# Patient Record
Sex: Male | Born: 1960 | Race: White | Hispanic: No | Marital: Married | Smoking: Never smoker
Health system: Southern US, Community
[De-identification: ages and names within clinical notes are randomized; demographics above are authoritative.]

## PROBLEM LIST (undated history)

## (undated) DIAGNOSIS — J45909 Unspecified asthma, uncomplicated: Secondary | ICD-10-CM

## (undated) DIAGNOSIS — M7918 Myalgia, other site: Secondary | ICD-10-CM

## (undated) DIAGNOSIS — T7840XA Allergy, unspecified, initial encounter: Secondary | ICD-10-CM

## (undated) DIAGNOSIS — L4050 Arthropathic psoriasis, unspecified: Secondary | ICD-10-CM

## (undated) DIAGNOSIS — G4700 Insomnia, unspecified: Secondary | ICD-10-CM

## (undated) DIAGNOSIS — E785 Hyperlipidemia, unspecified: Secondary | ICD-10-CM

## (undated) DIAGNOSIS — M1990 Unspecified osteoarthritis, unspecified site: Secondary | ICD-10-CM

## (undated) DIAGNOSIS — N419 Inflammatory disease of prostate, unspecified: Secondary | ICD-10-CM

## (undated) DIAGNOSIS — G43909 Migraine, unspecified, not intractable, without status migrainosus: Secondary | ICD-10-CM

## (undated) DIAGNOSIS — H269 Unspecified cataract: Secondary | ICD-10-CM

## (undated) DIAGNOSIS — K219 Gastro-esophageal reflux disease without esophagitis: Secondary | ICD-10-CM

## (undated) DIAGNOSIS — Z860100 Personal history of colon polyps, unspecified: Secondary | ICD-10-CM

## (undated) DIAGNOSIS — F419 Anxiety disorder, unspecified: Secondary | ICD-10-CM

## (undated) DIAGNOSIS — M545 Low back pain, unspecified: Secondary | ICD-10-CM

## (undated) DIAGNOSIS — G2581 Restless legs syndrome: Secondary | ICD-10-CM

## (undated) DIAGNOSIS — F411 Generalized anxiety disorder: Secondary | ICD-10-CM

## (undated) HISTORY — DX: Unspecified osteoarthritis, unspecified site: M19.90

## (undated) HISTORY — DX: Arthropathic psoriasis, unspecified: L40.50

## (undated) HISTORY — DX: Personal history of colon polyps, unspecified: Z86.0100

## (undated) HISTORY — DX: Insomnia, unspecified: G47.00

## (undated) HISTORY — PX: UPPER GASTROINTESTINAL ENDOSCOPY: SHX188

## (undated) HISTORY — DX: Personal history of colonic polyps: Z86.010

## (undated) HISTORY — DX: Low back pain: M54.5

## (undated) HISTORY — DX: Allergy, unspecified, initial encounter: T78.40XA

## (undated) HISTORY — DX: Migraine, unspecified, not intractable, without status migrainosus: G43.909

## (undated) HISTORY — DX: Low back pain, unspecified: M54.50

## (undated) HISTORY — DX: Gastro-esophageal reflux disease without esophagitis: K21.9

## (undated) HISTORY — DX: Inflammatory disease of prostate, unspecified: N41.9

## (undated) HISTORY — DX: Unspecified cataract: H26.9

## (undated) HISTORY — DX: Hyperlipidemia, unspecified: E78.5

## (undated) HISTORY — PX: COLONOSCOPY: SHX174

## (undated) HISTORY — DX: Restless legs syndrome: G25.81

## (undated) HISTORY — DX: Anxiety disorder, unspecified: F41.9

## (undated) HISTORY — DX: Generalized anxiety disorder: F41.1

## (undated) HISTORY — PX: OTHER SURGICAL HISTORY: SHX169

## (undated) HISTORY — DX: Myalgia, other site: M79.18

---
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
## 2002-03-16 ENCOUNTER — Encounter: Payer: Self-pay | Admitting: Urology

## 2002-03-16 ENCOUNTER — Encounter: Admission: RE | Admit: 2002-03-16 | Discharge: 2002-03-16 | Payer: Self-pay | Admitting: Urology

## 2002-10-26 ENCOUNTER — Encounter: Payer: Self-pay | Admitting: Gastroenterology

## 2002-10-26 ENCOUNTER — Ambulatory Visit (HOSPITAL_COMMUNITY): Admission: RE | Admit: 2002-10-26 | Discharge: 2002-10-26 | Payer: Self-pay | Admitting: Gastroenterology

## 2004-07-06 ENCOUNTER — Ambulatory Visit: Payer: Self-pay | Admitting: Pulmonary Disease

## 2004-07-29 ENCOUNTER — Ambulatory Visit: Payer: Self-pay | Admitting: Pulmonary Disease

## 2004-11-17 ENCOUNTER — Ambulatory Visit: Payer: Self-pay | Admitting: Pulmonary Disease

## 2004-12-04 ENCOUNTER — Ambulatory Visit: Payer: Self-pay | Admitting: Internal Medicine

## 2005-07-29 ENCOUNTER — Ambulatory Visit: Payer: Self-pay | Admitting: Pulmonary Disease

## 2005-10-01 ENCOUNTER — Ambulatory Visit: Payer: Self-pay | Admitting: Pulmonary Disease

## 2006-09-12 ENCOUNTER — Ambulatory Visit: Payer: Self-pay | Admitting: Pulmonary Disease

## 2006-09-23 ENCOUNTER — Ambulatory Visit: Payer: Self-pay | Admitting: Cardiovascular Disease

## 2006-12-08 ENCOUNTER — Ambulatory Visit: Payer: Self-pay | Admitting: Gastroenterology

## 2006-12-14 ENCOUNTER — Ambulatory Visit: Payer: Self-pay | Admitting: Gastroenterology

## 2006-12-27 ENCOUNTER — Ambulatory Visit: Payer: Self-pay | Admitting: Gastroenterology

## 2006-12-29 ENCOUNTER — Ambulatory Visit: Payer: Self-pay | Admitting: Gastroenterology

## 2007-01-26 ENCOUNTER — Ambulatory Visit: Payer: Self-pay | Admitting: Gastroenterology

## 2007-02-08 ENCOUNTER — Ambulatory Visit: Payer: Self-pay | Admitting: Internal Medicine

## 2007-02-08 LAB — CONVERTED CEMR LAB
ALT: 30 units/L (ref 0–53)
AST: 27 units/L (ref 0–37)
Albumin: 4.5 g/dL (ref 3.5–5.2)
Alkaline Phosphatase: 59 units/L (ref 39–117)
BUN: 13 mg/dL (ref 6–23)
Basophils Absolute: 0 10*3/uL (ref 0.0–0.1)
Basophils Relative: 0.5 % (ref 0.0–1.0)
Bilirubin Urine: NEGATIVE
Bilirubin, Direct: 0.1 mg/dL (ref 0.0–0.3)
CO2: 31 meq/L (ref 19–32)
Calcium: 9.6 mg/dL (ref 8.4–10.5)
Chloride: 104 meq/L (ref 96–112)
Cholesterol: 230 mg/dL (ref 0–200)
Creatinine, Ser: 0.9 mg/dL (ref 0.4–1.5)
Direct LDL: 176.4 mg/dL
Eosinophils Absolute: 0.4 10*3/uL (ref 0.0–0.6)
Eosinophils Relative: 7.3 % — ABNORMAL HIGH (ref 0.0–5.0)
GFR calc Af Amer: 117 mL/min
GFR calc non Af Amer: 97 mL/min
Glucose, Bld: 107 mg/dL — ABNORMAL HIGH (ref 70–99)
HCT: 46 % (ref 39.0–52.0)
HDL: 52.2 mg/dL (ref >39.0)
Hemoglobin, Urine: NEGATIVE
Hemoglobin: 16.1 g/dL (ref 13.0–17.0)
Ketones, ur: NEGATIVE mg/dL
Leukocytes, UA: NEGATIVE
Lymphocytes Relative: 37.7 % (ref 12.0–46.0)
MCHC: 34.9 g/dL (ref 30.0–36.0)
MCV: 91.6 fL (ref 78.0–100.0)
Monocytes Absolute: 0.3 10*3/uL (ref 0.2–0.7)
Monocytes Relative: 6.3 % (ref 3.0–11.0)
Neutro Abs: 2.7 10*3/uL (ref 1.4–7.7)
Neutrophils Relative %: 48.2 % (ref 43.0–77.0)
Nitrite: NEGATIVE
PSA: 1.31 ng/mL (ref 0.10–4.00)
Platelets: 224 10*3/uL (ref 150–400)
Potassium: 4 meq/L (ref 3.5–5.1)
RBC: 5.02 M/uL (ref 4.22–5.81)
RDW: 11.9 % (ref 11.5–14.6)
Sodium: 141 meq/L (ref 135–145)
Specific Gravity, Urine: 1.01 (ref 1.000–1.03)
TSH: 1.57 microintl units/mL (ref 0.35–5.50)
Total Bilirubin: 0.9 mg/dL (ref 0.3–1.2)
Total CHOL/HDL Ratio: 4.4
Total Protein, Urine: NEGATIVE mg/dL
Total Protein: 7.3 g/dL (ref 6.0–8.3)
Triglycerides: 65 mg/dL (ref 0–149)
Urine Glucose: NEGATIVE mg/dL
Urobilinogen, UA: 0.2 (ref 0.0–1.0)
VLDL: 13 mg/dL (ref 0–40)
WBC: 5.5 10*3/uL (ref 4.5–10.5)
pH: 6.5 (ref 5.0–8.0)

## 2007-02-23 ENCOUNTER — Ambulatory Visit: Payer: Self-pay | Admitting: Gastroenterology

## 2007-03-07 ENCOUNTER — Encounter: Payer: Self-pay | Admitting: Gastroenterology

## 2007-03-09 ENCOUNTER — Ambulatory Visit: Payer: Self-pay | Admitting: Gastroenterology

## 2007-03-22 ENCOUNTER — Encounter: Payer: Self-pay | Admitting: Internal Medicine

## 2007-03-22 ENCOUNTER — Ambulatory Visit: Payer: Self-pay | Admitting: Internal Medicine

## 2007-03-22 DIAGNOSIS — E785 Hyperlipidemia, unspecified: Secondary | ICD-10-CM | POA: Insufficient documentation

## 2007-03-22 DIAGNOSIS — M545 Low back pain, unspecified: Secondary | ICD-10-CM | POA: Insufficient documentation

## 2007-03-22 DIAGNOSIS — E7800 Pure hypercholesterolemia, unspecified: Secondary | ICD-10-CM | POA: Insufficient documentation

## 2007-03-22 DIAGNOSIS — F411 Generalized anxiety disorder: Secondary | ICD-10-CM

## 2007-03-22 HISTORY — DX: Generalized anxiety disorder: F41.1

## 2007-05-02 ENCOUNTER — Ambulatory Visit: Payer: Self-pay | Admitting: Internal Medicine

## 2007-05-02 LAB — CONVERTED CEMR LAB
ALT: 34 units/L (ref 0–53)
AST: 28 units/L (ref 0–37)
Albumin: 4.2 g/dL (ref 3.5–5.2)
Alkaline Phosphatase: 49 units/L (ref 39–117)
Bilirubin, Direct: 0.1 mg/dL (ref 0.0–0.3)
Cholesterol: 173 mg/dL (ref 0–200)
HDL: 54.5 mg/dL (ref >39.0)
LDL Cholesterol: 106 mg/dL — ABNORMAL HIGH (ref 0–99)
Total Bilirubin: 1.1 mg/dL (ref 0.3–1.2)
Total CHOL/HDL Ratio: 3.2
Total Protein: 6.8 g/dL (ref 6.0–8.3)
Triglycerides: 61 mg/dL (ref 0–149)
VLDL: 12 mg/dL (ref 0–40)

## 2007-05-09 ENCOUNTER — Telehealth (INDEPENDENT_AMBULATORY_CARE_PROVIDER_SITE_OTHER): Payer: Self-pay | Admitting: *Deleted

## 2007-05-10 ENCOUNTER — Ambulatory Visit: Payer: Self-pay | Admitting: Internal Medicine

## 2007-05-23 ENCOUNTER — Ambulatory Visit: Payer: Self-pay | Admitting: Gastroenterology

## 2007-06-15 HISTORY — PX: OTHER SURGICAL HISTORY: SHX169

## 2007-06-26 ENCOUNTER — Encounter: Payer: Self-pay | Admitting: Internal Medicine

## 2007-06-30 ENCOUNTER — Encounter (INDEPENDENT_AMBULATORY_CARE_PROVIDER_SITE_OTHER): Payer: Self-pay | Admitting: *Deleted

## 2007-06-30 ENCOUNTER — Telehealth (INDEPENDENT_AMBULATORY_CARE_PROVIDER_SITE_OTHER): Payer: Self-pay | Admitting: *Deleted

## 2007-07-20 ENCOUNTER — Ambulatory Visit: Payer: Self-pay | Admitting: Internal Medicine

## 2007-07-21 ENCOUNTER — Telehealth (INDEPENDENT_AMBULATORY_CARE_PROVIDER_SITE_OTHER): Payer: Self-pay | Admitting: *Deleted

## 2007-07-28 ENCOUNTER — Encounter: Payer: Self-pay | Admitting: Internal Medicine

## 2007-08-03 ENCOUNTER — Ambulatory Visit: Payer: Self-pay | Admitting: Hematology & Oncology

## 2007-08-14 ENCOUNTER — Encounter: Payer: Self-pay | Admitting: Internal Medicine

## 2007-08-14 LAB — CBC WITH DIFFERENTIAL/PLATELET
BASO%: 0.6 % (ref 0.0–2.0)
Basophils Absolute: 0 10*3/uL (ref 0.0–0.1)
EOS%: 0.6 % (ref 0.0–7.0)
Eosinophils Absolute: 0 10*3/uL (ref 0.0–0.5)
HCT: 46.6 % (ref 38.7–49.9)
HGB: 16.5 g/dL (ref 13.0–17.1)
LYMPH%: 45.6 % (ref 14.0–48.0)
MCH: 32.2 pg (ref 28.0–33.4)
MCHC: 35.3 g/dL (ref 32.0–35.9)
MCV: 91 fL (ref 81.6–98.0)
MONO#: 0.3 10*3/uL (ref 0.1–0.9)
MONO%: 6.1 % (ref 0.0–13.0)
NEUT#: 2.2 10*3/uL (ref 1.5–6.5)
NEUT%: 47.1 % (ref 40.0–75.0)
Platelets: 207 10*3/uL (ref 145–400)
RBC: 5.12 10*6/uL (ref 4.20–5.71)
RDW: 12.9 % (ref 11.2–14.6)
WBC: 4.7 10*3/uL (ref 4.0–10.0)
lymph#: 2.2 10*3/uL (ref 0.9–3.3)

## 2007-08-14 LAB — MORPHOLOGY: PLT EST: ADEQUATE

## 2007-08-14 LAB — CHCC SMEAR

## 2007-08-14 LAB — ERYTHROCYTE SEDIMENTATION RATE: Sed Rate: 2 mm/hr (ref 0–20)

## 2007-08-23 ENCOUNTER — Telehealth (INDEPENDENT_AMBULATORY_CARE_PROVIDER_SITE_OTHER): Payer: Self-pay | Admitting: *Deleted

## 2007-09-04 ENCOUNTER — Encounter: Payer: Self-pay | Admitting: Internal Medicine

## 2007-09-08 ENCOUNTER — Ambulatory Visit: Payer: Self-pay | Admitting: Gastroenterology

## 2007-09-29 ENCOUNTER — Encounter: Payer: Self-pay | Admitting: Internal Medicine

## 2007-09-29 ENCOUNTER — Ambulatory Visit: Payer: Self-pay | Admitting: Gastroenterology

## 2007-09-29 ENCOUNTER — Encounter: Payer: Self-pay | Admitting: Gastroenterology

## 2007-10-11 ENCOUNTER — Ambulatory Visit: Payer: Self-pay | Admitting: Internal Medicine

## 2007-10-30 ENCOUNTER — Telehealth (INDEPENDENT_AMBULATORY_CARE_PROVIDER_SITE_OTHER): Payer: Self-pay | Admitting: *Deleted

## 2007-11-03 ENCOUNTER — Ambulatory Visit: Payer: Self-pay | Admitting: Pulmonary Disease

## 2007-11-03 ENCOUNTER — Ambulatory Visit: Payer: Self-pay | Admitting: Internal Medicine

## 2007-11-09 ENCOUNTER — Encounter: Payer: Self-pay | Admitting: Pulmonary Disease

## 2007-11-17 ENCOUNTER — Ambulatory Visit: Payer: Self-pay | Admitting: Internal Medicine

## 2007-11-20 ENCOUNTER — Ambulatory Visit: Payer: Self-pay | Admitting: Cardiology

## 2008-04-05 ENCOUNTER — Telehealth (INDEPENDENT_AMBULATORY_CARE_PROVIDER_SITE_OTHER): Payer: Self-pay | Admitting: *Deleted

## 2008-04-05 ENCOUNTER — Ambulatory Visit: Payer: Self-pay | Admitting: Pulmonary Disease

## 2008-04-07 DIAGNOSIS — M1990 Unspecified osteoarthritis, unspecified site: Secondary | ICD-10-CM | POA: Insufficient documentation

## 2008-06-17 ENCOUNTER — Telehealth (INDEPENDENT_AMBULATORY_CARE_PROVIDER_SITE_OTHER): Payer: Self-pay | Admitting: *Deleted

## 2008-06-26 ENCOUNTER — Telehealth (INDEPENDENT_AMBULATORY_CARE_PROVIDER_SITE_OTHER): Payer: Self-pay | Admitting: *Deleted

## 2008-06-27 ENCOUNTER — Telehealth (INDEPENDENT_AMBULATORY_CARE_PROVIDER_SITE_OTHER): Payer: Self-pay | Admitting: *Deleted

## 2008-07-17 ENCOUNTER — Encounter: Payer: Self-pay | Admitting: Pulmonary Disease

## 2008-11-12 ENCOUNTER — Ambulatory Visit: Payer: Self-pay | Admitting: Pulmonary Disease

## 2008-11-12 ENCOUNTER — Telehealth (INDEPENDENT_AMBULATORY_CARE_PROVIDER_SITE_OTHER): Payer: Self-pay | Admitting: *Deleted

## 2008-11-29 ENCOUNTER — Telehealth (INDEPENDENT_AMBULATORY_CARE_PROVIDER_SITE_OTHER): Payer: Self-pay | Admitting: *Deleted

## 2008-12-10 ENCOUNTER — Ambulatory Visit: Payer: Self-pay | Admitting: Internal Medicine

## 2009-03-23 ENCOUNTER — Observation Stay (HOSPITAL_COMMUNITY): Admission: EM | Admit: 2009-03-23 | Discharge: 2009-03-25 | Payer: Self-pay | Admitting: Emergency Medicine

## 2009-03-24 ENCOUNTER — Ambulatory Visit: Payer: Self-pay | Admitting: Vascular Surgery

## 2009-03-24 ENCOUNTER — Encounter (INDEPENDENT_AMBULATORY_CARE_PROVIDER_SITE_OTHER): Payer: Self-pay | Admitting: Internal Medicine

## 2009-03-28 ENCOUNTER — Telehealth (INDEPENDENT_AMBULATORY_CARE_PROVIDER_SITE_OTHER): Payer: Self-pay | Admitting: *Deleted

## 2009-04-18 ENCOUNTER — Ambulatory Visit: Payer: Self-pay | Admitting: Pulmonary Disease

## 2009-04-19 LAB — CONVERTED CEMR LAB
ALT: 17 units/L (ref 0–53)
AST: 20 units/L (ref 0–37)
Albumin: 4.4 g/dL (ref 3.5–5.2)
Alkaline Phosphatase: 55 units/L (ref 39–117)
BUN: 13 mg/dL (ref 6–23)
Basophils Absolute: 0 10*3/uL (ref 0.0–0.1)
Basophils Relative: 0.5 % (ref 0.0–3.0)
Bilirubin Urine: NEGATIVE
Bilirubin, Direct: 0.1 mg/dL (ref 0.0–0.3)
CO2: 28 meq/L (ref 19–32)
Calcium: 9.4 mg/dL (ref 8.4–10.5)
Chloride: 107 meq/L (ref 96–112)
Cholesterol: 238 mg/dL — ABNORMAL HIGH (ref 0–200)
Creatinine, Ser: 0.9 mg/dL (ref 0.4–1.5)
Direct LDL: 170.1 mg/dL
Eosinophils Absolute: 0.2 10*3/uL (ref 0.0–0.7)
Eosinophils Relative: 3.5 % (ref 0.0–5.0)
GFR calc non Af Amer: 95.71 mL/min (ref >60)
Glucose, Bld: 97 mg/dL (ref 70–99)
HCT: 48.1 % (ref 39.0–52.0)
HDL: 61 mg/dL (ref >39.00)
Hemoglobin, Urine: NEGATIVE
Hemoglobin: 15.9 g/dL (ref 13.0–17.0)
Ketones, ur: NEGATIVE mg/dL
Leukocytes, UA: NEGATIVE
Lymphocytes Relative: 33.5 % (ref 12.0–46.0)
Lymphs Abs: 1.5 10*3/uL (ref 0.7–4.0)
MCHC: 33 g/dL (ref 30.0–36.0)
MCV: 98 fL (ref 78.0–100.0)
Monocytes Absolute: 0.3 10*3/uL (ref 0.1–1.0)
Monocytes Relative: 7 % (ref 3.0–12.0)
Neutro Abs: 2.6 10*3/uL (ref 1.4–7.7)
Neutrophils Relative %: 55.5 % (ref 43.0–77.0)
Nitrite: NEGATIVE
Platelets: 203 10*3/uL (ref 150.0–400.0)
Potassium: 4.4 meq/L (ref 3.5–5.1)
RBC: 4.91 M/uL (ref 4.22–5.81)
RDW: 12.1 % (ref 11.5–14.6)
Sodium: 143 meq/L (ref 135–145)
Specific Gravity, Urine: 1.01 (ref 1.000–1.030)
TSH: 1.35 microintl units/mL (ref 0.35–5.50)
Total Bilirubin: 1.1 mg/dL (ref 0.3–1.2)
Total CHOL/HDL Ratio: 4
Total Protein, Urine: NEGATIVE mg/dL
Total Protein: 6.5 g/dL (ref 6.0–8.3)
Triglycerides: 52 mg/dL (ref 0.0–149.0)
Urine Glucose: NEGATIVE mg/dL
Urobilinogen, UA: 0.2 (ref 0.0–1.0)
VLDL: 10.4 mg/dL (ref 0.0–40.0)
WBC: 4.6 10*3/uL (ref 4.5–10.5)
pH: 5.5 (ref 5.0–8.0)

## 2009-04-22 ENCOUNTER — Telehealth (INDEPENDENT_AMBULATORY_CARE_PROVIDER_SITE_OTHER): Payer: Self-pay | Admitting: *Deleted

## 2009-04-24 ENCOUNTER — Telehealth (INDEPENDENT_AMBULATORY_CARE_PROVIDER_SITE_OTHER): Payer: Self-pay | Admitting: *Deleted

## 2009-09-18 ENCOUNTER — Ambulatory Visit: Payer: Self-pay | Admitting: Pulmonary Disease

## 2009-10-02 ENCOUNTER — Encounter: Payer: Self-pay | Admitting: Adult Health

## 2009-10-02 ENCOUNTER — Ambulatory Visit: Payer: Self-pay | Admitting: Pulmonary Disease

## 2010-01-12 ENCOUNTER — Encounter: Admission: RE | Admit: 2010-01-12 | Discharge: 2010-01-12 | Payer: Self-pay | Admitting: Orthopedic Surgery

## 2010-02-23 ENCOUNTER — Ambulatory Visit: Payer: Self-pay | Admitting: Pulmonary Disease

## 2010-02-23 ENCOUNTER — Telehealth: Payer: Self-pay | Admitting: Gastroenterology

## 2010-02-23 LAB — CONVERTED CEMR LAB
Cholesterol, target level: 200 mg/dL
HDL goal, serum: 40 mg/dL
LDL Goal: 160 mg/dL

## 2010-02-24 ENCOUNTER — Encounter: Payer: Self-pay | Admitting: Adult Health

## 2010-02-24 LAB — CONVERTED CEMR LAB
ALT: 24 units/L (ref 0–53)
AST: 21 units/L (ref 0–37)
Albumin: 4 g/dL (ref 3.5–5.2)
Alkaline Phosphatase: 45 units/L (ref 39–117)
Amylase: 77 units/L (ref 27–131)
BUN: 18 mg/dL (ref 6–23)
Basophils Absolute: 0 10*3/uL (ref 0.0–0.1)
Basophils Relative: 0.2 % (ref 0.0–3.0)
Bilirubin Urine: NEGATIVE
Bilirubin, Direct: 0.1 mg/dL (ref 0.0–0.3)
CO2: 29 meq/L (ref 19–32)
Calcium: 9 mg/dL (ref 8.4–10.5)
Chloride: 104 meq/L (ref 96–112)
Creatinine, Ser: 1 mg/dL (ref 0.4–1.5)
Eosinophils Absolute: 0.2 10*3/uL (ref 0.0–0.7)
Eosinophils Relative: 4.6 % (ref 0.0–5.0)
GFR calc non Af Amer: 83.48 mL/min (ref >60)
Glucose, Bld: 92 mg/dL (ref 70–99)
H Pylori IgG: NEGATIVE
HCT: 41.4 % (ref 39.0–52.0)
Hemoglobin, Urine: NEGATIVE
Hemoglobin: 14.4 g/dL (ref 13.0–17.0)
Ketones, ur: NEGATIVE mg/dL
Leukocytes, UA: NEGATIVE
Lipase: 29 units/L (ref 11.0–59.0)
Lymphocytes Relative: 47.1 % — ABNORMAL HIGH (ref 12.0–46.0)
Lymphs Abs: 2 10*3/uL (ref 0.7–4.0)
MCHC: 34.8 g/dL (ref 30.0–36.0)
MCV: 94.2 fL (ref 78.0–100.0)
Monocytes Absolute: 0.3 10*3/uL (ref 0.1–1.0)
Monocytes Relative: 7.6 % (ref 3.0–12.0)
Neutro Abs: 1.7 10*3/uL (ref 1.4–7.7)
Neutrophils Relative %: 40.5 % — ABNORMAL LOW (ref 43.0–77.0)
Nitrite: NEGATIVE
Platelets: 201 10*3/uL (ref 150.0–400.0)
Potassium: 3.9 meq/L (ref 3.5–5.1)
RBC: 4.4 M/uL (ref 4.22–5.81)
RDW: 12.9 % (ref 11.5–14.6)
Sed Rate: 8 mm/hr (ref 0–22)
Sodium: 141 meq/L (ref 135–145)
Specific Gravity, Urine: 1.015 (ref 1.000–1.030)
TSH: 1.98 microintl units/mL (ref 0.35–5.50)
Total Bilirubin: 0.5 mg/dL (ref 0.3–1.2)
Total Protein, Urine: NEGATIVE mg/dL
Total Protein: 6.1 g/dL (ref 6.0–8.3)
Urine Glucose: NEGATIVE mg/dL
Urobilinogen, UA: 0.2 (ref 0.0–1.0)
WBC: 4.2 10*3/uL — ABNORMAL LOW (ref 4.5–10.5)
pH: 5.5 (ref 5.0–8.0)

## 2010-02-25 ENCOUNTER — Telehealth: Payer: Self-pay | Admitting: Gastroenterology

## 2010-02-26 ENCOUNTER — Encounter: Payer: Self-pay | Admitting: Adult Health

## 2010-04-28 ENCOUNTER — Telehealth: Payer: Self-pay | Admitting: Pulmonary Disease

## 2010-06-26 ENCOUNTER — Ambulatory Visit
Admission: RE | Admit: 2010-06-26 | Discharge: 2010-06-26 | Payer: Self-pay | Source: Home / Self Care | Attending: Pulmonary Disease | Admitting: Pulmonary Disease

## 2010-07-05 ENCOUNTER — Encounter: Payer: Self-pay | Admitting: Neurology

## 2010-07-12 LAB — CONVERTED CEMR LAB
ALT: 32 units/L (ref 0–53)
AST: 27 units/L (ref 0–37)
Albumin: 4.3 g/dL (ref 3.5–5.2)
Alkaline Phosphatase: 51 units/L (ref 39–117)
BUN: 13 mg/dL (ref 6–23)
Bacteria, UA: NEGATIVE
Basophils Absolute: 0 10*3/uL (ref 0.0–0.1)
Basophils Relative: 0.9 % (ref 0.0–3.0)
Bilirubin Urine: NEGATIVE
Bilirubin, Direct: 0.2 mg/dL (ref 0.0–0.3)
CO2: 29 meq/L (ref 19–32)
Calcium: 9.1 mg/dL (ref 8.4–10.5)
Chloride: 107 meq/L (ref 96–112)
Cholesterol: 176 mg/dL (ref 0–200)
Creatinine, Ser: 1 mg/dL (ref 0.4–1.5)
Crystals: NEGATIVE
Eosinophils Absolute: 0.1 10*3/uL (ref 0.0–0.7)
Eosinophils Relative: 2.7 % (ref 0.0–5.0)
GFR calc Af Amer: 103 mL/min
GFR calc non Af Amer: 85 mL/min
Glucose, Bld: 99 mg/dL (ref 70–99)
HCT: 48 % (ref 39.0–52.0)
HDL: 60.3 mg/dL (ref >39.0)
Hemoglobin, Urine: NEGATIVE
Hemoglobin: 16.4 g/dL (ref 13.0–17.0)
Ketones, ur: NEGATIVE mg/dL
LDL Cholesterol: 106 mg/dL — ABNORMAL HIGH (ref 0–99)
Leukocytes, UA: NEGATIVE
Lymphocytes Relative: 40 % (ref 12.0–46.0)
MCHC: 34.2 g/dL (ref 30.0–36.0)
MCV: 94.2 fL (ref 78.0–100.0)
Monocytes Absolute: 0.3 10*3/uL (ref 0.1–1.0)
Monocytes Relative: 7.3 % (ref 3.0–12.0)
Mucus, UA: NEGATIVE
Neutro Abs: 1.9 10*3/uL (ref 1.4–7.7)
Neutrophils Relative %: 49.1 % (ref 43.0–77.0)
Nitrite: NEGATIVE
PSA: 1.05 ng/mL (ref 0.10–4.00)
Platelets: 182 10*3/uL (ref 150–400)
Potassium: 3.9 meq/L (ref 3.5–5.1)
RBC / HPF: NONE SEEN
RBC: 5.09 M/uL (ref 4.22–5.81)
RDW: 12.2 % (ref 11.5–14.6)
Sodium: 142 meq/L (ref 135–145)
Specific Gravity, Urine: <=1.005 (ref 1.000–1.03)
TSH: 0.96 microintl units/mL (ref 0.35–5.50)
Total Bilirubin: 1 mg/dL (ref 0.3–1.2)
Total CHOL/HDL Ratio: 2.9
Total Protein, Urine: NEGATIVE mg/dL
Total Protein: 6.8 g/dL (ref 6.0–8.3)
Triglycerides: 50 mg/dL (ref 0–149)
Urine Glucose: NEGATIVE mg/dL
Urobilinogen, UA: 0.2 (ref 0.0–1.0)
VLDL: 10 mg/dL (ref 0–40)
WBC, UA: NONE SEEN cells/hpf
WBC: 3.9 10*3/uL — ABNORMAL LOW (ref 4.5–10.5)
pH: 6 (ref 5.0–8.0)

## 2010-07-14 NOTE — Progress Notes (Signed)
Summary: copy of labs and test  Phone Note Call from Patient Call back at Home Phone [phone]   Caller: Patient Call For: nadel Reason for Call: Talk to Nurse Summary of Call: email or print out of all his test for a fitness incentive program he has at work. Initial call taken by: Eugene Gavia,  April 24, 2009 1:21 PM  Follow-up for Phone Call        Spoke with pt.  He just wanted labs printed to pick up and take to his work.  Labs up front for him to pick up. Follow-up by: Vernie Murders,  April 24, 2009 2:39 PM

## 2010-07-14 NOTE — Assessment & Plan Note (Signed)
Summary: sinus infection/chest congestion/ewj   CC:  nasal congestion x 3 wks--draining down into chest--coughed up brown sputum this am--body aches.  History of Present Illness: 50  year old male with known history of bronchitis, never smoker,  04/05/08--follow up visit and CPX... he had an episode of asthmatic bronchitis earlier this year and required Pred + antibiotics to finally clear it- treated by TParrett,NP & DrJohn...  November 12, 2008-Complains of nasal congestion x 3 wks--draining down into chest--coughed up brown sputum this am--body aches, fever 100.0. Symptoms have been waxing and waning, worse last week. now running low grade fevers. Using cold meds w/ no help. Denies chest pain, dyspnea, orthopnea, hemoptysis,  n/v/d, edema, headache.        Medications Prior to Update: 1)  Aspirin Ec 81 Mg Tbec (Aspirin) .... Take 1 Tab By Mouth Once Daily.Marland KitchenMarland Kitchen 2)  Protonix 40 Mg  Tbec (Pantoprazole Sodium) .Marland Kitchen.. 1 By Mouth Once Daily As Needed 3)  Topamax 25 Mg Tabs (Topiramate) .... Take 3 Tabs At Bedtime 4)  Augmentin 875-125 Mg Tabs (Amoxicillin-Pot Clavulanate) .Marland Kitchen.. 1 By Mouth Two Times A Day 5)  Mucinex Dm Maximum Strength 60-1200 Mg Xr12h-Tab (Dextromethorphan-Guaifenesin) .Marland Kitchen.. 1 By Mouth Two Times A Day 6)  Zolpidem Tartrate 10 Mg Tabs (Zolpidem Tartrate) .Marland Kitchen.. 1 By Mouth At Bedtime As Needed  Current Medications (verified): 1)  Aspirin Ec 81 Mg Tbec (Aspirin) .... Take 1 Tab By Mouth Once Daily.Marland KitchenMarland Kitchen 2)  Protonix 40 Mg  Tbec (Pantoprazole Sodium) .Marland Kitchen.. 1 By Mouth Once Daily As Needed 3)  Topamax 25 Mg Tabs (Topiramate) .... Take 3 Tabs At Bedtime 4)  Augmentin 875-125 Mg Tabs (Amoxicillin-Pot Clavulanate) .Marland Kitchen.. 1 By Mouth Two Times A Day 5)  Mucinex Dm Maximum Strength 60-1200 Mg Xr12h-Tab (Dextromethorphan-Guaifenesin) .Marland Kitchen.. 1 By Mouth Two Times A Day  Allergies: 1)  ! Bactrim Ds (Sulfamethoxazole-Trimethoprim) 2)  ! Levaquin  Comments:  Nurse/Medical Assistant: The patient's  medications and allergies were reviewed with the patient and were updated in the Medication and Allergy Lists.  Past History:  Past Surgical History: Last updated: 04/05/2008 S/P bilat Inguinal herniorrhaphy in the 1960's S/P Appendectomy in 1967 S/P knee surgery x2 in 2004 & 2008 S/P bilat shoulder surgery in 2006 & 2007 S/P ganglion cyst surgery in 2009  Family History: Last updated: 03/22/2007 Family History of Colon CA 1st degree relative <60 Family History of CAD Male 1st degree relative <50 Family History High cholesterol Family History Hypertension  Social History: Last updated: 03/22/2007 Married Never Smoked Alcohol use-no  Risk Factors: Smoking Status: never (04/05/2008)  Past Medical History: Hx of ASTHMATIC BRONCHITIS, ACUTE (ICD-466.0) - AB exas earlier this year w/ Biaxin, Prednisone, Tussionex... finally resolved and doesn't require regular inhalers so far... he also had a sinusitis & dizzy eval by [facility] w/ CT Sinuses showing clear x sm amt of fluid in sphenoid sinus- he states all symptoms resolved w/ the Pred Rx...  HYPERLIPIDEMIA (ICD-272.4) - prev on Simvastatin 20mg /d, but pt stopped this on his own because it made him "feel bad"...  ~  FLP 4/07 showed TChol 238, TG 64, HDL 56, LDL 171... rec- try Simva20... pt dc'd on his own.  ~  FLP 8/08 showed TChol 230, TG 65, HDL 52, LDL 176... rec- vigorous diet efforts, refuses med.  ~  FLP 11/08 showed TChol 173, TG 61, HDL 55, LDL 106... fantastic job w/ diet, add exercise.  ~  FLP 10/09 =   GERD (ICD-530.81) - on PROTONIX 40mg /d  Prn... followed by Dorris Singh for GI... he was in a GI drug study of reflux- study has ended... last EGD was 9/08 showing esophagitis...  COLONIC POLYPS (ICD-211.3) - colonoscopy 4/09 by Dorris Singh showed divertics & 3mm polyp= hyperplastic... f/u planned ?10 yrs (there is a + FamHx of colon cancer in his mother).     Review of Systems      See HPI  Vital Signs:  Patient profile:    50 year old male Height:      70 inches Weight:      149.13 pounds BMI:     21.48 O2 Sat:      99 % on Room air Temp:     98.5 degrees F oral Pulse rate:   97 / minute BP sitting:   122 / 84  (left arm) Cuff size:   regular  Vitals Entered By: Boone Master CNA (November 12, 2008 4:32 PM)  O2 Sat at Rest %:  99 O2 Flow:  Room air CC: nasal congestion x 3 wks--draining down into chest--coughed up brown sputum this am--body aches Comments Medications reviewed with patient  Boone Master CNA  November 12, 2008 4:33 PM    Physical Exam  Additional Exam:  WD, WN, 50 y/o WM in NAD... GENERAL:  Alert & oriented; pleasant & cooperative... HEENT:  [place]/AT, EACs-clear, TMs-wnl, NOSE-mild redness, nontender sinus,  THROAT-clear & wnl. NECK:  Supple w/ fairROM; no JVD; normal carotid impulses w/o bruits; no thyromegaly or nodules palpated; no lymphadenopathy. CHEST:  CTA w/ no wheezing HEART:  Regular Rhythm; without murmurs/ rubs/ or gallops. ABDOMEN:  Soft & nontender; normal bowel sounds; no organomegaly or masses detected.  EXT: without deformities, mild arthritic changes; no varicose veins/ venous insuffic/ or edema.     Impression & Recommendations:  Problem # 1:  SINUSITIS, ACUTE (ICD-461.9)  Augmentin 875mg  two times a day for 10 days Mucinex DM two times a day as needed cough congestion  Increase fluids.  Saline nasal rinses.  Please contact office for sooner follow up if symptoms do not improve or worsen  His updated medication list for this problem includes:    Augmentin 875-125 Mg Tabs (Amoxicillin-pot clavulanate) .Marland Kitchen... 1 by mouth two times a day    Mucinex Dm Maximum Strength 60-1200 Mg Xr12h-tab (Dextromethorphan-guaifenesin) .Marland Kitchen... 1 by mouth two times a day  Orders: Est. Patient Level IV ([address])  Medications Added to Medication List This Visit: 1)  Augmentin 875-125 Mg Tabs (Amoxicillin-pot clavulanate) .Marland Kitchen.. 1 by mouth two times a day  Patient Instructions: 1)  Augmentin  875mg  two times a day for 10 days 2)  Mucinex DM two times a day as needed cough congestion  3)  Increase fluids.  4)  Saline nasal rinses.  5)  Please contact office for sooner follow up if symptoms do not improve or worsen  Prescriptions: AUGMENTIN 875-125 MG TABS (AMOXICILLIN-POT CLAVULANATE) 1 by mouth two times a day  #20 x 0   Entered and Authorized by:   Rubye Oaks NP   Signed by:   Rubye Oaks NP on 11/12/2008   Method used:   Electronically to        [facility]* (retail)       [address]       [place], [place]  [id number]       Ph: [phone]       Fax: [phone]   RxID:   [id number]

## 2010-07-14 NOTE — Assessment & Plan Note (Signed)
Summary: 2-3 WEEKS/APC   Chief Complaint:  2 week follow up, cough is better, having drainage down back of throat - yellow nasal drainage, headache, and body aches x6 days - finished pred taper.  History of Present Illness: follow up   50 year old male with known history of bronchitis, never smoker,  with 3  months of persistent cough. Seen by Dr. Jonny Ruiz given 2 antibiotics, zpack and biaxin. Seen 2 weeks ago for persistent cough and throat clearing, sinus drainage. Treated with prednsione taper, rhinitis/cough/reflux prevention.   Returns today with some improvement in cough, over last week increased sinus pressure, congestion-yellow mucus, headache, low grade fever. Denies chest pain, dyspnea, orthopnea, hemoptysis, fever, n/v/d, edema. CXR last ov was unremarkable.      Prior Medication List:  PROTONIX 40 MG  TBEC (PANTOPRAZOLE SODIUM) 1 by mouth once daily as needed TOPAMAX 50 MG  TABS (TOPIRAMATE) 1 and 1/2 by mouth At bedtime TESSALON 200 MG  CAPS (BENZONATATE) 1 by mouth three times a day as needed cough PREDNISONE 10 MG  TABS (PREDNISONE) 4 tabs for 2 days, then 3 tabs for 2 days, 2 tabs for 2 days, then 1 tab for 2 days, then stop TUSSIONEX PENNKINETIC ER 8-10 MG/5ML  LQCR (CHLORPHENIRAMINE-HYDROCODONE) 1 tsp every 12 hrs as needed cough   Current Allergies (reviewed today): ! BACTRIM DS (SULFAMETHOXAZOLE-TRIMETHOPRIM) ! 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)       DEGENERATION, CERVICAL DISC (ICD-722.4)       LOW BACK PAIN (ICD-724.2)       SYMPTOM, INSOMNIA NOS (ICD-780.52)       PROSTATITIS NOS (ICD-601.9)  HYPERLIPIDEMIA (ICD-272.4)       GERD (ICD-530.81)       COLONIC POLYPS, HX OF (ICD-V12.72)       ANXIETY (ICD-300.00)           Family History:    Reviewed history from 03/22/2007 and no changes required:       Family History of Colon CA 1st degree relative <60       Family History of CAD Male 1st degree relative <50       Family History High cholesterol       Family History Hypertension  Social History:    Reviewed history from 03/22/2007 and no changes required:       Married       Never Smoked       Alcohol use-no   Risk Factors: Tobacco use:  never Alcohol use:  no   Review of Systems      See HPI   Vital Signs:  Patient Profile:   50 Years Old Male Height:     70 inches Weight:      152 pounds O2 Sat:      98 % O2 treatment:    Room Air Temp:     99.2 degrees F oral Pulse rate:   101 / minute BP sitting:   122 / 72  (left arm) Cuff size:  regular  Vitals Entered By: Boone Master CNA (November 17, 2007 9:58 AM)             Is Patient Diabetic? No Comments Medications reviewed with patient Boone Master CNA  November 17, 2007 9:58 AM      Physical Exam  GENERAL:  A/Ox3; pleasant & cooperative.NAD HEENT:  [place]/AT, EOM-wnl, PERRLA, EACs-clear Nasal mucosa red/swollen, max tenderness NECK:  Supple w/ fair ROM; no JVD; normal carotid impulses w/o bruits; no thyromegaly or nodules palpated; no lymphadenopathy. CHEST:  Clear to P & A; w/o, wheezes/ rales/ or rhonchi. HEART:  RRR, no m/r/g  heard ABDOMEN:  Soft & nt; nml bowel sounds; no organomegaly or masses detected. EXT: Warm bilat,  no calf pain, edema, clubbing, pulses intact Skin: no rash/lesion      Impression & Recommendations:  Problem # 1:  SINUSITIS, ACUTE (ICD-461.9) CT sinus pending. REC:  Augmentin 875mg  two times a day for 10 days with food Delsym 2 tsp every 12 hr as needed cough Tessalon Perles three times a day as needed cough  Tramadol 50mg  every 4 hr as needed.   Increase fludis  Protonix 40mg  once daily for  Use sugarless candy to avoid throat clearing/cough.  Please contact office for sooner follow up if symptoms do not improve or worsen  follow up 2-3 weeks Dr. Kriste Basque.  call with results of CT sinus  His updated medication list for this problem includes:    Tessalon 200 Mg Caps (Benzonatate) .Marland Kitchen... 1 by mouth three times a day as needed cough    Augmentin 875-125 Mg Tabs (Amoxicillin-pot clavulanate) .Marland Kitchen... 1 by mouth two times a day with food  Orders: Radiology Referral (Radiology) Est. Patient Level IV (04540)   Medications Added to Medication List This Visit: 1)  Ultram 50 Mg Tabs (Tramadol hcl) .Marland Kitchen.. 1 by mouth every 4 hr as needed 2)  Augmentin 875-125 Mg Tabs (Amoxicillin-pot clavulanate) .Marland Kitchen.. 1 by mouth two times a day with food   Patient Instructions: 1)  Augmentin 875mg  two times a day for 10 days with food 2)  Delsym 2 tsp every 12 hr as needed cough 3)  Tessalon Perles three times a day as needed cough  4)  Tramadol 50mg  every 4 hr as needed.   5)  Increase fludis 6)  Protonix 40mg  once daily for  7)  Use sugarless candy to avoid throat clearing/cough.  8)  Please contact office for sooner follow up if symptoms do not improve or worsen  9)  follow up 2-3 weeks Dr. Kriste Basque.  10)  call with results of CT sinus    Prescriptions: ULTRAM 50 MG  TABS (TRAMADOL HCL) 1 by mouth every 4 hr as needed  #45 x 0   Entered and Authorized by:   Rubye Oaks NP   Signed by:   Tykira Wachs NP on 11/17/2007   Method used:   Electronically sent to ...       [facility]*       [address]       [place], [place]  [address]       Ph: [phone]       Fax: [phone]   RxID:   [id number] AUGMENTIN 875-125 MG  TABS (AMOXICILLIN-POT CLAVULANATE) 1 by mouth two times a day with food  #20 x 0   Entered and Authorized by:   Rubye Oaks NP   Signed by:   Rosalva Neary NP on 11/17/2007   Method  used:   Electronically sent to ...        [facility]*       [address]       [place], [place]  [id number]       Ph: [phone]       Fax: [phone]   RxID:   [id number]  ]

## 2010-07-14 NOTE — Assessment & Plan Note (Signed)
Summary: cpx/fasting/apc   CC:  Yearly ROV & CPX....  History of Present Illness: 50 y/o WM here for a follow up visit and CPX...  he has multiple medical problems as noted below...     ~  April 18, 2009:  he was hosp 10/10 for 2d for "syncope vs seizure" w/ neg eval & episode believed to be due to meds for migraine headaches (DrHagen at the [facility]) + Vicodin her received from DrNorris after right rotator cuff surg 8/10... no other issues this past yr... he's already had his 2010 flu shot & we will give him a Tdap booster today.    Current Problem List:  PHYSICAL EXAMINATION (ICD-V70.0) - he takes ASA 81mg /d...  ~  Colon: mother had colon cancer- colonoscopy 4/09 by Dorris Singh w/ 3mm hyperpl polyp...  ~  Immunizations:  had 2010 Flu vaccine already... given Tdap today- 11/10...  Hx of ASTHMATIC BRONCHITIS, ACUTE (ICD-466.0) - AB exas in 2009 w/ Biaxin, Prednisone, Tussionex... finally resolved and doesn't require regular inhalers so far... he also had a sinusitis & dizzy eval by [facility] w/ CT Sinuses showing clear x sm amt of fluid in sphenoid sinus- he states all symptoms resolved w/ the Pred Rx...  HYPERLIPIDEMIA (ICD-272.4) - prev on Simvastatin 20mg /d, but pt stopped this on his own because it made him "feel bad" & now on diet alone...  ~  FLP 4/07 showed TChol 238, TG 64, HDL 56, LDL 171... rec- try Simva20... pt dc'd on his own.  ~  FLP 8/08 showed TChol 230, TG 65, HDL 52, LDL 176... rec- vigorous diet efforts, refuses med.  ~  FLP 11/08 showed TChol 173, TG 61, HDL 55, LDL 106... fantastic job w/ diet, add exercise.  ~  FLP 10/09 showed TChol 176, TG 50, HDL 60, LDL 106  ~  FLP 11/10 showed TChol 238, TG 52, HDL 61, LDL 170... what happened? he still prefers diet Rx.  GERD (ICD-530.81) - on PROTONIX 40mg /d Prn... followed by Dorris Singh for GI... he was in a GI drug study of reflux- study has ended... last EGD was 9/08 showing esophagitis...  COLONIC POLYPS (ICD-211.3) -  colonoscopy 4/09 by Dorris Singh showed divertics & 3mm polyp= hyperplastic... f/u planned ?10 yrs (there is a + FamHx of colon cancer in his mother).  ? of PROSTATITIS NOS (ICD-601.9) - treated by [facility] in 1988 for prostatitis... pt recently had f/u w/ Urology and everything was fine...   DEGENERATIVE JOINT DISEASE (ICD-715.90) - prev evals by Drs Bary Leriche and [place] w/ surgery to both shoulders, knee x2, and wrist... he has also seen a chiropractor- DrWillen, w/ CSpine problems...  LOW BACK PAIN (ICD-724.2) MYOFASCIAL PAIN SYNDROME (ICD-729.1) - he has seen DrZeminski in the past for ? fibromyalgia and lumbar DDD  HEADACHE (ICD-784.0) - [facility]... felt to be migraine headaches and treated w/ TOPAMAX, BACLOFEN, RELPAX, Tylenol & Ibuprofen for Prn use... Note: also saw DrStiefel in 1983 for severe incapacitating headaches...  ~  Hosp 10/10 w/ altered mental status prob related to meds + Vicodin given for shoulder surg... resolved off meds & he is due for f/u w/ DrHagen soon to re-assess what's needed for his migraines.  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  ~  EEG 10/10 was normal/ neg...  ~  MRI/ MRA Brain 10/10 showed NAD, scattered subcortical T2  hyperintensities are sl greater in number than expected for age, MRA= normal.   ~  CT CSpine 10/10 shows DDD & spondylosis lower CSpine...  ~  CDopplers 10/10 were WNL...  ~  2DEcho 10/10 was normal w/ EF= 60-65% & no wall motion abn...  ANXIETY (ICD-300.00)  SYMPTOM, INSOMNIA NOS (ICD-780.52)    Allergies: 1)  ! Bactrim Ds (Sulfamethoxazole-Trimethoprim) 2)  ! Levaquin  Comments:  Nurse/Medical Assistant: The patient's medications and allergies were reviewed with the patient and were updated in the Medication and Allergy Lists.  Past History:  Past Medical History: ALLERGY  (ICD-995.3) PHYSICAL EXAMINATION (ICD-V70.0) Hx of ASTHMATIC BRONCHITIS, ACUTE (ICD-466.0) HYPERLIPIDEMIA (ICD-272.4) GERD (ICD-530.81) COLONIC POLYPS (ICD-211.3) ? of PROSTATITIS NOS (ICD-601.9) DEGENERATIVE JOINT DISEASE (ICD-715.90) LOW BACK PAIN (ICD-724.2) MYOFASCIAL PAIN SYNDROME (ICD-729.1) HEADACHE (ICD-784.0) Hx of ALTERED MENTAL STATUS (ICD-780.97) ANXIETY (ICD-300.00) SYMPTOM, INSOMNIA NOS (ICD-780.52)  Past Surgical History: S/P bilat Inguinal herniorrhaphy in the 1960's S/P Appendectomy in 1967 S/P knee surgery x2 in 2004 & 2008 S/P bilat shoulder surgery in 2006 & 2007 & 2010 S/P ganglion cyst surgery in 2009  Family History: Reviewed history from 03/22/2007 and no changes required. Family History of Colon CA 1st degree relative <60 Family History of CAD Male 1st degree relative <50 Family History High cholesterol Family History Hypertension  Social History: Reviewed history from 12/10/2008 and no changes required. 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  The patient denies fever, chills, sweats, anorexia, weakness, malaise, weight loss, sleep disorder, blurring, diplopia, eye irritation, eye discharge, vision loss, eye pain, photophobia, earache, ear discharge, tinnitus, decreased hearing, nasal congestion, nosebleeds, sore throat, hoarseness, chest pain, palpitations, syncope, dyspnea on exertion, orthopnea, PND, peripheral edema, cough, dyspnea at rest, excessive sputum, hemoptysis, wheezing, pleurisy, nausea, vomiting, diarrhea, constipation, change in bowel habits, abdominal pain, melena, hematochezia, jaundice, gas/bloating, indigestion/heartburn, dysphagia, odynophagia, dysuria, hematuria, urinary frequency, urinary hesitancy, nocturia, incontinence, back pain, joint pain, joint swelling, muscle cramps, muscle weakness, stiffness, arthritis, sciatica, restless  legs, leg pain at night, leg pain with exertion, rash, itching, dryness, suspicious lesions, paralysis, paresthesias, seizures, tremors, vertigo, transient blindness, frequent falls, frequent headaches, difficulty walking, depression, anxiety, memory loss, confusion, cold intolerance, heat intolerance, polydipsia, polyphagia, polyuria, unusual weight change, abnormal bruising, bleeding, enlarged lymph nodes, urticaria, allergic rash, hay fever, and recurrent infections.    Vital Signs:  Patient profile:   50 year old male Height:      69 inches Weight:      152.13 pounds BMI:     22.55 O2 Sat:      98 % on Room air Temp:     98.0 degrees F oral Pulse rate:   80 / minute BP sitting:   110 / 88  (left arm) Cuff size:   regular  Vitals Entered By: Marijo File CMA (April 18, 2009 8:57 AM)  O2 Sat at Rest %:  98 O2 Flow:  Room air CC: Yearly ROV & CPX... Is Patient Diabetic? No Pain Assessment Patient in pain? no      Comments no changes in meds   Physical Exam  Additional Exam:  WD, WN, 50 y/o WM in NAD... GENERAL:  Alert & oriented; pleasant & cooperative... HEENT:  [place]/AT, EOM-wnl, PERRLA, [place]-benign, EACs-clear, TMs-wnl, NOSE-clear, THROAT-clear & wnl. NECK:  Supple w/ fairROM; no JVD; normal carotid impulses w/o bruits; no thyromegaly  or nodules palpated; no lymphadenopathy. CHEST:  Clear to P & A; without wheezes/ rales/ or rhonchi. HEART:  Regular Rhythm; without murmurs/ rubs/ or gallops. ABDOMEN:  Soft & nontender; normal bowel sounds; no organomegaly or masses detected. RECTAL:  Neg - prostate 2+ & nontender w/o nodules; stool hematest neg. EXT: without deformities, mild arthritic changes; no varicose veins/ venous insuffic/ or edema. scars of shoulder surgeries... NEURO:  CN's intact; motor testing normal; sensory testing normal; gait normal & balance OK. DERM:  No lesions noted; no rash etc...     CXR  Procedure date:  04/18/2009  Findings:      CHEST -  2 VIEW   Comparison: 11/03/2007   Findings: Heart and mediastinal contours are within normal limits. The lung fields are clear with no evidence for focal infiltrate or congestive failure.  Mild bilateral apical pleural thickening is seen.  No pleural fluid is noted.  Bony structures are intact.   IMPRESSION: Stable cardiopulmonary appearance with no acute abnormality noted.   Read By:  Bertha Stakes,  M.D.      Comments:      I have [personally reviewed this CXR on [employer]... SN   MISC. Report  Procedure date:  04/18/2009  Findings:      Tests: (1) Lipid Panel (LIPID)   Cholesterol          [H]  238 mg/dL                   8-119   Triglycerides             52.0 mg/dL                  1.4-782.9   HDL                       56.21 mg/dL                 >30.86   VLDL Cholesterol          10.4 mg/dL                  5.7-84.6 Cholesterol LDL - Direct                             170.1 mg/dL  Tests: (2) BMP (METABOL)   Sodium                    143 mEq/L                   135-145   Potassium                 4.4 mEq/L                   3.5-5.1   Chloride                  107 mEq/L                   96-112   Carbon Dioxide            28 mEq/L                    19-32   Glucose                   97 mg/dL  70-99   BUN                       13 mg/dL                    3-08   Creatinine                0.9 mg/dL                   6.5-7.8   Calcium                   9.4 mg/dL                   4.6-96.2   GFR                       95.71 mL/min                >60  Tests: (3) CBC Platelet w/Diff (CBCD)   White Cell Count          4.6 K/uL                    4.5-10.5   Red Cell Count            4.91 Mil/uL                 4.22-5.81   Hemoglobin                15.9 g/dL                   95.2-84.1   Hematocrit                48.1 %                      39.0-52.0   MCV                       98.0 fl                     78.0-100.0  Platelet Count            203.0 K/uL                   150.0-400.0   Neutrophil %              55.5 %                      43.0-77.0   Lymphocyte %              33.5 %                      12.0-46.0   Monocyte %                7.0 %                       3.0-12.0   Eosinophils%              3.5 %                       0.0-5.0   Basophils %               0.5 %  Comments:      Tests: (4) Hepatic/Liver Function Panel (HEPATIC)   Total Bilirubin           1.1 mg/dL                   1.6-1.0   Direct Bilirubin          0.1 mg/dL                   9.6-0.4   Alkaline Phosphatase      55 U/L                      39-117   AST                       20 U/L                      0-37   ALT                       17 U/L                      0-53   Total Protein             6.5 g/dL                    5.4-0.9   Albumin                   4.4 g/dL                    8.1-1.9  Tests: (5) TSH (TSH)   FastTSH                   1.35 uIU/mL                 0.35-5.50  Tests: (6) UDip w/Micro (URINE)   Color                     LT. YELLOW   Clarity                   CLEAR                       Clear   Specific Gravity          1.010                       1.000 - 1.030   Urine Ph                  5.5                         5.0-8.0   Protein                   NEGATIVE                    Negative   Urine Glucose             NEGATIVE                    Negative   Ketones  NEGATIVE                    Negative   Urine Bilirubin           NEGATIVE                    Negative   Blood                     NEGATIVE                    Negative   Urobilinogen              0.2                         0.0 - 1.0   Leukocyte Esterace        NEGATIVE                    Negative   Nitrite                   NEGATIVE                    Negative    Impression & Recommendations:  Problem # 1:  PHYSICAL EXAMINATION (ICD-V70.0)  Orders: T-2 View CXR (71020TC) Venipuncture (98119) TLB-Lipid Panel (80061-LIPID) TLB-BMP (Basic Metabolic  Panel-BMET) (80048-METABOL) TLB-CBC Platelet - w/Differential (85025-CBCD) TLB-Hepatic/Liver Function Pnl (80076-HEPATIC) TLB-TSH (Thyroid Stimulating Hormone) (84443-TSH) TLB-Udip w/ Micro (81001-URINE)  Problem # 2:  Hx of ASTHMATIC BRONCHITIS, ACUTE (ICD-466.0) No problem, w/o exac...  Problem # 3:  HYPERLIPIDEMIA (ICD-272.4) FLP not as good this yr... I rec restarting statin medication Simva20 or Prav40, but he wants diet alone & doesn't want to restart meds... discussed w/ pt & he will let us know if he changes his mind...  Problem # 4:  GERD (ICD-530.81) GI stable-  same Rx. His updated medication list for this problem includes:    Protonix 40 Mg Tbec (Pantoprazole sodium) .Marland Kitchen... 1 by mouth once daily as needed  Problem # 5:  DEGENERATIVE JOINT DISEASE (ICD-715.90) Followed by drNorris w/ shoulder surg... His updated medication list for this problem includes:    Aspirin Ec 81 Mg Tbec (Aspirin) .Marland Kitchen... Take 1 tab by mouth once daily...  Problem # 6:  HEADACHE (ICD-784.0) He has a follow up w/ [facility]Hagen at the [facility] soon... currently he is off all meds since his Oct10 hosp for altered mental status which appeared to be from mult meds as noted... His updated medication list for this problem includes:    Aspirin Ec 81 Mg Tbec (Aspirin) .Marland Kitchen... Take 1 tab by mouth once daily...  Complete Medication List: 1)  Aspirin Ec 81 Mg Tbec (Aspirin) .... Take 1 tab by mouth once daily.Marland KitchenMarland Kitchen 2)  Protonix 40 Mg Tbec (Pantoprazole sodium) .Marland Kitchen.. 1 by mouth once daily as needed  Other Orders: Prescription Created Electronically [phone]) Tdap => 31yrs IM (95621) Admin 1st Vaccine (30865)  Patient Instructions: 1)  Today we updated your med list- see below.... 2)  We refilled your Protonix medication.Marland KitchenMarland Kitchen 3)  [facility] to see what adjustments they can make to your headache regimen... 4)  Today we did your follow up CXR & Fasting blood work... please call the "[facility]" in a few days for your lab results.Marland KitchenMarland Kitchen 5)  We also gave your the  Tdap booster vaccination today... 6)  You have already received the 2010 flu vaccine... 7)  Call for any problems... remember to ask for DrNadel's nurse, Leigh...  8)  Please schedule a follow-up appointment in 1 year. Prescriptions: PROTONIX 40 MG  TBEC (PANTOPRAZOLE SODIUM) 1 by mouth once daily as needed Brand medically necessary #30 x prn   Entered and Authorized by:   Michele Mcalpine MD   Signed by:   Michele Mcalpine MD on 04/18/2009   Method used:   Print then Give to Patient   RxID:   [id number]    Immunizations Administered:  Tetanus Vaccine:    Vaccine Type: Tdap    Site: left deltoid    Mfr: boostrix    Dose: 0.5 ml    Route: IM    Given by: Marijo File CMA    Exp. Date: 04/17/2011    Lot #: [id number]    VIS given: 05/02/07 version given April 18, 2009.

## 2010-07-14 NOTE — Progress Notes (Signed)
Summary: TALK TO NURSE  Phone Note Call from Patient   Caller: Patient Call For: NADEL Summary of Call: HAVE QUESTION ABOUT UP COMING CPX Initial call taken by: Rickard Patience,  March 28, 2009 3:41 PM  Follow-up for Phone Call        Spoke with pt.  He wanted SN to be made aware that he was seen in the ER at [facility] last wkend and had EKG, brain MRI, and "lots of blood work".  Pt has cpx in November and does'nt want SN to order anything that he has already had done.  Just an FYI. Follow-up by: Vernie Murders,  March 28, 2009 3:53 PM

## 2010-07-14 NOTE — Assessment & Plan Note (Signed)
Summary: Acute NP office visit - diarrhea   CC:  nausea, diarrhea, body aches , and weak for 3 days. Marland Kitchen  History of Present Illness: 50  year old male with known history of bronchitis, never smoker,  04/05/08--follow up visit and CPX... he had an episode of asthmatic bronchitis earlier this year and required Pred + antibiotics to finally clear it- treated by TParrett,NP & DrJohn...  November 12, 2008-Complains of nasal congestion x 3 wks--draining down into chest--coughed up brown sputum this am--body aches, fever 100.0. Symptoms have been waxing and waning, worse last week. now running low grade fevers. Using cold meds w/ no help.   September 18, 2009 --Presents for an acute office visit. Complains of -? fever more so at night, ?sinus infection/pressure, headache over eyes,"dizzy" feelings; tired and fatigued. Slight ear gurggle.Drainage and cough started at night x 2-3 weeks off and on. Then 1 week ago, sinuses got worse w/ PND, cough. Now feels chilled, low grade fever, frontal headache, sinus pressure. Took headache meds w/ no help. Has felt lightheaded, worse w/ turning head.  Using netti pot.   10/02/09-Presents for an acute office visit. He complains of nausea, diarrhea, body aches , weak for 3 days. Seen 2 weeks ago for Sinusitis, tx w/ Augemntin x 10d which he finished on 4/17. Diarrhea started on 4/19 w/ loose stools, decreased appetite and nausea. Feels achy, no known fever. Denies chest pain, dyspnea, orthopnea, hemoptysis, fever,  edema, headache,, bloody stools.   Medications Prior to Update: 1)  Aspirin Ec 81 Mg Tbec (Aspirin) .... Take 1 Tab By Mouth Once Daily.Marland KitchenMarland Kitchen 2)  Protonix 40 Mg  Tbec (Pantoprazole Sodium) .Marland Kitchen.. 1 By Mouth Once Daily As Needed 3)  Augmentin 875-125 Mg Tabs (Amoxicillin-Pot Clavulanate) .Marland Kitchen.. 1 By Mouth Two Times A Day  Current Medications (verified): 1)  Aspirin Ec 81 Mg Tbec (Aspirin) .... Take 1 Tab By Mouth Once Daily.Marland KitchenMarland Kitchen 2)  Protonix 40 Mg  Tbec (Pantoprazole  Sodium) .Marland Kitchen.. 1 By Mouth Once Daily As Needed  Allergies (verified): 1)  ! Bactrim Ds (Sulfamethoxazole-Trimethoprim) 2)  ! Levaquin  Past History:  Past Medical History: Last updated: 04/18/2009 ALLERGY (ICD-995.3) PHYSICAL EXAMINATION (ICD-V70.0) Hx of ASTHMATIC BRONCHITIS, ACUTE (ICD-466.0) HYPERLIPIDEMIA (ICD-272.4) GERD (ICD-530.81) COLONIC POLYPS (ICD-211.3) ? of PROSTATITIS NOS (ICD-601.9) DEGENERATIVE JOINT DISEASE (ICD-715.90) LOW BACK PAIN (ICD-724.2) MYOFASCIAL PAIN SYNDROME (ICD-729.1) HEADACHE (ICD-784.0) Hx of ALTERED MENTAL STATUS (ICD-780.97) ANXIETY (ICD-300.00) SYMPTOM, INSOMNIA NOS (ICD-780.52)  Past Surgical History: Last updated: 04/18/2009 S/P bilat Inguinal herniorrhaphy in the 1960's S/P Appendectomy in 1967 S/P knee surgery x2 in 2004 & 2008 S/P bilat shoulder surgery in 2006 & 2007 & 2010 S/P ganglion cyst surgery in 2009  Family History: Last updated: 03/22/2007 Family History of Colon CA 1st degree relative <60 Family History of CAD Male 1st degree relative <50 Family History High cholesterol Family History Hypertension  Social History: Last updated: 12/10/2008 Married Never Smoked 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  Is Patient Diabetic? No Comments Medications reviewed with patient Daytime contact number verified with patient. Boone Master CNA  October 02, 2009 4:05 PM    Physical Exam  Additional Exam:  WD, WN, 50 y/o WM in  NAD... GENERAL:  Alert & oriented; pleasant & cooperative... HEENT:  [place]/AT, EACs-clear, TMs-wnl, NOSE-mild redness, tender max sinus,  THROAT-clear & wnl, nonicteric.  NECK:  Supple w/ fairROM; no JVD; normal carotid impulses w/o bruits; no thyromegaly or nodules palpated; no lymphadenopathy. CHEST:  CTA w/ no wheezing HEART:  Regular Rhythm; without murmurs/ rubs/ or gallops. ABDOMEN:  Soft & nontender; normal bowel sounds; no organomegaly or masses detected, no guarding/rebound.   EXT: without deformities, mild arthritic changes; no varicose veins/ venous insuffic/ or edema.     Impression & Recommendations:  Problem # 1:  DIARRHEA (ICD-787.91)  Acute gastritis w/ Diarrhea. He has been on Augmentin , will tx as acute diarrhea  if continues will need to check for cdiff and tx w/ flagyl for now recommend the following.  REC:  Advance bland diet as tolerated.  Push fluids.  Avoid lactose for 5 days Restart Protonix 40mg  once daily  Cipro 500mg  two times a day for 3 days.  Immodium AD as needed diarrhea  Zofran as needed nausea.  Please contact office for sooner follow up if symptoms do not improve or worsen   Orders: Est. Patient Level IV (16109)  Medications Added to Medication List This Visit: 1)  Cipro 500 Mg Tabs (Ciprofloxacin hcl) .Marland Kitchen.. 1 by mouth two times a day for 3 days for diarrhea 2)  Zofran 4 Mg Tabs (Ondansetron hcl) .Marland Kitchen.. 1 by mouth every 4-6 hr as needed nausea  Complete Medication List: 1)  Aspirin Ec 81 Mg Tbec (Aspirin) .... Take 1 tab by mouth once daily.Marland KitchenMarland Kitchen 2)  Protonix 40 Mg Tbec (Pantoprazole sodium) .Marland Kitchen.. 1 by mouth once daily as needed 3)  Cipro 500 Mg Tabs (Ciprofloxacin hcl) .Marland Kitchen.. 1 by mouth two times a day for 3 days for diarrhea 4)  Zofran 4 Mg Tabs (Ondansetron hcl) .Marland Kitchen.. 1 by mouth every 4-6 hr as needed nausea  Patient Instructions: 1)  Advance bland diet as tolerated.  2)  Push fluids.  3)  Avoid lactose for 5 days 4)  Restart Protonix 40mg  once  daily  5)  Cipro 500mg  two times a day for 3 days.  6)  Immodium AD as needed diarrhea  7)  Zofran as needed nausea.  8)  Please contact office for sooner follow up if symptoms do not improve or worsen  Prescriptions: ZOFRAN 4 MG TABS (ONDANSETRON HCL) 1 by mouth every 4-6 hr as needed nausea  #20 x 0   Entered and Authorized by:   Rubye Oaks NP   Signed by:   Rubye Oaks NP on 10/02/2009   Method used:   Electronically to        [facility]* (retail)       [address]       [place], [place]  [id number]       Ph: [phone]       Fax: [phone]   RxID:   [id number] CIPRO 500 MG TABS (CIPROFLOXACIN HCL) 1 by mouth two times a day for 3 days for diarrhea  #6 x 0  Entered and Authorized by:   Rubye Oaks NP   Signed by:   Rubye Oaks NP on 10/02/2009   Method used:   Electronically to        [facility]* (retail)       [address]       [place], [place]  [id number]       Ph: [phone]       Fax: [phone]   RxID:   [id number]

## 2010-07-14 NOTE — Assessment & Plan Note (Signed)
Summary: cpx/ mbw   Chief Complaint:  19 month ROV & CPX....  History of Present Illness: 50 y/o WM here for a follow up visit and CPX... he had an episode of asthmatic bronchitis earlier this year and required Pred + antibiotics to finally clear it- treated by TParrett,NP & DrJohn...    Current Problem List:  PHYSICAL EXAMINATION (ICD-V70.0) - he takes ASA 81mg /d...  Hx of ASTHMATIC BRONCHITIS, ACUTE (ICD-466.0) - AB exas earlier this year w/ Biaxin, Prednisone, Tussionex... finally resolved and doesn't require regular inhalers so far... he also had a sinusitis & dizzy eval by [facility] w/ CT Sinuses showing clear x sm amt of fluid in sphenoid sinus- he states all symptoms resolved w/ the Pred Rx...  HYPERLIPIDEMIA (ICD-272.4) - prev on Simvastatin 20mg /d, but pt stopped this on his own because it made him "feel bad"...  ~  FLP 4/07 showed TChol 238, TG 64, HDL 56, LDL 171... rec- try Simva20... pt dc'd on his own.  ~  FLP 8/08 showed TChol 230, TG 65, HDL 52, LDL 176... rec- vigorous diet efforts, refuses med.  ~  FLP 11/08 showed TChol 173, TG 61, HDL 55, LDL 106... fantastic job w/ diet, add exercise.  ~  FLP 10/09 =   GERD (ICD-530.81) - on PROTONIX 40mg /d Prn... followed by Dorris Singh for GI... he was in a GI drug study of reflux- study has ended... last EGD was 9/08 showing esophagitis...  COLONIC POLYPS (ICD-211.3) - colonoscopy 4/09 by Dorris Singh showed divertics & 3mm polyp= hyperplastic... f/u planned ?10 yrs (there is a + FamHx of colon cancer in his mother).  ? of PROSTATITIS NOS (ICD-601.9) - [facility] by [facility] for prostatitis... pt recently had f/u w/ Urology and everything was fine...   DEGENERATIVE JOINT DISEASE (ICD-715.90) - prev evals by Drs Bary Leriche and [place] w/ surgery to both shoulders, knee x2, and wrist... he has also seen a chiropractor- DrWillen, w/ CSpine problems...  LOW BACK PAIN (ICD-724.2) MYOFASCIAL PAIN SYNDROME (ICD-729.1)  - he has seen DrZeminski in the past for ? fibromyalgia and lumbar DDD  HEADACHE (ICD-784.0) - [facility]... felt to be migraine headaches and treated w/ TOPAMAX 25mg Tabs- 3Qhs which helps prevention + BACLOFEN, RELPAX, Tylenol & Ibuprofen for Prn use... Note: also saw DrStiefel in 1983 for severe incapacitating headaches...  DIZZINESS (ICD-780.4) - resolved w/ the AB therapy and Pred Rx...  ANXIETY (ICD-300.00)  SYMPTOM, INSOMNIA NOS (ICD-780.52)      Current Allergies (reviewed today): ! BACTRIM DS (SULFAMETHOXAZOLE-TRIMETHOPRIM) ! LEVAQUIN  Past Medical History:        Hx of ASTHMATIC BRONCHITIS, ACUTE (ICD-466.0)    HYPERLIPIDEMIA (ICD-272.4)    GERD (ICD-530.81)    COLONIC POLYPS (ICD-211.3)    ? of PROSTATITIS NOS (ICD-601.9)    DEGENERATIVE JOINT DISEASE (ICD-715.90)    LOW BACK PAIN (ICD-724.2)    MYOFASCIAL PAIN SYNDROME (ICD-729.1)    HEADACHE (ICD-784.0)    DIZZINESS (ICD-780.4)    ANXIETY (ICD-300.00)    SYMPTOM, INSOMNIA NOS (ICD-780.52)      Past Surgical History:    S/P bilat Inguinal herniorrhaphy in the 1960's    S/P Appendectomy in 1967    S/P knee surgery x2 in 2004 & 2008    S/P bilat shoulder surgery in 2006 & 2007    S/P ganglion cyst surgery in 2009   Family History:    Reviewed history from 03/22/2007 and no changes required:  Family History of Colon CA 1st degree relative <60       Family History of CAD Male 1st degree relative <50       Family History High cholesterol       Family History Hypertension  Social History:    Reviewed history from 03/22/2007 and no changes required:       Married       Never Smoked       Alcohol use-no   Risk Factors:  Tobacco use:  never   Review of Systems       The patient complains of fatigue, malaise, back pain, joint pain, and stiffness.  The patient denies fever, chills, sweats, anorexia, weakness, weight loss, sleep disorder, blurring,  diplopia, eye irritation, eye discharge, vision loss, eye pain, photophobia, earache, ear discharge, tinnitus, decreased hearing, nasal congestion, nosebleeds, sore throat, hoarseness, chest pain, palpitations, syncope, dyspnea on exertion, orthopnea, PND, peripheral edema, cough, dyspnea at rest, excessive sputum, hemoptysis, wheezing, pleurisy, nausea, vomiting, diarrhea, constipation, change in bowel habits, abdominal pain, melena, hematochezia, jaundice, gas/bloating, indigestion/heartburn, dysphagia, odynophagia, dysuria, hematuria, urinary frequency, urinary hesitancy, nocturia, incontinence, joint swelling, muscle cramps, muscle weakness, arthritis, sciatica, restless legs, leg pain at night, leg pain with exertion, rash, itching, dryness, suspicious lesions, paralysis, paresthesias, seizures, tremors, vertigo, transient blindness, frequent falls, frequent headaches, difficulty walking, depression, anxiety, memory loss, confusion, cold intolerance, heat intolerance, polydipsia, polyphagia, polyuria, unusual weight change, abnormal bruising, bleeding, enlarged lymph nodes, urticaria, allergic rash, hay fever, and recurrent infections.     Vital Signs:  Patient Profile:   50 Years Old Male Height:     70 inches Weight:      150.13 pounds O2 Sat:      98 % O2 treatment:    Room Air Temp:     99.8 degrees F oral Pulse rate:   76 / minute BP sitting:   108 / 60  (left arm) Cuff size:   regular  Vitals Entered By: Abigail Miyamoto RN (April 05, 2008 9:50 AM)             Is Patient Diabetic? No     Physical Exam  WD, WN, 50 y/o WM in NAD... GENERAL:  Alert & oriented; pleasant & cooperative... HEENT:  [place]/AT, EOM-wnl, PERRLA, Fundi-benign, EACs-clear, TMs-wnl, NOSE-clear, THROAT-clear & wnl. NECK:  Supple w/ fairROM; no JVD; normal carotid impulses w/o bruits; no thyromegaly or nodules palpated; no lymphadenopathy. CHEST:  Clear to P & A; without wheezes/ rales/ or rhonchi. HEART:   Regular Rhythm; without murmurs/ rubs/ or gallops. ABDOMEN:  Soft & nontender; normal bowel sounds; no organomegaly or masses detected. RECTAL:  Neg - prostate 2+ & nontender w/o nodules; stool hematest neg. EXT: without deformities, mild arthritic changes; no varicose veins/ venous insuffic/ or edema. NEURO:  CN's intact; motor testing normal; sensory testing normal; gait normal & balance OK. DERM:  No lesions noted; no rash etc...        Impression & Recommendations:  Problem # 1:  PHYSICAL EXAMINATION (ICD-V70.0) He has hx mult somatic complaints...  doing reasonably well on a simple med regimen...  Orders: Venipuncture (04540) TLB-Lipid Panel (80061-LIPID) TLB-BMP (Basic Metabolic Panel-BMET) (80048-METABOL) TLB-CBC Platelet - w/Differential (85025-CBCD) TLB-Hepatic/Liver Function Pnl (80076-HEPATIC) TLB-TSH (Thyroid Stimulating Hormone) (84443-TSH) TLB-PSA (Prostate Specific Antigen) (84153-PSA) TLB-Udip w/ Micro (81001-URINE)   Problem # 2:  Hx of ASTHMATIC BRONCHITIS, ACUTE (ICD-466.0) Symptoms all resolved w/ prev Rx... no need for anti-inflamm inhalers etc... The following medications were removed from the  medication list:    Tessalon 200 Mg Caps (Benzonatate) .Marland Kitchen... 1 by mouth three times a day as needed cough    Augmentin 875-125 Mg Tabs (Amoxicillin-pot clavulanate) .Marland Kitchen... 1 by mouth two times a day with food   Problem # 3:  HYPERLIPIDEMIA (ICD-272.4) He was much improved w/ diet alone at the time of his last FLP...  Problem # 4:  GERD (ICD-530.81) He is off the GI drug study and uses Protonix Prn... His updated medication list for this problem includes:    Protonix 40 Mg Tbec (Pantoprazole sodium) .Marland Kitchen... 1 by mouth once daily as needed   Problem # 5:  COLONIC POLYPS (ICD-211.3) S/P colonoscopy w/ hyperplastic polyp removed... f/u planned ?77yrs per DrKaplan.  Problem # 6:  ? of PROSTATITIS NOS (ICD-601.9) He had Urology eval 2/09 by Delorise Royals and all is  well...  Problem # 7:  DEGENERATIVE JOINT DISEASE (ICD-715.90) He has seen a number of physicians and had many surgeries...  His updated medication list for this problem includes:    Aspirin Ec 81 Mg Tbec (Aspirin) .Marland Kitchen... Take 1 tab by mouth once daily...   Problem # 8:  HEADACHE (ICD-784.0) [facility]... His updated medication list for this problem includes:    Aspirin Ec 81 Mg Tbec (Aspirin) .Marland Kitchen... Take 1 tab by mouth once daily...   Problem # 9:  ANXIETY (ICD-300.00) He has alot of anxieties but does not want perscription medication for this...  Complete Medication List: 1)  Aspirin Ec 81 Mg Tbec (Aspirin) .... Take 1 tab by mouth once daily.Marland KitchenMarland Kitchen 2)  Protonix 40 Mg Tbec (Pantoprazole sodium) .Marland Kitchen.. 1 by mouth once daily as needed 3)  Topamax 25 Mg Tabs (Topiramate) .... Take 3 tabs at bedtime   Patient Instructions: 1)  Today we updated your med list- see below.... 2)  Today we did your follow up blood work... please call the "phone tree" in a few days for your lab results.Marland KitchenMarland Kitchen 3)  Call for any problems.Marland KitchenMarland Kitchen 4)  Please schedule a follow-up appointment in 1 year, sooner as needed.   ]

## 2010-07-14 NOTE — Assessment & Plan Note (Signed)
Vital Signs:  Patient Profile:   50 Years Old Male Weight:      164 pounds Temp:     98.7 degrees F Pulse rate:   79 / minute BP sitting:   123 / 86  Vitals Entered By: Corwin Levins MD (March 22, 2007 2:42 PM)                 Acute Visit History:      The patient complains of headache.  Other comments include: 4 days ongoing severe, pounding, similar to migraine he gets approx 1 time per month, assoc with nausea and decreased appetite but no blurred vision or other neuro symptoms.        The patient has a prior history of GERD.  Pertinent previous surgeries include appendectomy.         Current Allergies (reviewed today): ! ADULT ASPIRIN LOW STRENGTH (ASPIRIN) ! BACTRIM DS (SULFAMETHOXAZOLE-TRIMETHOPRIM) Updated/Current Medications (including changes made in today's visit):  EQL OMEPRAZOLE 20 MG  TBEC (OMEPRAZOLE) 1 by mouth qd ZOCOR 40 MG  TABS (SIMVASTATIN) 1 by mouth qd PROTONIX 40 MG  PACK (PANTOPRAZOLE SODIUM) 1 by mouth qd FIORICET 50-325-40 MG  TABS (BUTALBITAL-APAP-CAFFEINE) 1 by mouth once daily prn   Past Medical History:    Reviewed history and no changes required:       Anxiety       Colonic polyps, hx of       GERD       Hyperlipidemia       h/o prostatitis       insomnia       Low back pain/lumbar disc dz       cervical spine disc dz       Asthma  Past Surgical History:    Reviewed history and no changes required:       Inguinal herniorrhaphy       Appendectomy       knee surg   Family History:    Reviewed history and no changes required:       Family History of Colon CA 1st degree relative <60       Family History of CAD Male 1st degree relative <50       Family History High cholesterol       Family History Hypertension  Social History:    Reviewed history and no changes required:       Married       Never Smoked       Alcohol use-no   Risk Factors:  Tobacco use:  never Alcohol use:  no    Physical Exam  General:  Well-developed,well-nourished,in no acute distress; alert,appropriate and cooperative throughout examination Head:     Normocephalic and atraumatic without obvious abnormalities. No apparent alopecia or balding. Eyes:     No corneal or conjunctival inflammation noted. EOMI. Perrla. Funduscopic exam benign, without hemorrhages, exudates or papilledema. Vision grossly normal. Ears:     External ear exam shows no significant lesions or deformities.  Otoscopic examination reveals clear canals, tympanic membranes are intact bilaterally without bulging, retraction, inflammation or discharge. Hearing is grossly normal bilaterally. Nose:     External nasal examination shows no deformity or inflammation. Nasal mucosa are pink and moist without lesions or exudates. Mouth:     Oral mucosa and oropharynx without lesions or exudates.  Teeth in good repair. Neck:     No deformities, masses, or tenderness noted. Lungs:     Normal respiratory  effort, chest expands symmetrically. Lungs are clear to auscultation, no crackles or wheezes. Heart:     Normal rate and regular rhythm. S1 and S2 normal without gallop, murmur, click, rub or other extra sounds. Extremities:     No clubbing, cyanosis, edema, or deformity noted with normal full range of motion of all joints.   Neurologic:     No cranial nerve deficits noted. Station and gait are normal. Plantar reflexes are down-going bilaterally. DTRs are symmetrical throughout. Sensory, motor and coordinative functions appear intact.    Impression & Recommendations:  Problem # 1:  COMMON MIGRAINE (ICD-346.10) Assessment: New  His updated medication list for this problem includes:    Fioricet 50-325-40 Mg Tabs (Butalbital-apap-caffeine) .Marland Kitchen... 1 by mouth once daily prn  med given today as above, #30 no refill, should last over 6 months  Problem # 2:  HYPERLIPIDEMIA (ICD-272.4) Assessment: New  His updated medication list for this problem includes:    Zocor  40 Mg Tabs (Simvastatin) .Marland Kitchen... 1 by mouth once daily  med started yest - ok to cont, check f/u labs 4 wks   Complete Medication List: 1)  Eql Omeprazole 20 Mg Tbec (Omeprazole) .Marland Kitchen.. 1 by mouth qd 2)  Zocor 40 Mg Tabs (Simvastatin) .Marland Kitchen.. 1 by mouth qd 3)  Protonix 40 Mg Pack (Pantoprazole sodium) .Marland Kitchen.. 1 by mouth qd 4)  Fioricet 50-325-40 Mg Tabs (Butalbital-apap-caffeine) .Marland Kitchen.. 1 by mouth once daily prn   Patient Instructions: 1)  Please schedule a follow-up appointment in 1 year.    ]

## 2010-07-14 NOTE — Progress Notes (Signed)
Summary: Cx appt today   Phone Note Call from Patient   Call For: DR Arlyce Dice Summary of Call: Patient calling to cancelled appt for today. Just had surgery and is unable to come until Oct. Appt rescheduled for 03-16-10 w/ Dr Russella Dar. Initial call taken by: Leanor Kail [facility],  February 25, 2010 8:50 AM

## 2010-07-14 NOTE — Progress Notes (Signed)
Summary: cxr results  Phone Note Call from Patient Call back at Home Phone [phone]   Caller: Patient Call For: nadel Summary of Call: requests results cxr.  Initial call taken by: Tivis Ringer,  April 22, 2009 11:18 AM  Follow-up for Phone Call        FYI-Pt did review "phone tree". Pt states he will try lifestyle changes and would like to have labs rechecked the first of the year per SN recs. Pt also wanted to make SN aware that he was recovering from a rotator cuff, and was unable to exercise. Zackery Barefoot CMA  April 22, 2009 11:32 AM   Additional Follow-up for Phone Call Additional follow up Details #1::        per SN---cxr was WNL---pt will need to call the week prior to getting his labs so they can be put in the computer. Marijo File CMA  April 22, 2009 2:54 PM     Additional Follow-up for Phone Call Additional follow up Details #2::    Pt aware cxr was normal and to call 1 week prior to needing labwork. Pt had verbal understanding. Follow-up by: Michel Bickers CMA,  April 22, 2009 2:58 PM

## 2010-07-14 NOTE — Progress Notes (Signed)
  Medications Added ACYCLOVIR 200 MG CAPS (ACYCLOVIR)  ZOLPIDEM TARTRATE 10 MG TABS (ZOLPIDEM TARTRATE)        Phone Note Call from Patient Call back at [facility] Phone [phone]   Caller: Patient/ Call For: dr Jonny Ruiz Summary of Call: per pt call on simvastatin dr Jonny Ruiz prescribe  and protonix which dr Arlyce Dice presribe .c/o diziness been on protonix for 2 mos now what to know which of the two meds are causing him to have the diziness Patient's chart has been requested.   Initial call taken by: Shelbie Proctor,  May 09, 2007 10:44 AM  Follow-up for Phone Call        neither one probably; consider OV Follow-up by: Corwin Levins MD,  May 09, 2007 12:43 PM  Additional Follow-up for Phone Call Additional follow up Details #1::        Phone Call Completed, Provider Notified.pt was informed that he needed to make ov . spoke with pt May 09, 2007 1:32 PM  Additional Follow-up by: Shelbie Proctor,  May 09, 2007 1:32 PM    New/Updated Medications: ACYCLOVIR 200 MG CAPS (ACYCLOVIR)  ZOLPIDEM TARTRATE 10 MG TABS (ZOLPIDEM TARTRATE)

## 2010-07-14 NOTE — Procedures (Signed)
Summary: colonoscopy   Colonoscopy  Procedure date:  09/29/2007  Findings:      Results: Diverticulosis.       Pathology:  Hyperplastic polyp.     Location:  [facility].    Comments:      Repeat colonoscopy in 5 years.  Patient Name: Randy Moran, Randy Moran MRN: [id number] Procedure Procedures: Colonoscopy CPT: 02725.    with Hot Biopsy(s)CPT: [id number].  Personnel: Endoscopist: [name], MD.  Exam Location: Outpatient  Patient Consent: Procedure, Alternatives, Risks and Benefits discussed, consent obtained, from patient.  Indications  Average Risk Screening Routine.  History  Current Medications: Patient is not currently taking Coumadin.  Pre-Exam Physical: Performed Sep 29, 2007. Entire physical exam was normal. Cardio- pulmonary exam, HEENT exam , Abdominal exam, Mental status exam WNL.  Comments: Patient history reviewed/updated, physical performed prior to initiation of sedation?yes Exam Exam: Extent of exam reached: Cecum, extent intended: Cecum.  The cecum was identified by appendiceal orifice and IC valve. Time to Cecum: 00:01: 41. Time for Withdrawl: 00:06:01. Colon retroflexion performed. ASA Classification: I. Tolerance: good.  Monitoring: Pulse and BP monitoring, Oximetry used. Supplemental O2 given. at 2 Liters.  Colon Prep Used Miralax for colon prep. Prep results: excellent.  Sedation Meds: Patient assessed and found to be appropriate for moderate (conscious) sedation. Sedation was managed by the Endoscopist. Fentanyl 75 mcg. given IV. Versed 8 mg. given IV.  Findings - NORMAL EXAM: Cecum to Descending Colon.  - DIVERTICULOSIS: Descending Colon to Sigmoid Colon. ICD9: Diverticulosis: 562.10. Comments: Scattered diverticula.  NORMAL EXAM: Cecum.  POLYP: Sigmoid Colon, Maximum size: 3 mm. sessile polyp. Distance from Anus 18 cm. Procedure:  hot biopsy, ICD9: Colon Polyps: 211.3.  - NORMAL EXAM: Sigmoid Colon to Rectum.    Assessment Abnormal examination, see findings above.  Diagnoses: 562.10: Diverticulosis.  211.3: Colon Polyps.   Events  Unplanned Interventions: No intervention was required.  Unplanned Events: There were no complications. Plans  Post Exam Instructions: Post sedation instructions given.  Patient Education: Patient given standard instructions for: Polyps. Diverticulosis.  Disposition: After procedure patient sent to recovery. After recovery patient sent home.  Scheduling/Referral: Colonoscopy, to [name], MD, if adenoma, around Sep 28, 2012.    This report was created from the original endoscopy report, which was reviewed and signed by the above listed endoscopist.

## 2010-07-14 NOTE — Assessment & Plan Note (Signed)
Summary: SHOULDER SURGERY CLEARANCE-$50--STC   Vital Signs:  Patient profile:   50 year old male Height:      69 inches Weight:      151 pounds O2 Sat:      96 % Temp:     97.1 degrees F oral Pulse rate:   80 / minute BP sitting:   114 / 76  (right arm) Cuff size:   regular  Vitals Entered By: Windell Norfolk (December 10, 2008 2:50 PM) CC: shoulder surgery clearance   CC:  shoulder surgery clearance.  History of Present Illness: needs med clearance for right shoulder roater cuff tear surgury for Jan 20, 2009 per dr Ranell Patrick; overall doing well, anxiety relatively stable, denies CP, sob, doe, wheezing, orthopnea, pnd or LE edema; good complaince with meds, wt stable; right shoulder hurt on the job   Problems Prior to Update: 1)  Preoperative Examination  (ICD-V72.84) 2)  Sinusitis, Acute  (ICD-461.9) 3)  Upper Respiratory Infection  (ICD-465.9) 4)  Allergy  (ICD-995.3) 5)  Physical Examination  (ICD-V70.0) 6)  Hx of Asthmatic Bronchitis, Acute  (ICD-466.0) 7)  Hyperlipidemia  (ICD-272.4) 8)  Gerd  (ICD-530.81) 9)  Colonic Polyps  (ICD-211.3) 10)  ? of Prostatitis Nos  (ICD-601.9) 11)  Degenerative Joint Disease  (ICD-715.90) 12)  Low Back Pain  (ICD-724.2) 13)  Myofascial Pain Syndrome  (ICD-729.1) 14)  Headache  (ICD-784.0) 15)  Dizziness  (ICD-780.4) 16)  Anxiety  (ICD-300.00) 17)  Symptom, Insomnia Nos  (ICD-780.52)  Medications Prior to Update: 1)  Aspirin Ec 81 Mg Tbec (Aspirin) .... Take 1 Tab By Mouth Once Daily.Marland KitchenMarland Kitchen 2)  Protonix 40 Mg  Tbec (Pantoprazole Sodium) .Marland Kitchen.. 1 By Mouth Once Daily As Needed 3)  Topamax 25 Mg Tabs (Topiramate) .... Take 3 Tabs At Bedtime 4)  Augmentin 875-125 Mg Tabs (Amoxicillin-Pot Clavulanate) .Marland Kitchen.. 1 By Mouth Two Times A Day 5)  Mucinex Dm Maximum Strength 60-1200 Mg Xr12h-Tab (Dextromethorphan-Guaifenesin) .Marland Kitchen.. 1 By Mouth Two Times A Day 6)  Augmentin 875-125 Mg Tabs (Amoxicillin-Pot Clavulanate) .Marland Kitchen.. 1 By Mouth Two Times A Day  Current  Medications (verified): 1)  Aspirin Ec 81 Mg Tbec (Aspirin) .... Take 1 Tab By Mouth Once Daily.Marland KitchenMarland Kitchen 2)  Protonix 40 Mg  Tbec (Pantoprazole Sodium) .Marland Kitchen.. 1 By Mouth Once Daily As Needed  Allergies (verified): 1)  ! Bactrim Ds (Sulfamethoxazole-Trimethoprim) 2)  ! 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 of PROSTATITIS NOS (ICD-601.9) DEGENERATIVE JOINT DISEASE (ICD-715.90) LOW BACK PAIN (ICD-724.2) MYOFASCIAL PAIN SYNDROME (ICD-729.1) HEADACHE (ICD-784.0) DIZZINESS (ICD-780.4) ANXIETY (ICD-300.00) SYMPTOM, INSOMNIA NOS (ICD-780.52)  Past Surgical History: Reviewed history from 04/05/2008 and no changes required. S/P bilat Inguinal herniorrhaphy in the 1960's S/P Appendectomy in 1967 S/P knee surgery x2 in 2004 & 2008 S/P bilat shoulder surgery in 2006 & 2007 S/P ganglion cyst surgery in 2009  Social History: Reviewed history from 03/22/2007 and no changes required. 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         all otherwise negative   Physical Exam  General:  alert and well-developed.   Head:  normocephalic and atraumatic.   Eyes:  vision grossly intact, pupils equal, and pupils round.   Ears:  R ear normal and L ear normal.   Nose:  no external deformity and no nasal discharge.   Mouth:  no gingival abnormalities and pharynx pink and moist.   Neck:  supple and no masses.   Lungs:  normal respiratory effort and normal breath sounds.   Heart:  normal rate and regular rhythm.   Abdomen:  soft, non-tender, and normal bowel sounds.   Msk:  no joint tenderness and no joint swelling.  ; right shoulder not evaluated today Extremities:  no edema, no ulcers  Neurologic:  cranial nerves II-XII intact and strength normal in all extremities.     Impression & Recommendations:  Problem # 1:  HYPERLIPIDEMIA (ICD-272.4)  Labs Reviewed: SGOT: 27 (04/05/2008)   SGPT: 32 (04/05/2008)   HDL:60.3 (04/05/2008), 54.5 (05/02/2007)  LDL:106 (04/05/2008), 106 (05/02/2007)  Chol:176 (04/05/2008), 173 (05/02/2007)  Trig:50 (04/05/2008), 61 (05/02/2007) d/w pt - to follow strict low chol diet, f/u next visit  Problem # 2:  PREOPERATIVE EXAMINATION (ICD-V72.84) exam o/w benign - ok for surgury, ecg reviewed Orders: EKG w/ Interpretation (93000)  Complete Medication List: 1)  Aspirin Ec 81 Mg Tbec (Aspirin) .... Take 1 tab by mouth once daily.Marland KitchenMarland Kitchen 2)  Protonix 40 Mg Tbec (Pantoprazole sodium) .Marland Kitchen.. 1 by mouth once daily as needed  Patient Instructions: 1)  Your EKG and exam were good today 2)  A note to Dr Ranell Patrick will be sent saying OK for surgury for august 3)  Please schedule a follow-up appointment in 5 months with CPX labs

## 2010-07-14 NOTE — Progress Notes (Signed)
Summary: [facility] PHYSICAL  Phone Note Call from Patient Call back at [facility] Phone [phone]   Caller: Patient Call For: NADEL Summary of Call: PT REQ TO SPEAK TO LEIGH RE: [facility] APPT (FROM BUMPED CPX APPT 11/11).  Initial call taken by: Tivis Ringer, CNA,  April 28, 2010 3:41 PM  Follow-up for Phone Call        called and spoke with pt and rescheduled his cpx appt from 11-11 to 07-03-2010 at 10:30.  pt is aware of appt date and time and will call with any problems Randell Loop CMA  April 28, 2010 4:01 PM

## 2010-07-14 NOTE — Letter (Signed)
Summary: Out of Work  [employer]  [address]   [place], [place] [address]   Phone: [phone]  Fax: [phone]    October 02, 2009   Employee:  Randy Moran    To Whom It May Concern:   For Medical reasons, please excuse the above named employee from work for the following dates:  Start:   09/29/09  End:   10/06/09  If you need additional information, please feel free to contact our office.         Sincerely,    Rubye Oaks, NP

## 2010-07-14 NOTE — Assessment & Plan Note (Signed)
Summary: Acute NP office visit   CC:   chest congestion, wheezing and dyspnea x2weeks, improving over the last week. , and Lipid Management.  History of Present Illness: 50  year old male with known history of bronchitis, never smoker,  04/05/08--follow up visit and CPX... he had an episode of asthmatic bronchitis earlier this year and required Pred + antibiotics to finally clear it- treated by TParrett,NP & DrJohn...  November 12, 2008-Complains of nasal congestion x 3 wks--draining down into chest--coughed up brown sputum this am--body aches, fever 100.0. Symptoms have been waxing and waning, worse last week. now running low grade fevers. Using cold meds w/ no help.   September 18, 2009 --Presents for an acute office visit. Complains of -? fever more so at night, ?sinus infection/pressure, headache over eyes,"dizzy" feelings; tired and fatigued. Slight ear gurggle.Drainage and cough started at night x 2-3 weeks off and on. Then 1 week ago, sinuses got worse w/ PND, cough. Now feels chilled, low grade fever, frontal headache, sinus pressure. Took headache meds w/ no help. Has felt lightheaded, worse w/ turning head.  Using netti pot.   10/02/09-Presents for an acute office visit. He complains of nausea, diarrhea, body aches , weak for 3 days. Seen 2 weeks ago for Sinusitis, tx w/ Augemntin x 10d which he finished on 4/17. Diarrhea started on 4/19 w/ loose stools, decreased appetite and nausea. Feels achy, no known fever.   PAGE 2 >>>>>>>>>>>>>>>>>>>>>>.    February 23, 2010--Presents for an acute office visit.  Complains of chest congestion, wheezing and dyspnea x2weeks, improving over the last week. Recent rotator cuff surgery 3 weeks ago- Dr. Ranell Patrick. Started after surgery, seems to be improving. Wants to be checked for H. Pylori has been having reflux  and intermittent loose stools. Takes immodium 1-2 week. for last 6 months . Stopped protonix for a while but diarrhea did not stop.  Occasional blood in  stool.   Has had increased reflux, some diarrhea no better since last ov in April; has increased protonix to bid and immodium twice a week. Last colonoscopy 2009 w/ neg path on polyp. Denies chest pain, dyspnea, orthopnea, hemoptysis, fever, urinary symptoms. Worse after eating, so has cut down on eating to avoid symptoms   Lipid Management History:      Positive NCEP/ATP III risk factors include male age 37 years old or older.  Negative NCEP/ATP III risk factors include HDL cholesterol greater than 60 and non-tobacco-user status.     Medications Prior to Update: 1)  Aspirin Ec 81 Mg Tbec (Aspirin) .... Take 1 Tab By Mouth Once Daily.Marland KitchenMarland Kitchen 2)  Protonix 40 Mg  Tbec (Pantoprazole Sodium) .Marland Kitchen.. 1 By Mouth Once Daily As Needed 3)  Cipro 500 Mg Tabs (Ciprofloxacin Hcl) .Marland Kitchen.. 1 By Mouth Two Times A Day For 3 Days For Diarrhea 4)  Zofran 4 Mg Tabs (Ondansetron Hcl) .Marland Kitchen.. 1 By Mouth Every 4-6 Hr As Needed Nausea  Current Medications (verified): 1)  Aspirin Ec 81 Mg Tbec (Aspirin) .... Take 1 Tab By Mouth Once Daily.Marland KitchenMarland Kitchen 2)  Protonix 40 Mg  Tbec (Pantoprazole Sodium) .Marland Kitchen.. 1 By Mouth Once Daily As Needed 3)  Zofran 4 Mg Tabs (Ondansetron Hcl) .Marland Kitchen.. 1 By Mouth Every 4-6 Hr As Needed Nausea 4)  Relpax 40 Mg Tabs (Eletriptan Hydrobromide) .... As Needed Migraines 5)  Tramadol Hcl 50 Mg Tabs (Tramadol Hcl) .... As Needed Pain  Allergies (verified): 1)  ! Bactrim Ds (Sulfamethoxazole-Trimethoprim) 2)  ! Levaquin  Past  History:  Past Medical History: Last updated: 04/18/2009 ALLERGY (ICD-995.3) PHYSICAL EXAMINATION (ICD-V70.0) Hx of ASTHMATIC BRONCHITIS, ACUTE (ICD-466.0) HYPERLIPIDEMIA (ICD-272.4) GERD (ICD-530.81) COLONIC POLYPS (ICD-211.3) ? of PROSTATITIS NOS (ICD-601.9) DEGENERATIVE JOINT DISEASE (ICD-715.90) LOW BACK PAIN (ICD-724.2) MYOFASCIAL PAIN SYNDROME (ICD-729.1) HEADACHE (ICD-784.0) Hx of ALTERED MENTAL STATUS (ICD-780.97) ANXIETY (ICD-300.00) SYMPTOM, INSOMNIA NOS (ICD-780.52)  Past  Surgical History: Last updated: 04/18/2009 S/P bilat Inguinal herniorrhaphy in the 1960's S/P Appendectomy in 1967 S/P knee surgery x2 in 2004 & 2008 S/P bilat shoulder surgery in 2006 & 2007 & 2010 S/P ganglion cyst surgery in 2009  Family History: Last updated: 03/22/2007 Family History of Colon CA 1st degree relative <60 Family History of CAD Male 1st degree relative <50 Family History High cholesterol Family History Hypertension  Social History: Last updated: 12/10/2008 Married Never Smoked Alcohol use-no work - [employer]  Risk Factors: Smoking Status: never (04/05/2008)  Review of Systems      See HPI  Vital Signs:  Patient profile:   50 year old male Height:      69 inches Weight:      148.25 pounds BMI:     21.97 O2 Sat:      99 % on Room air Temp:     99.0 degrees F oral Pulse rate:   88 / minute BP sitting:   102 / 74  (left arm) Cuff size:   regular  Vitals Entered By: Boone Master CNA/MA (February 23, 2010 11:25 AM)  O2 Flow:  Room air CC:  chest congestion, wheezing and dyspnea x2weeks, improving over the last week. , Lipid Management Is Patient Diabetic? No Comments Medications reviewed with patient Daytime contact number verified with patient. Boone Master CNA/MA  February 23, 2010 11:25 AM    Physical Exam  Additional Exam:  WD, WN, 50 y/o WM in NAD... GENERAL:  Alert & oriented; pleasant & cooperative... HEENT:  [place]/AT, EACs-clear, TMs-wnl, Nose nml ,  THROAT-clear & wnl, nonicteric.  NECK:  Supple w/ fairROM; no JVD; normal carotid impulses w/o bruits; no thyromegaly or nodules palpated; no lymphadenopathy. CHEST:  CTA w/ no wheezing HEART:  Regular Rhythm; without murmurs/ rubs/ or gallops. ABDOMEN:  Soft & nontender; normal bowel sounds; no organomegaly or masses detected, no guarding/rebound. neg CVA tenderness,  rectal exam empty vault, nml sphincter tone, positive hemocult stool.   EXT: without deformities, mild arthritic  changes; no varicose veins/ venous insuffic/ or edema.     Impression & Recommendations:  Problem # 1:  DIARRHEA (ICD-787.91) w/ positive occult stool. and symptoms > 6 months ? etiology pt has hx of polyps, last colon 2009 w/ neg path,  and family hx of colon cancer will refer to GI to eval heme Positive stool.  Labs pending w/ stool cx and c. diff.  Plan:  Push fluids.  Gas X w/ all meals.  Continue on Protonix 40mg  once daily  Immodium AD as needed diarrhea  We are referring you to Dr. Arlyce Dice to evaluate your diarrhea.  Please contact office for sooner follow up if symptoms do not improve or worsen  follow up Dr. Kriste Basque as 2 months as scheduled.  I will call with labs.  Orders: TLB-BMP (Basic Metabolic Panel-BMET) (80048-METABOL) TLB-CBC Platelet - w/Differential (85025-CBCD) TLB-Hepatic/Liver Function Pnl (80076-HEPATIC) TLB-TSH (Thyroid Stimulating Hormone) (84443-TSH) TLB-Amylase (82150-AMYL) TLB-Udip w/ Micro (81001-URINE) T-Urine Culture (Spectrum Order) ([id number]) TLB-Sedimentation Rate (ESR) (85652-ESR) TLB-Lipase (83690-LIPASE) TLB-H. Pylori Abs(Helicobacter Pylori) (86677-HELICO) T-Stool for O&P ([id number]) T-Culture, Stool ([id number]) T-Culture, C-Diff Toxin A/B ([id number])  Gastroenterology Referral (GI) Est. Patient Level IV (47829)  Medications Added to Medication List This Visit: 1)  Relpax 40 Mg Tabs (Eletriptan hydrobromide) .... As needed migraines 2)  Tramadol Hcl 50 Mg Tabs (Tramadol hcl) .... As needed pain  Complete Medication List: 1)  Aspirin Ec 81 Mg Tbec (Aspirin) .... Take 1 tab by mouth once daily.Marland KitchenMarland Kitchen 2)  Protonix 40 Mg Tbec (Pantoprazole sodium) .Marland Kitchen.. 1 by mouth once daily as needed 3)  Zofran 4 Mg Tabs (Ondansetron hcl) .Marland Kitchen.. 1 by mouth every 4-6 hr as needed nausea 4)  Relpax 40 Mg Tabs (Eletriptan hydrobromide) .... As needed migraines 5)  Tramadol Hcl 50 Mg Tabs (Tramadol hcl) .... As needed pain  Lipid Assessment/Plan:       Based on NCEP/ATP III, the patient's risk factor category is "0-1 risk factors".  The patient's lipid goals are as follows: Total cholesterol goal is 200; LDL cholesterol goal is 160; HDL cholesterol goal is 40; Triglyceride goal is 150.    Patient Instructions: 1)  Push fluids.  2)  Gas X w/ all meals.  3)  Continue on Protonix 40mg  once daily  4)  Immodium AD as needed diarrhea  5)  We are referring you to Dr. Arlyce Dice to evaluate your diarrhea.  6)  Please contact office for sooner follow up if symptoms do not improve or worsen  7)  follow up Dr. Kriste Basque as 2 months as scheduled.  8)  I will call with labs.

## 2010-07-14 NOTE — Progress Notes (Signed)
Summary: Diarrhea -Appt sooner   Phone Note From Other Clinic   Caller: [facility] @ Dr Kriste Basque [phone] Call For: Dr Arlyce Dice Reason for Call: Schedule Patient Appt Summary of Call: Diarrhea- Bjorn Loser did not want to schedule next available appt on 10-18-11wants c-b from triage for a sooner appt Initial call taken by: Leanor Kail [facility],  February 23, 2010 12:25 PM  Follow-up for Phone Call        Pt. will see Willette Cluster NP on 02-25-10 at 9am. Bjorn Loser will advise pt. of appt./med.list/co-pay/cx.policy.  Follow-up by: Laureen Ochs LPN,  February 23, 2010 4:13 PM

## 2010-07-14 NOTE — Progress Notes (Signed)
  Phone Note Refill Request Message from:  Fax from Pharmacy on June 27, 2008 4:37 PM  Refills Requested: Medication #1:  zolpidem tartrate 10mg   Method Requested: Electronic Initial call taken by: Payton Spark CMA,  June 27, 2008 4:37 PM  Follow-up for Phone Call        done hardcopy to LIM side B - debra  Follow-up by: Corwin Levins MD,  June 27, 2008 5:18 PM  Additional Follow-up for Phone Call Additional follow up Details #1::        Rx faxed to [facility]* (retail) [address] [place], [place]  [id number] Ph: [phone] Fax: [phone] Additional Follow-up by: Shelbie Proctor,  June 28, 2008 8:10 AM    New/Updated Medications: ZOLPIDEM TARTRATE 10 MG TABS (ZOLPIDEM TARTRATE) 1 by mouth at bedtime as needed   Prescriptions: ZOLPIDEM TARTRATE 10 MG TABS (ZOLPIDEM TARTRATE) 1 by mouth at bedtime as needed  #30 x 5   Entered and Authorized by:   Corwin Levins MD   Signed by:   Corwin Levins MD on 06/27/2008   Method used:   Print then Give to Patient   RxID:   [id number]

## 2010-07-14 NOTE — Progress Notes (Signed)
  Phone Note Call from Patient Message from:  Patient  Caller: Patient/[phone] Call For: dr Jonny Ruiz Summary of Call: per pt call requesting an rx for [facility] [phone] Initial call taken by: Shelbie Proctor,  August 23, 2007 8:35 AM

## 2010-07-14 NOTE — Assessment & Plan Note (Signed)
Summary: chills, achy, fever/jd   CC:  Accute visit-? fever more so at night, ?sinus infection/pressure, headache over eyes, and "dizzy" feelings; tired and fatigued. Slight ear gurggle.Drainage and cough at night x 2-3 weeks off and on..  History of Present Illness: 50  year old male with known history of bronchitis, never smoker,  04/05/08--follow up visit and CPX... he had an episode of asthmatic bronchitis earlier this year and required Pred + antibiotics to finally clear it- treated by TParrett,NP & DrJohn...  November 12, 2008-Complains of nasal congestion x 3 wks--draining down into chest--coughed up brown sputum this am--body aches, fever 100.0. Symptoms have been waxing and waning, worse last week. now running low grade fevers. Using cold meds w/ no help.   September 18, 2009 --Presents for an acute office visit. Complains of -? fever more so at night, ?sinus infection/pressure, headache over eyes,"dizzy" feelings; tired and fatigued. Slight ear gurggle.Drainage and cough started at night x 2-3 weeks off and on. Then 1 week ago, sinuses got worse w/ PND, cough. Now feels chilled, low grade fever, frontal headache, sinus pressure. Took headache meds w/ no help. Has felt lightheaded, worse w/ turning head. Denies chest pain, dyspnea, orthopnea, hemoptysis, fever, n/v/d, edema, headache,recent travel or antibitoics. Using netti pot.   Current Medications (verified): 1)  Aspirin Ec 81 Mg Tbec (Aspirin) .... Take 1 Tab By Mouth Once Daily.Marland KitchenMarland Kitchen 2)  Protonix 40 Mg  Tbec (Pantoprazole Sodium) .Marland Kitchen.. 1 By Mouth Once Daily As Needed  Allergies (verified): 1)  ! Bactrim Ds (Sulfamethoxazole-Trimethoprim) 2)  ! Levaquin  Past History:  Past Medical History: Last updated: 04/18/2009 ALLERGY (ICD-995.3) PHYSICAL EXAMINATION (ICD-V70.0) Hx of ASTHMATIC BRONCHITIS, ACUTE (ICD-466.0) HYPERLIPIDEMIA (ICD-272.4) GERD (ICD-530.81) COLONIC POLYPS (ICD-211.3) ? of PROSTATITIS NOS (ICD-601.9) DEGENERATIVE  JOINT DISEASE (ICD-715.90) LOW BACK PAIN (ICD-724.2) MYOFASCIAL PAIN SYNDROME (ICD-729.1) HEADACHE (ICD-784.0) Hx of ALTERED MENTAL STATUS (ICD-780.97) ANXIETY (ICD-300.00) SYMPTOM, INSOMNIA NOS (ICD-780.52)  Past Surgical History: Last updated: 04/18/2009 S/P bilat Inguinal herniorrhaphy in the 1960's S/P Appendectomy in 1967 S/P knee surgery x2 in 2004 & 2008 S/P bilat shoulder surgery in 2006 & 2007 & 2010 S/P ganglion cyst surgery in 2009  Family History: Last updated: 03/22/2007 Family History of Colon CA 1st degree relative <60 Family History of CAD Male 1st degree relative <50 Family History High cholesterol Family History Hypertension  Social History: Last updated: 12/10/2008 Married Never Smoked 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 GENERAL:  Alert & oriented; pleasant & cooperative... HEENT:  [place]/AT, EACs-clear, TMs-wnl, NOSE-mild redness, tender max sinus,  THROAT-clear & wnl. NECK:  Supple w/ fairROM; no JVD; normal carotid impulses w/o bruits; no thyromegaly or nodules palpated; no lymphadenopathy. CHEST:  CTA w/ no wheezing HEART:  Regular Rhythm; without  murmurs/ rubs/ or gallops. ABDOMEN:  Soft & nontender; normal bowel sounds; no organomegaly or masses detected.  EXT: without deformities, mild arthritic changes; no varicose veins/ venous insuffic/ or edema.     Impression & Recommendations:  Problem # 1:  SINUSITIS, ACUTE (ICD-461.9)  Augmentin 875mg  two  times a day for 10 days w/ food.  Mucinex two times a day as needed congestion Nasal rinses as needed  Increase fluids.  Afrin 2 puffs two times a day for 5 days.  Please contact office for sooner follow up if symptoms do not improve or worsen  His updated medication list for this problem includes:    Augmentin 875-125 Mg Tabs (Amoxicillin-pot clavulanate) .Marland Kitchen... 1 by mouth two times a day  Orders: Est. Patient Level IV ([address])  Medications Added to Medication List This Visit: 1)  Augmentin 875-125 Mg Tabs (Amoxicillin-pot clavulanate) .Marland Kitchen.. 1 by mouth two times a day  Complete Medication List: 1)  Aspirin Ec 81 Mg Tbec (Aspirin) .... Take 1 tab by mouth once daily.Marland KitchenMarland Kitchen 2)  Protonix 40 Mg Tbec (Pantoprazole sodium) .Marland Kitchen.. 1 by mouth once daily as needed 3)  Augmentin 875-125 Mg Tabs (Amoxicillin-pot clavulanate) .Marland Kitchen.. 1 by mouth two times a day  Patient Instructions: 1)  Augmentin 875mg  two times a day for 10 days w/ food.  2)  Mucinex two times a day as needed congestion 3)  Nasal rinses as needed  4)  Increase fluids.  5)  Afrin 2 puffs two times a day for 5 days.  6)  Please contact office for sooner follow up if symptoms do not improve or worsen  Prescriptions: AUGMENTIN 875-125 MG TABS (AMOXICILLIN-POT CLAVULANATE) 1 by mouth two times a day  #20 x 0   Entered and Authorized by:   Rubye Oaks NP   Signed by:   Rubye Oaks NP on 09/18/2009   Method used:   Electronically to        [facility]* (retail)       [address]       [place], [place]  [address]       Ph: [phone]       Fax: [phone]   RxID:   [id number]    Immunization History:  Influenza Immunization History:    Influenza:  historical (03/06/2009)

## 2010-07-14 NOTE — Progress Notes (Signed)
Summary: cough/ rx req  Phone Note Call from Patient Call back at Home Phone [phone]   Caller: Patient Call For: nadel Summary of Call: pt c/o congestion w/ cough- yellow/brown phlegm x 2 wks. low-grade fever 99.5 for a few days as well.  requests rx called [place] at [facility].  Initial call taken by: Tivis Ringer,  November 12, 2008 9:44 AM  Follow-up for Phone Call        Pt last seen 10/09. Called spoke with pt.  Pt states he has a sinus infection and thinks it is moving to chest.  Made OV with TP for today at 4:15p. Follow-up by: Cloyde Reams RN,  November 12, 2008 11:52 AM

## 2010-07-14 NOTE — Procedures (Signed)
Summary: EGD   EGD  Procedure date:  03/07/2007  Findings:      Findings: Esophagitis  [facility]   Patient Name: Randy Moran, Randy Moran MRN: [id number] Procedure Procedures: Panendoscopy (EGD) CPT: 43235.  Personnel: Endoscopist: [name], MD.  Indications Symptoms: Reflux symptoms  Comments: Per Protocol History  Current Medications: Patient is not currently taking Coumadin.  Pre-Exam Physical: Performed Feb 23, 2007  Entire physical exam was normal. Cardio- pulmonary exam, Abdominal exam WNL.  Exam Exam Info: Maximum depth of insertion Duodenum, intended Duodenum. Patient position: on left side. ASA Classification: I. Tolerance: excellent.  Sedation Meds: Patient assessed and found to be appropriate for moderate (conscious) sedation. Fentanyl 50 mcg. given IV. Versed 6 mg. given IV. Robinul 0.2 given IV. Cetacaine Spray 2 sprays given aerosolized.  Monitoring: BP and pulse monitoring done. Oximetry used. Supplemental O2 given at 2 Liters.  Findings - Normal: Proximal Esophagus to Mid Esophagus.  - ESOPHAGEAL INFLAMMATION: Proximal margin 39 cm from mouth,  distal margin 40 cm. Length of inflammation: 1 cm. Edema present. [place] Classification: Grade A. ICD9: Esophagitis: 530.10.  - Normal: Cardia to Duodenal 2nd Portion.   Assessment Abnormal examination, see findings above.  Diagnoses: 530.10: Esophagitis.   Events  Unplanned Intervention: No unplanned interventions were required.  Unplanned Events: There were no complications. Plans Medication(s): PPI: Pantoprazole/Protonix 40 mg QD, starting Feb 23, 2007   Scheduling: Office Visit, to [employer]. Arlyce Dice, MD, around Apr 06, 2007.    This report was created from the original endoscopy report, which was reviewed and signed by the above listed endoscopist.

## 2010-07-14 NOTE — Progress Notes (Signed)
Summary: antibotic  Phone Note Call from Patient Call back at Home Phone [phone]   Caller: Patient/[phone] wife  Call For: dr Jonny Ruiz Summary of Call: pt requesting an antiobitic stated dr Jonny Ruiz told  pt to call the office if he did not feel well saw him on yesterday for an headache . requesting  antibotic med to be sent [facility] [phone] Initial call taken by: Shelbie Proctor,  July 21, 2007 4:54 PM  Follow-up for Phone Call        ok - done escript Follow-up by: Corwin Levins MD,  July 21, 2007 4:56 PM  Additional Follow-up for Phone Call Additional follow up Details #1::        called pt to inform  spoke with pt wife about dr Jonny Ruiz escript rx  Additional Follow-up by: Shelbie Proctor,  July 21, 2007 5:01 PM    New/Updated Medications: AZITHROMYCIN 250 MG  TABS (AZITHROMYCIN) 2po qd for 1 day, then 1po qd for 4days, then stop   Prescriptions: AZITHROMYCIN 250 MG  TABS (AZITHROMYCIN) 2po qd for 1 day, then 1po qd for 4days, then stop  #6 x 1   Entered and Authorized by:   Corwin Levins MD   Signed by:   Corwin Levins MD on 07/21/2007   Method used:   Electronically sent to ...       [facility]*       [address]       [place], [place]  [address]       Ph: [phone] or [phone]       Fax: [phone]   RxID:   [id number]

## 2010-07-14 NOTE — Procedures (Signed)
Summary: colonoscopy   Colonoscopy  Procedure date:  10/26/2002  Findings:      Results: Normal. Location:  [facility].    Comments:      Repeat colonoscopy in 10 years.    Colonoscopy  Procedure date:  10/26/2002  Findings:      Results: Normal. Location:  [facility].    Comments:      Repeat colonoscopy in 10 years.   Patient Name: Randy Moran, Randy Moran MRN: [id number] Procedure Procedures: Colorectal cancer screening, average risk CPT: G0121.  Personnel: Endoscopist: [name], MD.  Referred By: Lonzo Cloud. Kriste Basque, MD.  Indications Symptoms: Hematochezia.  History  Pre-Exam Physical: Performed Oct 26, 2002. Entire physical exam was normal.  Exam Exam: Extent of exam reached: Cecum, extent intended: Cecum.  The cecum was identified by IC valve. Colon retroflexion performed. ASA Classification: I. Tolerance: excellent.  Monitoring: Pulse and BP monitoring, Oximetry used. Supplemental O2 given. at 2 Liters.  Colon Prep Used Golytely for colon prep. Prep results: good.  Sedation Meds: Fentanyl 75 mcg. given IV. Versed 7 mg. given IV.  Findings - NORMAL EXAM: Cecum to Rectum.   Assessment Normal examination.  Events  Unplanned Interventions: No intervention was required.  Unplanned Events: There were no complications. Plans Patient Education: Patient given standard instructions for: a normal exam.  Scheduling/Referral: Colonoscopy, to Molly Maduro D. Arlyce Dice, MD, around Oct 25, 2012.    This report was created from the original endoscopy report, which was reviewed and signed by the above listed endoscopist.

## 2010-07-14 NOTE — Procedures (Signed)
Summary: EGD   EGD  Procedure date:  01/26/2007  Findings:      Findings: Esophagitis  Location: [facility]    EGD  Procedure date:  01/26/2007  Findings:      Findings: Esophagitis  Location: [facility]   Patient Name: Randy Moran, Randy Moran MRN: [id number] Procedure Procedures: Panendoscopy (EGD) CPT: 43235.  Personnel: Endoscopist: [name], MD.  Indications Symptoms: Reflux symptoms  Comments: Per [facility] Study Protocol History  Current Medications: Patient is not currently taking Coumadin.  Pre-Exam Physical: Performed Jan 26, 2007  Entire physical exam was normal. Cardio- pulmonary exam, Abdominal exam WNL.  Exam Exam Info: Maximum depth of insertion Duodenum, intended Duodenum. Patient position: on left side. ASA Classification: I. Tolerance: excellent.  Sedation Meds: Patient assessed and found to be appropriate for moderate (conscious) sedation. Fentanyl 50 mcg. given IV. Versed 4 mg. given IV. Robinul 0.2 given IV. Cetacaine Spray 2 sprays given aerosolized.  Monitoring: BP and pulse monitoring done. Oximetry used. Supplemental O2 given at 2 Liters.  Findings - Normal: Proximal Esophagus to Mid Esophagus.  - ESOPHAGEAL INFLAMMATION: Proximal margin 39 cm from mouth,  distal margin 40 cm. Length of inflammation: 1 cm. Edema present. [place] Classification: Grade A. ICD9: Esophagitis: 530.10.  - Normal: Fundus to Duodenal 2nd Portion.   Assessment Abnormal examination, see findings above.  Diagnoses: 530.10: Esophagitis.   Events  Unplanned Intervention: No unplanned interventions were required.  Unplanned Events: There were no complications. Plans Comments: Per protocol Comments: f/u per protocol This report was created from the original endoscopy report, which was reviewed and signed by the above listed endoscopist.

## 2010-07-14 NOTE — Letter (Signed)
Summary: [facility] Consult Scheduled Letter  [facility]  [address] [place], [place] [address]   Phone: [phone]  Fax: [phone]    06/30/2007 MRN: [id number]    Dear Mr. Escajeda,      We have scheduled an appointment for you.  At the recommendation of Dr.James Jonny Ruiz, we have scheduled you a consult with Dr.Chris Ezzard Standing on 07/18/07 at 1:00pm.  Their phone number is [phone].  If this appointment day and time is not convenient for you, please feel free to call the office of the doctor you are being referred to at the number listed above and reschedule the appointment.   [facility] [address] [place] [address]   Thank you,  Patient Care Coordinator  [facility]

## 2010-07-14 NOTE — Miscellaneous (Signed)
  Clinical Lists Changes  Medications: Rx of FIORICET 50-325-40 MG  TABS (BUTALBITAL-APAP-CAFFEINE) 1 by mouth once daily prn;  #30 x 2;  Signed;  Entered by: Maris Berger;  Authorized by: Corwin Levins MD;  Method used: Printed then faxed to [facility]*, [address], [place], [place], [place]  [address], Ph: [phone] or [phone], Fax: [phone]    Prescriptions: FIORICET 50-325-40 MG  TABS [facility]) 1 by mouth once daily prn  #30 x 2   Entered by:   Maris Berger   Authorized by:   Corwin Levins MD   Signed by:   Maris Berger on 06/26/2007   Method used:   Printed then faxed to ...       [facility]*       [address]       [place], [place]  [address]       Ph: [phone] or [phone]       Fax: [phone]   RxID:   [id number]

## 2010-07-14 NOTE — Progress Notes (Signed)
  Phone Note Call from Patient Call back at Home Phone [phone]   Caller: Patient/[phone] Call For: dr Jonny Ruiz Summary of Call:   Patient/[phone] Call For: dr Jonny Ruiz Summary of Call: per pt call requesting an rx for ambein [facility] [phone] pt stated dr Jonny Ruiz asked if he needed the medication . the last time he was seen by him Patient's chart has been requested.       Initial call taken by: Shelbie Proctor,  August 23, 2007 8:36 AM  Follow-up for Phone Call        done hardcopy for pt pickup b/c this drug cannot be sent on computer to the pharmacy Follow-up by: Corwin Levins MD,  August 23, 2007 8:40 AM  Additional Follow-up for Phone Call Additional follow up Details #1::        Phone Call Completed, Rx faxed  In,to [facility] pt made aware spoke with pt August 23, 2007 9:46 AM   Provider Notified Additional Follow-up by: Shelbie Proctor,  August 23, 2007 9:46 AM    New/Updated Medications: ZOLPIDEM TARTRATE 10 MG  TABS (ZOLPIDEM TARTRATE) 1 by mouth at bedtime prn   Prescriptions: ZOLPIDEM TARTRATE 10 MG  TABS (ZOLPIDEM TARTRATE) 1 by mouth at bedtime prn  #30 x 5   Entered and Authorized by:   Corwin Levins MD   Signed by:   Corwin Levins MD on 08/23/2007   Method used:   Print then Give to Patient   RxID:   [id number]

## 2010-07-16 NOTE — Assessment & Plan Note (Signed)
Summary: Acute NP office visit - sinus inf   CC:  sinus pressure, HA, fatigue, dizziness, swollen gland behind left ear, left ear discomfort, light yellow nasal drainage in the mornings, PND with prod cough and sore throat x83month, and has been using netti pot qhs.  History of Present Illness: 50  year old male with known history of bronchitis, never smoker,  04/05/08--follow up visit and CPX... he had an episode of asthmatic bronchitis earlier this year and required Pred + antibiotics to finally clear it- treated by TParrett,NP & DrJohn...  November 12, 2008-Complains of nasal congestion x 3 wks--draining down into chest--coughed up brown sputum this am--body aches, fever 100.0. Symptoms have been waxing and waning, worse last week. now running low grade fevers. Using cold meds w/ no help.   September 18, 2009 --Presents for an acute office visit. Complains of -? fever more so at night, ?sinus infection/pressure, headache over eyes,"dizzy" feelings; tired and fatigued. Slight ear gurggle.Drainage and cough started at night x 2-3 weeks off and on. Then 1 week ago, sinuses got worse w/ PND, cough. Now feels chilled, low grade fever, frontal headache, sinus pressure. Took headache meds w/ no help. Has felt lightheaded, worse w/ turning head.  Using netti pot.   10/02/09-Presents for an acute office visit. He complains of nausea, diarrhea, body aches , weak for 3 days. Seen 2 weeks ago for Sinusitis, tx w/ Augemntin x 10d which he finished on 4/17. Diarrhea started on 4/19 w/ loose stools, decreased appetite and nausea. Feels achy, no known fever.   PAGE 2 >>>>>>>>>>>>>>>>>>>>>>.    February 23, 2010--Presents for an acute office visit.  Complains of chest congestion, wheezing and dyspnea x2weeks, improving over the last week. Recent rotator cuff surgery 3 weeks ago- Dr. Ranell Patrick. Started after surgery, seems to be improving. Wants to be checked for H. Pylori has been having reflux  and intermittent loose  stools. Takes immodium 1-2 week. for last 6 months . Stopped protonix for a while but diarrhea did not stop.  Occasional blood in stool.   Has had increased reflux, some diarrhea no better since last ov in April; has increased protonix to bid and immodium twice a week. Last colonoscopy 2009 w/ neg path on polyp. Denies chest pain, dyspnea, orthopnea, hemoptysis, fever, urinary symptoms. Worse after eating, so has cut down on eating to avoid symptoms   06/26/10--Presents for an acute office visit. Complains of sinus pressure, HA, fatigue, dizziness, swollen gland behind left ear, left ear discomfort, light yellow nasal drainage in the mornings, PND with prod cough and sore throat x61month, has been using netti pot at bedtime. Symptoms have been on and off, worse for last 1 week. OTC not helping. Denies chest pain,  orthopnea, hemoptysis, fever, n/v/d, edema, headache.   Medications Prior to Update: 1)  Aspirin Ec 81 Mg Tbec (Aspirin) .... Take 1 Tab By Mouth Once Daily.Marland KitchenMarland Kitchen 2)  Protonix 40 Mg  Tbec (Pantoprazole Sodium) .Marland Kitchen.. 1 By Mouth Once Daily As Needed 3)  Zofran 4 Mg Tabs (Ondansetron Hcl) .Marland Kitchen.. 1 By Mouth Every 4-6 Hr As Needed Nausea 4)  Relpax 40 Mg Tabs (Eletriptan Hydrobromide) .... As Needed Migraines 5)  Tramadol Hcl 50 Mg Tabs (Tramadol Hcl) .... As Needed Pain  Current Medications (verified): 1)  Aspirin Ec 81 Mg Tbec (Aspirin) .... Take 1 Tab By Mouth Once Daily.Marland KitchenMarland Kitchen 2)  Protonix 40 Mg  Tbec (Pantoprazole Sodium) .Marland Kitchen.. 1 By Mouth Once Daily As Needed 3)  Relpax  40 Mg Tabs (Eletriptan Hydrobromide) .... As Needed Migraines  Allergies (verified): 1)  ! Bactrim Ds (Sulfamethoxazole-Trimethoprim) 2)  ! Levaquin  Past History:  Past Medical History: Last updated: 02/24/2010 ALLERGY (ICD-995.3) PHYSICAL EXAMINATION (ICD-V70.0) Hx of ASTHMATIC BRONCHITIS, ACUTE (ICD-466.0) HYPERLIPIDEMIA (ICD-272.4) GERD (ICD-530.81) COLONIC POLYPS (ICD-211.3) ? of PROSTATITIS NOS  (ICD-601.9) DEGENERATIVE JOINT DISEASE (ICD-715.90) LOW BACK PAIN (ICD-724.2) MYOFASCIAL PAIN SYNDROME (ICD-729.1) HEADACHE (ICD-784.0) Hx of ALTERED MENTAL STATUS (ICD-780.97) ANXIETY (ICD-300.00) SYMPTOM, INSOMNIA NOS (ICD-780.52) Hyperplastic polyps 2009  Past Surgical History: Last updated: 04/18/2009 S/P bilat Inguinal herniorrhaphy in the 1960's S/P Appendectomy in 1967 S/P knee surgery x2 in 2004 & 2008 S/P bilat shoulder surgery in 2006 & 2007 & 2010 S/P ganglion cyst surgery in 2009  Family History: Last updated: 03/22/2007 Family History of Colon CA 1st degree relative <60 Family History of CAD Male 1st degree relative <50 Family History High cholesterol Family History Hypertension  Social History: Last updated: 12/10/2008 Married Never Smoked Alcohol use-no work - [employer]  Risk Factors: Smoking Status: never (04/05/2008)  Review of Systems      See HPI  Vital Signs:  Patient profile:   50 year old male Height:      69 inches Weight:      151.13 pounds BMI:     22.40 O2 Sat:      97 % on Room air Temp:     100.5 degrees F oral Pulse rate:   103 / minute BP sitting:   100 / 68  (left arm) Cuff size:   regular  Vitals Entered By: Boone Master CNA/MA (June 26, 2010 11:29 AM)  O2 Flow:  Room air CC: sinus pressure, HA, fatigue, dizziness, swollen gland behind left ear, left ear discomfort, light yellow nasal drainage in the mornings, PND with prod cough and sore throat x39month, has been using netti pot qhs Is Patient Diabetic? No Comments Medications reviewed with patient Daytime contact number verified with patient. Boone Master CNA/MA  June 26, 2010 11:29 AM    Physical Exam  Additional Exam:  WD, WN, 50 y/o WM in NAD... GENERAL:  Alert & oriented; pleasant & cooperative... HEENT:  [place]/AT, EACs-clear, TMs-wnl, Nose clear drainage , max tenderness   THROAT-clear & wnl, nonicteric.  NECK:  Supple w/ fairROM; no JVD; normal  carotid impulses w/o bruits; no thyromegaly or nodules palpated; no lymphadenopathy. CHEST:  CTA w/ no wheezing HEART:  Regular Rhythm; without murmurs/ rubs/ or gallops. ABDOMEN:  Soft & nontender; normal bowel sounds; no organomegaly or masses detected, no guarding/rebound. neg CVA tenderness,   EXT: without deformities, mild arthritic changes; no varicose veins/ venous insuffic/ or edema.     Impression & Recommendations:  Problem # 1:  SINUSITIS, ACUTE (ICD-461.9)  Augmentin 875mg  two times a day for 10 days -take with food  Eat yogurt two times a day while on antibiotic  Mucinex  two times a day for cough/congestion  Saline nasal rinses as needed  Increase fluids and rest  Afrin 2 puffs two times a day  Sudafed as needed sinus pain and pressure  Please contact office for sooner follow up if symptoms do not improve or worsen  follow up Dr. Kriste Basque as planned and as needed  His updated medication list for this problem includes:    Augmentin 875-125 Mg Tabs (Amoxicillin-pot clavulanate) .Marland Kitchen... 1 by mouth two times a day  Orders: Est. Patient Level III ([address])  Medications Added to Medication List This Visit: 1)  Augmentin 875-125  Mg Tabs (Amoxicillin-pot clavulanate) .Marland Kitchen.. 1 by mouth two times a day  Patient Instructions: 1)  Augmentin 875mg  two times a day for 10 days -take with food  2)  Eat yogurt two times a day while on antibiotic  3)  Mucinex  two times a day for cough/congestion  4)  Saline nasal rinses as needed  5)  Increase fluids and rest  6)  Afrin 2 puffs two times a day  7)  Sudafed as needed sinus pain and pressure  8)  Please contact office for sooner follow up if symptoms do not improve or worsen  9)  follow up Dr. Kriste Basque as planned and as needed  Prescriptions: AUGMENTIN 875-125 MG TABS (AMOXICILLIN-POT CLAVULANATE) 1 by mouth two times a day  #20 x 0   Entered and Authorized by:   Rubye Oaks NP   Signed by:   Rubye Oaks NP on 06/26/2010   Method used:    Electronically to        [facility]* (retail)       [address]       [place], [place]  [id number]       Ph: [phone]       Fax: [phone]   RxID:   [id number]    Immunization History:  Influenza Immunization History:    Influenza:  historical (02/12/2010)

## 2010-07-17 ENCOUNTER — Other Ambulatory Visit: Payer: Self-pay | Admitting: Pulmonary Disease

## 2010-07-17 ENCOUNTER — Encounter: Payer: Self-pay | Admitting: Pulmonary Disease

## 2010-07-17 ENCOUNTER — Ambulatory Visit (INDEPENDENT_AMBULATORY_CARE_PROVIDER_SITE_OTHER): Payer: BC Managed Care – PPO | Admitting: Pulmonary Disease

## 2010-07-17 ENCOUNTER — Other Ambulatory Visit: Payer: BC Managed Care – PPO

## 2010-07-17 ENCOUNTER — Ambulatory Visit: Admit: 2010-07-17 | Payer: Self-pay | Admitting: Pulmonary Disease

## 2010-07-17 ENCOUNTER — Ambulatory Visit (INDEPENDENT_AMBULATORY_CARE_PROVIDER_SITE_OTHER)
Admission: RE | Admit: 2010-07-17 | Discharge: 2010-07-17 | Disposition: A | Discharge status: Home / Self Care | Payer: BC Managed Care – PPO | Source: Ambulatory Visit | Attending: Pulmonary Disease | Admitting: Pulmonary Disease

## 2010-07-17 DIAGNOSIS — Z0000 Encounter for general adult medical examination without abnormal findings: Secondary | ICD-10-CM

## 2010-07-17 DIAGNOSIS — J069 Acute upper respiratory infection, unspecified: Secondary | ICD-10-CM

## 2010-07-17 DIAGNOSIS — E785 Hyperlipidemia, unspecified: Secondary | ICD-10-CM

## 2010-07-17 LAB — CBC WITH DIFFERENTIAL/PLATELET
Basophils Absolute: 0 10*3/uL (ref 0.0–0.1)
Basophils Relative: 0.4 % (ref 0.0–3.0)
Eosinophils Absolute: 0.2 10*3/uL (ref 0.0–0.7)
Eosinophils Relative: 3.9 % (ref 0.0–5.0)
HCT: 44 % (ref 39.0–52.0)
Hemoglobin: 15.3 g/dL (ref 13.0–17.0)
Lymphocytes Relative: 38.7 % (ref 12.0–46.0)
Lymphs Abs: 1.7 10*3/uL (ref 0.7–4.0)
MCHC: 34.8 g/dL (ref 30.0–36.0)
MCV: 94.2 fl (ref 78.0–100.0)
Monocytes Absolute: 0.3 10*3/uL (ref 0.1–1.0)
Monocytes Relative: 7 % (ref 3.0–12.0)
Neutro Abs: 2.2 10*3/uL (ref 1.4–7.7)
Neutrophils Relative %: 50 % (ref 43.0–77.0)
Platelets: 196 10*3/uL (ref 150.0–400.0)
RBC: 4.67 Mil/uL (ref 4.22–5.81)
RDW: 13.1 % (ref 11.5–14.6)
WBC: 4.4 10*3/uL — ABNORMAL LOW (ref 4.5–10.5)

## 2010-07-17 LAB — BASIC METABOLIC PANEL
BUN: 14 mg/dL (ref 6–23)
CO2: 27 mEq/L (ref 19–32)
Calcium: 9 mg/dL (ref 8.4–10.5)
Chloride: 105 mEq/L (ref 96–112)
Creatinine, Ser: 0.9 mg/dL (ref 0.4–1.5)
GFR: 97.71 mL/min (ref >60.00)
Glucose, Bld: 91 mg/dL (ref 70–99)
Potassium: 4.2 mEq/L (ref 3.5–5.1)
Sodium: 138 mEq/L (ref 135–145)

## 2010-07-17 LAB — HEPATIC FUNCTION PANEL
ALT: 19 U/L (ref 0–53)
AST: 19 U/L (ref 0–37)
Albumin: 3.9 g/dL (ref 3.5–5.2)
Alkaline Phosphatase: 43 U/L (ref 39–117)
Bilirubin, Direct: 0.1 mg/dL (ref 0.0–0.3)
Total Bilirubin: 0.9 mg/dL (ref 0.3–1.2)
Total Protein: 6.2 g/dL (ref 6.0–8.3)

## 2010-07-17 LAB — PSA: PSA: 1.26 ng/mL (ref 0.10–4.00)

## 2010-07-17 LAB — LIPID PANEL
Cholesterol: 221 mg/dL — ABNORMAL HIGH (ref 0–200)
HDL: 63.8 mg/dL (ref >39.00)
Total CHOL/HDL Ratio: 3
Triglycerides: 49 mg/dL (ref 0.0–149.0)
VLDL: 9.8 mg/dL (ref 0.0–40.0)

## 2010-07-17 LAB — LDL CHOLESTEROL, DIRECT: Direct LDL: 141.1 mg/dL

## 2010-07-17 LAB — TSH: TSH: 0.96 u[IU]/mL (ref 0.35–5.50)

## 2010-07-22 NOTE — Assessment & Plan Note (Signed)
Summary: CPX/RSC FROM 11/25   Vital Signs:  Patient profile:   50 year old male Height:      69 inches Weight:      150.50 pounds BMI:     22.31 O2 Sat:      99 % on room air Temp:     99.0 degrees F oral Pulse rate:   82 / minute BP sitting:   100 / 60  (left arm) Cuff size:   regular  Vitals Entered By: Randell Loop CMA (July 17, 2010 11:37 AM)  O2 Sat at Rest %:  99 O2 Flow:  room air CC: 15 month ROV & CPX... Is Patient Diabetic? No Pain Assessment Patient in pain? no      Comments meds updated today with pt   CC:  15 month ROV & CPX....  History of Present Illness: 50 y/o WM here for a follow up visit and CPX...  he has multiple medical problems as noted below...     ~  April 18, 2009:  he was hosp 10/10 for "syncope vs seizure" w/ neg eval & episode believed to be due to meds for migraine headaches (DrHagen at the [facility]) + Vicodin he received from DrNorris after right rotator cuff surg 8/10... no other issues this past yr... he's already had his 2010 flu shot & we will give him a Tdap booster today.   ~  July 17, 2010:    Randy Moran had a recent sinus infection- treated by TP w/ Augmentin, Mucinex, NettiPot etc- but symptoms are recurrent & we discussed the need for anti-inflamm Rx (Depo, Pred, etc).    He underwent right shoulder surg x2 by DrNorris in 2010 & 2011 for rotator cuff problem & he still has some pain in this area.    He still sees [facility]agen [facility] but she's simplified his regimen- just using Relpax Prn...   Current Problem List:  PHYSICAL EXAMINATION (ICD-V70.0) - he takes ASA 81mg /d...  ~  Colon: mother had colon cancer- colonoscopy 4/09 by Dorris Singh w/ 3mm hyperpl polyp...  ~  Immunizations:  he gets the yearly Flu vaccines;  and had TDAP 11/10...  Hx of ASTHMATIC BRONCHITIS, ACUTE (ICD-466.0) - AB exac in 2009 w/ Biaxin, Prednisone, Tussionex... finally resolved and doesn't require regular inhalers so far... he also had a sinusitis  & dizzy eval by [facility] w/ CT Sinuses showing clear x sm amt of fluid in sphenoid sinus- he states all symptoms resolved w/ the Pred Rx...  HYPERLIPIDEMIA (ICD-272.4) - prev on Simvastatin 20mg /d, but pt stopped due to "feeling bad" & now on diet alone...  ~  FLP 4/07 showed TChol 238, TG 64, HDL 56, LDL 171... rec> try Simva20... pt dc'd on his own.  ~  FLP 8/08 showed TChol 230, TG 65, HDL 52, LDL 176... rec> vigorous diet efforts, refuses med.  ~  FLP 11/08 showed TChol 173, TG 61, HDL 55, LDL 106... fantastic job w/ diet, add exercise.  ~  FLP 10/09 showed TChol 176, TG 50, HDL 60, LDL 106  ~  FLP 11/10 showed TChol 238, TG 52, HDL 61, LDL 170... what happened? he still prefers diet Rx.  ~  FLP 2/12 showed TChol 221, TG 49, HDL 64, LDL 141... discussed diet + exercise, he refuses meds.  GERD (ICD-530.81) - on PROTONIX 40mg /d Prn... followed by Dorris Singh for GI... he was in a GI drug study of reflux & the study ended... last EGD was 9/08  showing esophagitis... he is maintained on PROTONIX 40mg /d & doing well symptomatically.  COLONIC POLYPS (ICD-211.3) - colonoscopy 4/09 by Dorris Singh showed divertics & 3mm polyp= hyperplastic... f/u planned ?10 yrs (there is a + FamHx of colon cancer in his mother).  ? of PROSTATITIS NOS (ICD-601.9) - treated by [facility] in [facility] for prostatitis... pt had f/u w/ Urology in 2010 and everything was fine.  DEGENERATIVE JOINT DISEASE (ICD-715.90) - prev evals by Drs Bary Leriche and Baldwin w/ surgery to both shoulders, knee x2, and wrist... he has also seen a chiropractor- DrWillen, w/ CSpine problems...  LOW BACK PAIN (ICD-724.2) MYOFASCIAL PAIN SYNDROME (ICD-729.1) - he has seen DrZeminski in the past for ? fibromyalgia and lumbar DDD  HEADACHE (ICD-784.0) - [facility]... felt to be migraine headaches and treated w/ Topamax, Baclofen, RELPAX, Tylenol & Ibuprofen for Prn use...   ~  Note: also saw  DrStiefel in 1983 for severe incapacitating headaches...  ~  Hosp 10/10 w/ altered mental status prob related to meds + Vicodin given for shoulder surg... resolved off meds & f/u w/ DrHagen to re-assess his migraines> just using Prn Relax now.  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  ~  EEG 10/10 was normal/ neg...  ~  MRI/ MRA Brain 10/10 showed NAD, scattered subcortical T2 hyperintensities are sl greater in number than expected for age, MRA= normal.   ~  CT CSpine 10/10 shows DDD & spondylosis lower CSpine...  ~  CDopplers 10/10 were WNL...  ~  2DEcho 10/10 was normal w/ EF= 60-65% & no wall motion abn...  ANXIETY (ICD-300.00)  SYMPTOM, INSOMNIA NOS (ICD-780.52)   Preventive Screening-Counseling & Management  Alcohol-Tobacco     Smoking Status: never  Allergies: 1)  ! Bactrim Ds (Sulfamethoxazole-Trimethoprim) 2)  ! Levaquin  Comments:  Nurse/Medical Assistant: The patient's medications and allergies were reviewed with the patient and were updated in the Medication and Allergy Lists.  Past History:  Past Medical History: ALLERGY (ICD-995.3) PHYSICAL EXAMINATION (ICD-V70.0) Hx of ASTHMATIC BRONCHITIS, ACUTE (ICD-466.0) HYPERLIPIDEMIA (ICD-272.4) GERD (ICD-530.81) COLONIC POLYPS (ICD-211.3) - Hyperplastic polyps 2009 & +hx colon cancer in mother. ? of PROSTATITIS NOS (ICD-601.9) DEGENERATIVE JOINT DISEASE (ICD-715.90) LOW BACK PAIN (ICD-724.2) MYOFASCIAL PAIN SYNDROME (ICD-729.1) HEADACHE (ICD-784.0) Hx of ALTERED MENTAL STATUS (ICD-780.97) ANXIETY (ICD-300.00) SYMPTOM, INSOMNIA NOS (ICD-780.52)  Past Surgical History: S/P bilat Inguinal herniorrhaphy in the 1960's S/P Appendectomy in 1967 S/P knee surgery x2 in 2004 & 2008 S/P bilat shoulder surgery in 2006 & 2007 & 2010 S/P ganglion cyst surgery in 2009  Family History: Reviewed history from 03/22/2007 and no changes  required. Family History of Colon CA 1st degree relative <60 Family History of CAD Male 1st degree relative <50 Family History High cholesterol Family History Hypertension  Social History: Reviewed history from 12/10/2008 and no changes required. 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  The patient denies fever, chills, sweats, anorexia, fatigue, weakness, weight loss, sleep disorder, blurring, diplopia, eye irritation, eye discharge, vision loss, eye pain, photophobia, earache, ear discharge, tinnitus, decreased hearing, nosebleeds, sore throat, hoarseness, chest pain, palpitations, syncope, orthopnea, PND, peripheral edema, cough, dyspnea at rest, excessive sputum, hemoptysis, wheezing, pleurisy, nausea, vomiting, diarrhea, constipation, change in  bowel habits, abdominal pain, melena, hematochezia, jaundice, gas/bloating, dysphagia, odynophagia, dysuria, hematuria, urinary frequency, urinary hesitancy, nocturia, incontinence, back pain, joint pain, joint swelling, muscle cramps, muscle weakness, stiffness, arthritis, sciatica, restless legs, leg pain at night, leg pain with exertion, rash, itching, dryness, suspicious lesions, paralysis, seizures, tremors, vertigo, transient blindness, frequent falls, frequent headaches, difficulty walking, depression, memory loss, confusion, cold intolerance, heat intolerance, polydipsia, polyphagia, polyuria, unusual weight change, abnormal bruising, bleeding, enlarged lymph nodes, urticaria, allergic rash, hay fever, and recurrent infections.    Physical Exam  Additional Exam:  WD, WN, 50 y/o WM in NAD... GENERAL:  Alert & oriented; pleasant & cooperative... HEENT:  Celeste/AT, EOM-wnl, PERRLA, Fundi-benign, EACs-clear, TMs-wnl, NOSE-clear, THROAT-clear & wnl. NECK:  Supple w/ fairROM; no JVD;  normal carotid impulses w/o bruits; no thyromegaly or nodules palpated; no lymphadenopathy. CHEST:  Clear to P & A; without wheezes/ rales/ or rhonchi. HEART:  Regular Rhythm; without murmurs/ rubs/ or gallops. ABDOMEN:  Soft & nontender; normal bowel sounds; no organomegaly or masses detected. RECTAL:  Neg - prostate 2+ & nontender w/o nodules; stool hematest neg. EXT: without deformities, mild arthritic changes; no varicose veins/ venous insuffic/ or edema. scars of shoulder surgeries... NEURO:  CN's intact; motor testing normal; sensory testing normal; gait normal & balance OK. DERM:  No lesions noted; no rash etc...    Impression & Recommendations:  Problem # 1:  PHYSICAL EXAMINATION (ICD-V70.0) We reviewed Labs, CXR, EKG, etc... Orders: EKG w/ Interpretation (93000) T-2 View CXR (71020TC) TLB-BMP (Basic Metabolic Panel-BMET) (80048-METABOL) TLB-Hepatic/Liver Function Pnl (80076-HEPATIC) TLB-CBC Platelet - w/Differential (85025-CBCD) TLB-Lipid Panel (80061-LIPID) TLB-TSH (Thyroid Stimulating Hormone) (84443-TSH) TLB-PSA (Prostate Specific Antigen) (84153-PSA)  Problem # 2:  PULM >>> He has hx AB & recent URI, allergy symptoms, sinus prob>  better after Augmentin, Mucinex, NettiPot, etc... but not resolved & we discussed need for Depo/ Pred taper & he is in agreement...  Problem # 3:  HYPERLIPIDEMIA (ICD-272.4) Not at goal but improved and h3 reiterates his desire to stay off Chol meds on diet alone... we reviewed low chol, low fat diet & exercise program...  Problem # 4:  GERD (ICD-530.81) Stable on PPI medication... cointinue same. His updated medication list for this problem includes:    Protonix 40 Mg Tbec (Pantoprazole sodium) .Marland Kitchen... 1 by mouth once daily as needed  Problem # 5:  DEGENERATIVE JOINT DISEASE (ICD-715.90) He has DJD, LBP, prev shoulder surg... we discussed exerice program, topical heat, etc... His updated medication list for this problem includes:     Aspirin Ec 81 Mg Tbec (Aspirin) .Marland Kitchen... Take 1 tab by mouth once daily...  Problem # 6:  HEADACHE (ICD-784.0) Eval & Rx from DrHagen, [facility] management... His updated medication list for this problem includes:    Aspirin Ec 81 Mg Tbec (Aspirin) .Marland Kitchen... Take 1 tab by mouth once daily...    Relpax 40 Mg Tabs (Eletriptan hydrobromide) .Marland Kitchen... As needed migraines  Problem # 7:  ANXIETY (ICD-300.00) He does not desire anxiolytic Rx...  Problem # 8:  OTHER MEDICAL PROBLEMS AS NOTED>>>  Complete Medication List: 1)  Aspirin Ec 81 Mg Tbec (Aspirin) .... Take 1 tab by mouth once daily.Marland KitchenMarland Kitchen 2)  Protonix 40 Mg Tbec (Pantoprazole sodium) .Marland Kitchen.. 1 by mouth once daily as needed 3)  Relpax 40 Mg Tabs (Eletriptan hydrobromide) .... As needed migraines 4)  Prednisone 20 Mg Tabs (Prednisone) .... Take 1 tab by mouth two times a day x4d, then 1 tab daily x4d, then 1/2 tab daily x4d, then  1/2 tab every other day til gone...  Other Orders: Depo- Medrol 80mg  (J1040) Admin of Therapeutic Inj  intramuscular or subcutaneous (04540)  Patient Instructions: 1)  Today we updated your med list- see below.... 2)  We decided to treat the upper airway inflamm w/ a Depo shot & Prednisone course (follow lable directions)... 3)  Today we did your follow up CXR, EKG, & FASTING blood work... please call the "phone tree" in a few days for your lab results.Marland KitchenMarland Kitchen 4)  Call for any questions.Marland KitchenMarland Kitchen 5)  Please schedule a follow-up appointment in 1 year, sooner as needed. Prescriptions: PREDNISONE 20 MG TABS (PREDNISONE) take 1 tab by mouth two times a day x4d, then 1 tab daily x4d, then 1/2 tab daily x4d, then 1/2 tab every other day til gone...  #16 x 0   Entered and Authorized by:   Michele Mcalpine MD   Signed by:   Michele Mcalpine MD on 07/17/2010   Method used:   Print then Give to Patient   RxID:   [id number] PROTONIX 40 MG  TBEC (PANTOPRAZOLE SODIUM) 1 by mouth once daily as needed Brand medically necessary #90 x 4   Entered and  Authorized by:   Michele Mcalpine MD   Signed by:   Michele Mcalpine MD on 07/17/2010   Method used:   Print then Give to Patient   RxID:   [id number]    Medication Administration  Injection # 1:    Medication: Depo- Medrol 80mg     Diagnosis: UPPER RESPIRATORY INFECTION (ICD-465.9)    Route: IM    Site: LUOQ gluteus    Exp Date: 12/2012    Lot #: [id number]    Mfr: Pharmacia    Patient tolerated injection without complications    Given by: Randell Loop CMA (July 17, 2010 12:33 PM)  Orders Added: 1)  EKG w/ Interpretation [93000] 2)  Est. Patient 40-64 years [99396] 3)  T-2 View CXR [71020TC] 4)  Depo- Medrol 80mg  [J1040] 5)  Admin of Therapeutic Inj  intramuscular or subcutaneous [96372] 6)  TLB-BMP (Basic Metabolic Panel-BMET) [80048-METABOL] 7)  TLB-Hepatic/Liver Function Pnl [80076-HEPATIC] 8)  TLB-CBC Platelet - w/Differential [85025-CBCD] 9)  TLB-Lipid Panel [80061-LIPID] 10)  TLB-TSH (Thyroid Stimulating Hormone) [84443-TSH] 11)  TLB-PSA (Prostate Specific Antigen) [13244-WNU]       Medication Administration  Injection # 1:    Medication: Depo- Medrol 80mg     Diagnosis: UPPER RESPIRATORY INFECTION (ICD-465.9)    Route: IM    Site: LUOQ gluteus    Exp Date: 12/2012    Lot #: [id number]    Mfr: Pharmacia    Patient tolerated injection without complications    Given by: Randell Loop CMA (July 17, 2010 12:33 PM)  Orders Added: 1)  EKG w/ Interpretation [93000] 2)  Est. Patient 40-64 years [99396] 3)  T-2 View CXR [71020TC] 4)  Depo- Medrol 80mg  [J1040] 5)  Admin of Therapeutic Inj  intramuscular or subcutaneous [96372] 6)  TLB-BMP (Basic Metabolic Panel-BMET) [80048-METABOL] 7)  TLB-Hepatic/Liver Function Pnl [80076-HEPATIC] 8)  TLB-CBC Platelet - w/Differential [85025-CBCD] 9)  TLB-Lipid Panel [80061-LIPID] 10)  TLB-TSH (Thyroid Stimulating Hormone) [84443-TSH] 11)  TLB-PSA (Prostate Specific Antigen) [27253-GUY]

## 2010-09-17 LAB — CBC
HCT: 42.1 % (ref 39.0–52.0)
HCT: 45.7 % (ref 39.0–52.0)
Hemoglobin: 14.3 g/dL (ref 13.0–17.0)
Hemoglobin: 15.3 g/dL (ref 13.0–17.0)
MCHC: 33.5 g/dL (ref 30.0–36.0)
MCHC: 34 g/dL (ref 30.0–36.0)
MCV: 94.9 fL (ref 78.0–100.0)
MCV: 95.1 fL (ref 78.0–100.0)
Platelets: 210 10*3/uL (ref 150–400)
Platelets: 238 10*3/uL (ref 150–400)
RBC: 4.43 MIL/uL (ref 4.22–5.81)
RBC: 4.8 MIL/uL (ref 4.22–5.81)
RDW: 11.7 % (ref 11.5–15.5)
RDW: 11.8 % (ref 11.5–15.5)
WBC: 4.9 10*3/uL (ref 4.0–10.5)
WBC: 6.3 10*3/uL (ref 4.0–10.5)

## 2010-09-17 LAB — CK TOTAL AND CKMB (NOT AT ARMC)
CK, MB: 0.5 ng/mL (ref 0.3–4.0)
Relative Index: INVALID (ref 0.0–2.5)
Total CK: 47 U/L (ref 7–232)

## 2010-09-17 LAB — URINALYSIS, ROUTINE W REFLEX MICROSCOPIC
Bilirubin Urine: NEGATIVE
Glucose, UA: NEGATIVE mg/dL
Hgb urine dipstick: NEGATIVE
Ketones, ur: NEGATIVE mg/dL
Nitrite: NEGATIVE
Protein, ur: NEGATIVE mg/dL
Specific Gravity, Urine: 1.007 (ref 1.005–1.030)
Urobilinogen, UA: 0.2 mg/dL (ref 0.0–1.0)
pH: 7.5 (ref 5.0–8.0)

## 2010-09-17 LAB — DIFFERENTIAL
Basophils Absolute: 0 10*3/uL (ref 0.0–0.1)
Basophils Absolute: 0 10*3/uL (ref 0.0–0.1)
Basophils Relative: 0 % (ref 0–1)
Basophils Relative: 0 % (ref 0–1)
Eosinophils Absolute: 0.2 10*3/uL (ref 0.0–0.7)
Eosinophils Absolute: 0.2 10*3/uL (ref 0.0–0.7)
Eosinophils Relative: 3 % (ref 0–5)
Eosinophils Relative: 4 % (ref 0–5)
Lymphocytes Relative: 35 % (ref 12–46)
Lymphocytes Relative: 35 % (ref 12–46)
Lymphs Abs: 1.7 10*3/uL (ref 0.7–4.0)
Lymphs Abs: 2.2 10*3/uL (ref 0.7–4.0)
Monocytes Absolute: 0.2 10*3/uL (ref 0.1–1.0)
Monocytes Absolute: 0.4 10*3/uL (ref 0.1–1.0)
Monocytes Relative: 5 % (ref 3–12)
Monocytes Relative: 6 % (ref 3–12)
Neutro Abs: 2.8 10*3/uL (ref 1.7–7.7)
Neutro Abs: 3.5 10*3/uL (ref 1.7–7.7)
Neutrophils Relative %: 56 % (ref 43–77)
Neutrophils Relative %: 56 % (ref 43–77)

## 2010-09-17 LAB — POCT I-STAT, CHEM 8
BUN: 8 mg/dL (ref 6–23)
Calcium, Ion: 1.13 mmol/L (ref 1.12–1.32)
Chloride: 110 mEq/L (ref 96–112)
Creatinine, Ser: 0.8 mg/dL (ref 0.4–1.5)
Glucose, Bld: 99 mg/dL (ref 70–99)
HCT: 41 % (ref 39.0–52.0)
Hemoglobin: 13.9 g/dL (ref 13.0–17.0)
Potassium: 3.2 mEq/L — ABNORMAL LOW (ref 3.5–5.1)
Sodium: 141 mEq/L (ref 135–145)
TCO2: 20 mmol/L (ref 0–100)

## 2010-09-17 LAB — ETHANOL: Alcohol, Ethyl (B): <5 mg/dL (ref 0–10)

## 2010-09-17 LAB — GLUCOSE, CAPILLARY
Glucose-Capillary: 100 mg/dL — ABNORMAL HIGH (ref 70–99)
Glucose-Capillary: 102 mg/dL — ABNORMAL HIGH (ref 70–99)
Glucose-Capillary: 106 mg/dL — ABNORMAL HIGH (ref 70–99)
Glucose-Capillary: 108 mg/dL — ABNORMAL HIGH (ref 70–99)
Glucose-Capillary: 124 mg/dL — ABNORMAL HIGH (ref 70–99)
Glucose-Capillary: 81 mg/dL (ref 70–99)
Glucose-Capillary: 81 mg/dL (ref 70–99)
Glucose-Capillary: 83 mg/dL (ref 70–99)
Glucose-Capillary: 84 mg/dL (ref 70–99)
Glucose-Capillary: 87 mg/dL (ref 70–99)
Glucose-Capillary: 88 mg/dL (ref 70–99)
Glucose-Capillary: 88 mg/dL (ref 70–99)
Glucose-Capillary: 96 mg/dL (ref 70–99)
Glucose-Capillary: 99 mg/dL (ref 70–99)

## 2010-09-17 LAB — BASIC METABOLIC PANEL
BUN: 7 mg/dL (ref 6–23)
CO2: 24 mEq/L (ref 19–32)
Calcium: 9 mg/dL (ref 8.4–10.5)
Chloride: 108 mEq/L (ref 96–112)
Creatinine, Ser: 0.88 mg/dL (ref 0.4–1.5)
GFR calc Af Amer: >60 mL/min (ref >60)
GFR calc non Af Amer: >60 mL/min (ref >60)
Glucose, Bld: 97 mg/dL (ref 70–99)
Potassium: 3.8 mEq/L (ref 3.5–5.1)
Sodium: 142 mEq/L (ref 135–145)

## 2010-09-17 LAB — RAPID URINE DRUG SCREEN, HOSP PERFORMED
Amphetamines: NOT DETECTED
Barbiturates: NOT DETECTED
Benzodiazepines: POSITIVE — AB
Cocaine: NOT DETECTED
Opiates: NOT DETECTED
Tetrahydrocannabinol: NOT DETECTED

## 2010-09-17 LAB — TSH: TSH: 0.884 u[IU]/mL (ref 0.350–4.500)

## 2010-09-17 LAB — PROLACTIN: Prolactin: 5.5 ng/mL (ref 2.1–17.1)

## 2010-10-27 NOTE — Assessment & Plan Note (Signed)
[facility]                         GASTROENTEROLOGY OFFICE NOTE   HAWARD, POPE                    MRN:          [id number]  DATE:05/23/2007                            DOB:          Dec 06, 1960    PROBLEM:  Reflux.   [name] has returned for scheduled GI followup.  On a regimen of  daily Protonix, he is symptom free.  Specifically, he is without  pyrosis, coughing, or dysphagia. He has a history of esophagitis.   EXAM:  Pulse 68, blood pressure 104/70, weight 168.   IMPRESSION:  Gastroesophageal reflux disease - well controlled with  Protonix.   RECOMMENDATIONS:  Continue Protonix, though I advised him to try taking  it every other day or every third day.  He will return as needed.     [name], MD,FACG  Electronically Signed    RDK/MedQ  DD: 05/23/2007  DT: 05/23/2007  Job #: [id number]

## 2010-10-27 NOTE — Assessment & Plan Note (Signed)
[facility]                         GASTROENTEROLOGY OFFICE NOTE   Randy Moran                    MRN:          [id number]  DATE:12/08/2006                            DOB:          Oct 02, 1960    PROBLEM:  Chest pain.   Randy Moran is a 50 year old white male referred through the courtesy of  Dr. Kriste Basque for evaluation.  For the past 2 years he has had intermittent  chest pain.  Symptoms clearly have worsened over the past 4 to 6 weeks,  whereby he is having almost daily pain.  The pain is described as sharp  pain in the mid chest and lower chest without radiation.  It may last  hours at a time.  He may awaken with pain.  It is not exertional.  He is  taking over-the-counter Prilosec with some relief in the past, though  not recently.  He is on no gastric irritants, including nonsteroidals.  Gaviscon has afforded minimal relief.  He denies dysphagia.  He  apparently underwent a cardiac workup that was negative.  He has  occasional pyrosis per se.   PAST MEDICAL HISTORY:  Pertinent for colon polyps.  Last colonoscopy was  in 2004.  Remarkable for herniorrhaphy and appendectomy.   FAMILY HISTORY:  Positive for mother and maternal uncle with colon  cancer, and a sister with polyps.   MEDICATIONS:  He is on no regular medications.   ALLERGIES:  SULFA.   SOCIAL HISTORY:  He neither smokes nor drinks.  He is married and works  as an Dietitian.   REVIEW OF SYSTEMS:  Positive for back pain.   EXAM:  Pulse 76, blood pressure 112/72, weight 164.  HEENT:  EOMI. PERRLA. Sclerae are anicteric.  Conjunctivae are pink.  NECK:  Supple without thyromegaly, adenopathy or carotid bruits.  CHEST:  Clear to auscultation and percussion without adventitious  sounds.  CARDIAC:  Regular rhythm; normal S1 S2.  There are no murmurs, gallops  or rubs.  ABDOMEN:  Bowel sounds are normoactive.  Abdomen is soft, non-tender and  non-distended.  There  are no abdominal masses, tenderness, splenic  enlargement or hepatomegaly.  EXTREMITIES:  Full range of motion.  No cyanosis, clubbing or edema.  RECTAL:  Deferred.   IMPRESSION:  Chest pain.  Symptoms certainly are compatible with  esophagitis.  Esophageal spasm is another consideration, likely  secondary to reflux.   RECOMMENDATION:  1. Upper endoscopy.  2. The patient will consider enrollment in the GERD trial.  Failing      that, I will place him on Zegerid 40 mg a day.     [name], MD,FACG  Electronically Signed    RDK/MedQ  DD: 12/08/2006  DT: 12/08/2006  Job #: [id number]   cc:   Veverly Fells. Excell Seltzer, MD

## 2010-10-30 NOTE — Letter (Signed)
September 23, 2006    Lonzo Cloud. Kriste Basque, MD  [address]  [place], [place] [address]   RE:  TELLY, JAWAD  MRN:  [id number]  /  DOB:  02-05-1961   Dear Lorin Picket:   It was my pleasure to see Naksh Radi as an outpatient at the  [facility] on September 23, 2006.  As you know, he is a 50 year old  gentleman who was referred for evaluation of syncope.   Mr. Gabay has no past cardiac history.  Approximately 2 weeks ago he  woke up with a headache.  He felt like he had a viral illness.  He took  Excedrin in the morning, and again at noon for a persistent headache.  He also did not eat well that morning.  He says that he drank a can of  Red Bull in the afternoon because he was feeling tired.  He went to  dinner that evening, and came back and continued to feel dizzy and weak.  When he was walking back to his bed from the bathroom he began to feel  diaphoretic and lightheaded, and he ultimately passed out.  His wife  witnessed the event.  He woke up quickly and remembers her standing over  him afterwards.  There was no loss of bowel or bladder function.  There  was no seizure-like activity.  He laid in bed for approximately 1 hour,  and then felt better at that point.  He recovered the following morning,  and while he felt a little bit ill, overall improved that day, and has  felt well since.  When he woke up from passing out, his blood pressure  was apparently by his wife's check.   He has had no prior history of syncope.  He has had no chest pain,  dyspnea, orthopnea, PND in the past.  He does have occasional  lightheadedness that is more of a dizziness sensation.  He mostly has  this problem when he takes aspirin, and relates it to its use.   CURRENT MEDICATIONS:  None.   ALLERGIES:  NONE, BUT ASPIRIN INTOLERANT DUE TO DIZZY FEELINGS.   PAST MEDICAL HISTORY:  1. Two hernia operations in the 1960s.  2. Appendectomy in 1967.  3. Two shoulder operations in 2006 and 2007.  4. Two  knee operations in 2004, and most recently in February 2008.   He has had no other hospitalizations besides for his surgical  procedures.  He does not have diabetes, hypertension, or dyslipidemia.   FAMILY HISTORY:  His mother died at age 31 of colon cancer and lung  problems.  His father is age 68, and has no medical problems.  His  sister is age 31, and is healthy.   SOCIAL HISTORY:  The patient is married.  He lives locally.  He works as  an Dietitian for [employer].  He does not smoke cigarettes,  drink alcohol, or use illicit drugs.  He drinks rare caffeine.  He  exercises fairly regularly between walking, using an elliptical machine,  and some biking.   A complete 12-point review of systems was performed.  Pertinent  positives included headaches and fatigue, otherwise all systems were  negative.   PHYSICAL EXAMINATION:  The patient is alert and oriented.  He is in no  acute distress.  He is a healthy-appearing man.  Weight is 166 pounds.  Blood pressure is 140/89.  Heart rate is 88.  Orthostatic blood pressures showed a heart rate of  101, and blood  pressure of 128/90 in the supine position.  Heart rate of 88 with blood  pressure 140/89 in the sitting position, and at 2 minutes of standing,  the heart rate was 84 with a blood pressure of 139/89.  HEENT:  Normal.  NECK:  Normal carotid upstrokes without bruits.  Jugular venous pressure  is normal.  No thyromegaly or thyroid nodules.  LUNGS:  Clear to auscultation bilaterally.  HEART:  Regular rate and rhythm without murmurs or gallops.  The apex is  discrete and nondisplaced.  There is no right ventricular heave or lift.  ABDOMEN:  Soft and non-tender.  No organomegaly.  No abdominal bruits.  EXTREMITIES:  No clubbing, cyanosis, or edema.  Pulses are 2+ and equal  throughout.  There are no femoral arterial bruits.  NEUROLOGIC:  Strength is 5/5 and equal in the arms and legs.  Cranial  nerves 2 through 12 are  intact.  EKG:  Shows normal sinus rhythm, and is within normal limits with a  heart rate of 86.   ASSESSMENT:  Mr. Memoli is a 50 year old male that had an episode of  syncope in the setting of a nonspecific illness.  I think his syncopal  episode was related to hypotension, and was possibly a neuro depressor  episode from his nausea and mild volume depletion.  He has normal EKG,  and by exam his heart is likely structurally normal.  I do not think he  requires further testing at this point with echocardiography or stress  testing because I think the yield is relatively low in those tests for  syncope in an otherwise healthy person.  I do think if Mr. Lofgren has a  recurrent episode he will require some evaluation with noninvasive  testing.  I asked him that if he develops progressive lightheadedness,  or has another syncopal spell, to please contact the office, and I would  like to see him back for further evaluation.  Otherwise, he will follow  up on a p.r.n. basis.   Scott, thanks again for allowing me to see Mr. Schertzer.  I would be  happy to see him at anytime in the future.  Please feel free to call at  anytime with questions regarding his care.    Sincerely,      Veverly Fells. Excell Seltzer, MD  Electronically Signed    MDC/MedQ  DD: 09/23/2006  DT: 09/23/2006  Job #: [id number]

## 2010-12-31 ENCOUNTER — Other Ambulatory Visit: Payer: Self-pay | Admitting: Chiropractic Medicine

## 2010-12-31 ENCOUNTER — Ambulatory Visit
Admission: RE | Admit: 2010-12-31 | Discharge: 2010-12-31 | Disposition: A | Discharge status: Home / Self Care | Payer: BC Managed Care – PPO | Source: Ambulatory Visit | Attending: Chiropractic Medicine | Admitting: Chiropractic Medicine

## 2010-12-31 DIAGNOSIS — M5416 Radiculopathy, lumbar region: Secondary | ICD-10-CM

## 2010-12-31 DIAGNOSIS — M5412 Radiculopathy, cervical region: Secondary | ICD-10-CM

## 2011-03-26 ENCOUNTER — Encounter: Payer: Self-pay | Admitting: Adult Health

## 2011-03-26 ENCOUNTER — Ambulatory Visit (INDEPENDENT_AMBULATORY_CARE_PROVIDER_SITE_OTHER): Payer: BC Managed Care – PPO | Admitting: Adult Health

## 2011-03-26 VITALS — BP 114/70 | HR 73 | Temp 99.2°F | Ht 69.0 in | Wt 149.8 lb

## 2011-03-26 DIAGNOSIS — J019 Acute sinusitis, unspecified: Secondary | ICD-10-CM

## 2011-03-26 MED ORDER — AMOXICILLIN-POT CLAVULANATE 875-125 MG PO TABS: 1.0000 | ORAL_TABLET | Freq: Two times a day (BID) | ORAL | Status: AC

## 2011-03-26 NOTE — Patient Instructions (Signed)
Augmentin 875mg  Twice daily  For 10 days , take with food.  Mucinex Twice daily  As needed  For congestion  Saline nasal rinses As needed   Please contact office for sooner follow up if symptoms do not improve or worsen or seek emergency care

## 2011-03-26 NOTE — Assessment & Plan Note (Signed)
Augmentin 875mg Twice daily  For 10 days , take with food.  Mucinex Twice daily  As needed  For congestion  Saline nasal rinses As needed   Please contact office for sooner follow up if symptoms do not improve or worsen or seek emergency care   

## 2011-03-26 NOTE — Progress Notes (Signed)
  Subjective:    Patient ID: Randy Moran, male    DOB: [birth year], 50 y.o.   MRN: [id number]  HPI 50 yo male   03/26/2011 Acute OV Complains of sinus infection with sinus pressure/congestion, yellow drainage, PND at night, HA and fluid in both ears x3weeks.  Has been using nettipot, afrin and mucinex.  No chest pain or dyspnea. No recent travel or abx use.   Review of Systems Constitutional:   No  weight loss, night sweats,  Fevers, chills, fatigue, or  lassitude.  HEENT:   No headaches,  Difficulty swallowing,  Tooth/dental problems, or  Sore throat,                +sneezing, itching, ear ache, nasal congestion, post nasal drip,   CV:  No chest pain,  Orthopnea, PND, swelling in lower extremities, anasarca, dizziness, palpitations, syncope.   GI  No heartburn, indigestion, abdominal pain, nausea, vomiting, diarrhea, change in bowel habits, loss of appetite, bloody stools.   Resp  coughing up of blood.  No change in color of mucus.  No wheezing.  No chest wall deformity  Skin: no rash or lesions.  GU: no dysuria, change in color of urine, no urgency or frequency.  No flank pain, no hematuria   MS:  No joint pain or swelling.  No decreased range of motion   Psych:  No change in mood or affect. No depression or anxiety.  No memory loss.         Objective:   Physical Exam GEN: A/Ox3; pleasant , NAD, well nourished   HEENT:  [place]/AT,  EACs-clear, TMs-wnl, NOSE-clear drainage , max tenderness , THROAT-clear, no lesions, no postnasal drip or exudate noted.   NECK:  Supple w/ fair ROM; no JVD; normal carotid impulses w/o bruits; no thyromegaly or nodules palpated; no lymphadenopathy.  RESP  Clear  P & A; w/o, wheezes/ rales/ or rhonchi.no accessory muscle use, no dullness to percussion  CARD:  RRR, no m/r/g  , no peripheral edema, pulses intact, no cyanosis or clubbing.  GI:   Soft & nt; nml bowel sounds; no organomegaly or masses detected.  Musco: Warm bil, no  deformities or joint swelling noted.   Neuro: alert, no focal deficits noted.    Skin: Warm, no lesions or rashes         Assessment & Plan:

## 2011-05-18 ENCOUNTER — Telehealth: Payer: Self-pay | Admitting: Pulmonary Disease

## 2011-05-18 NOTE — Telephone Encounter (Signed)
Called and spoke with pt and per SN--ok to add on for 2:30 on wed.  Called and spoke with pt and he is aware of appt.

## 2011-05-18 NOTE — Telephone Encounter (Signed)
I spoke with pt and he c/o right ear "gurgling", very bad headache, nausea not vomiting, very little dry cough, sore throat, body aches x yesterday. Pt denies any fever, wheezing, chest tightness, chest congestion. Pt is requesting recs from Dr. Kriste Basque. Please advise thanks  Allergies  Allergen Reactions  . Levofloxacin     REACTION: rash  . Sulfamethoxazole W/Trimethoprim     REACTION: rash   b

## 2011-05-19 ENCOUNTER — Encounter: Payer: Self-pay | Admitting: *Deleted

## 2011-05-19 ENCOUNTER — Ambulatory Visit (INDEPENDENT_AMBULATORY_CARE_PROVIDER_SITE_OTHER): Payer: BC Managed Care – PPO | Admitting: Pulmonary Disease

## 2011-05-19 ENCOUNTER — Encounter: Payer: Self-pay | Admitting: Pulmonary Disease

## 2011-05-19 ENCOUNTER — Other Ambulatory Visit (INDEPENDENT_AMBULATORY_CARE_PROVIDER_SITE_OTHER): Payer: BC Managed Care – PPO

## 2011-05-19 VITALS — BP 122/84 | HR 108 | Temp 97.9°F | Ht 69.0 in | Wt 149.0 lb

## 2011-05-19 DIAGNOSIS — IMO0001 Reserved for inherently not codable concepts without codable children: Secondary | ICD-10-CM

## 2011-05-19 DIAGNOSIS — K219 Gastro-esophageal reflux disease without esophagitis: Secondary | ICD-10-CM

## 2011-05-19 DIAGNOSIS — R519 Headache, unspecified: Secondary | ICD-10-CM

## 2011-05-19 DIAGNOSIS — R51 Headache: Secondary | ICD-10-CM

## 2011-05-19 DIAGNOSIS — F411 Generalized anxiety disorder: Secondary | ICD-10-CM

## 2011-05-19 DIAGNOSIS — E785 Hyperlipidemia, unspecified: Secondary | ICD-10-CM

## 2011-05-19 DIAGNOSIS — M791 Myalgia, unspecified site: Secondary | ICD-10-CM

## 2011-05-19 DIAGNOSIS — M1990 Unspecified osteoarthritis, unspecified site: Secondary | ICD-10-CM

## 2011-05-19 LAB — HEPATIC FUNCTION PANEL
ALT: 32 U/L (ref 0–53)
AST: 25 U/L (ref 0–37)
Albumin: 4.3 g/dL (ref 3.5–5.2)
Alkaline Phosphatase: 43 U/L (ref 39–117)
Bilirubin, Direct: 0.1 mg/dL (ref 0.0–0.3)
Total Bilirubin: 0.8 mg/dL (ref 0.3–1.2)
Total Protein: 7 g/dL (ref 6.0–8.3)

## 2011-05-19 LAB — CBC WITH DIFFERENTIAL/PLATELET
Basophils Absolute: 0 10*3/uL (ref 0.0–0.1)
Basophils Relative: 0.4 % (ref 0.0–3.0)
Eosinophils Absolute: 0.1 10*3/uL (ref 0.0–0.7)
Eosinophils Relative: 1.1 % (ref 0.0–5.0)
HCT: 45.8 % (ref 39.0–52.0)
Hemoglobin: 15.6 g/dL (ref 13.0–17.0)
Lymphocytes Relative: 33.5 % (ref 12.0–46.0)
Lymphs Abs: 2 10*3/uL (ref 0.7–4.0)
MCHC: 34.2 g/dL (ref 30.0–36.0)
MCV: 94.4 fl (ref 78.0–100.0)
Monocytes Absolute: 0.3 10*3/uL (ref 0.1–1.0)
Monocytes Relative: 5.6 % (ref 3.0–12.0)
Neutro Abs: 3.5 10*3/uL (ref 1.4–7.7)
Neutrophils Relative %: 59.4 % (ref 43.0–77.0)
Platelets: 187 10*3/uL (ref 150.0–400.0)
RBC: 4.85 Mil/uL (ref 4.22–5.81)
RDW: 12.4 % (ref 11.5–14.6)
WBC: 5.9 10*3/uL (ref 4.5–10.5)

## 2011-05-19 LAB — BASIC METABOLIC PANEL
BUN: 11 mg/dL (ref 6–23)
CO2: 29 mEq/L (ref 19–32)
Calcium: 9.3 mg/dL (ref 8.4–10.5)
Chloride: 106 mEq/L (ref 96–112)
Creatinine, Ser: 0.9 mg/dL (ref 0.4–1.5)
GFR: 97.38 mL/min (ref >60.00)
Glucose, Bld: 93 mg/dL (ref 70–99)
Potassium: 4 mEq/L (ref 3.5–5.1)
Sodium: 142 mEq/L (ref 135–145)

## 2011-05-19 LAB — TSH: TSH: 1.21 u[IU]/mL (ref 0.35–5.50)

## 2011-05-19 LAB — SEDIMENTATION RATE: Sed Rate: 7 mm/hr (ref 0–22)

## 2011-05-19 MED ORDER — PREDNISONE (PAK) 5 MG PO TABS: ORAL_TABLET | ORAL | Status: DC

## 2011-05-19 MED ORDER — METHYLPREDNISOLONE ACETATE 80 MG/ML IJ SUSP
80.0000 mg | Freq: Once | INTRAMUSCULAR | Status: AC
  Administered 2011-05-19: 80 mg via INTRAMUSCULAR

## 2011-05-19 MED ORDER — TRAMADOL HCL 50 MG PO TABS: 50.0000 mg | ORAL_TABLET | Freq: Three times a day (TID) | ORAL | Status: DC | PRN

## 2011-05-19 NOTE — Progress Notes (Signed)
Subjective:    Patient ID: Randy Moran, male    DOB: Aug 28, 1960, 50 y.o.   MRN: [id number]  HPI 50 y/o WM here for a follow up visit and CPX...  he has multiple medical problems as noted below...    ~  April 18, 2009:  he was hosp 10/10 for "syncope vs seizure" w/ neg eval & episode believed to be due to meds for migraine headaches (DrHagen at the [facility]) + Vicodin he received from DrNorris after right rotator cuff surg 8/10... no other issues this past yr... he's already had his 2010 flu shot & we will give him a Tdap booster today.  ~  July 17, 2010:  2mo ROV & Randy Moran had a recent sinus infection- treated by TP w/ Augmentin, Mucinex, NettiPot etc- but symptoms are recurrent & we discussed the need for anti-inflamm Rx (Depo, Pred, etc)...      He underwent right shoulder surg x2 by DrNorris in 2010 & 2011 for rotator cuff problem & he still has some pain in this area.    He still sees DrHagen at the [facility] but she's simplified his regimen- just using Relpax Prn...  ~  May 19, 2011:  50mo ROV & urgent add-on appt for severe headache> frontal, temporal & occipital too; continuous 8-9/10 severity; mult assoc symptoms> right ear pops, N w/o V, body aches but no f/c/s, legs hurt & hard to walk;  Relpax Rx per DrHagen w/o relief;  He saw DrHagen 9/12 & her note is reviewed> she was doing a trial of Botox for his chr HAs, plus his Relpax40mg  prn, & Mirapex for RLS; f/u planned 65mo...    We discussed Rx w/ Depo80, Pred Dosepak, Tamadol & Tylenol, w/ f/u ASAP with his Neurologist/ [facility] specialist DrHagen (now in [place]);  He reminds me that he cannot tolerate even the low dose Pred dosepak due to agitation, & is intol to the Tramadol as well;  He was intol to Vicodin prev w/ confusion req admission in 2010;  Therefore he will need to continue meds per DrHagen & follow up w/ her ASAP (we will call to set this up)...    EPIC review shows XRays of Neck (DDD at C6-7), Back (neg- normal alignment &  disc spaces), & pelvis (WNL) 7/12 by Randy Moran- his Chiropractor; no mention from the pt about chiropractic therapy or any signif HA relief from their adjustments... He has also seen DrRamos in the past w/ cervical disc dis & prev had injection...          Problem List:  PHYSICAL EXAMINATION (ICD-V70.0) - he takes ASA 81mg /d... ~  Colon: mother had colon cancer- colonoscopy 4/09 by Dorris Singh w/ 3mm hyperpl polyp... ~  Immunizations:  he gets the yearly Flu vaccines;  and had TDAP 11/10...  Hx of ASTHMATIC BRONCHITIS, ACUTE (ICD-466.0) - AB exac in 2009 w/ Biaxin, Prednisone, Tussionex... finally resolved and doesn't require regular inhalers so far... he also had a sinusitis & dizzy eval by [facility] w/ CT Sinuses showing clear x sm amt of fluid in sphenoid sinus- he states all symptoms resolved w/ the Pred Rx...  HYPERLIPIDEMIA (ICD-272.4) - prev on Simvastatin 20mg /d, but pt stopped due to "feeling bad" & now on diet alone... ~  FLP 4/07 showed TChol 238, TG 64, HDL 56, LDL 171... rec> try Simva20... pt dc'd on his own. ~  FLP 8/08 showed TChol 230, TG 65, HDL 52, LDL 176... rec> vigorous diet efforts, refuses med. ~  FLP 11/08 showed TChol 173, TG 61, HDL 55, LDL 106... fantastic job w/ diet, add exercise. ~  FLP 10/09 showed TChol 176, TG 50, HDL 60, LDL 106 ~  FLP 11/10 showed TChol 238, TG 52, HDL 61, LDL 170... what happened? he still prefers diet Rx. ~  FLP 2/12 showed TChol 221, TG 49, HDL 64, LDL 141... discussed diet + exercise, he refuses meds.  GERD (ICD-530.81) - on PROTONIX 40mg /d Prn... followed by Dorris Singh for GI... he was in a GI drug study of reflux & the study ended... last EGD was 9/08 showing esophagitis... he is maintained on PROTONIX 40mg /d & doing well symptomatically.  COLONIC POLYPS (ICD-211.3) - colonoscopy 4/09 by Dorris Singh showed divertics & 3mm polyp= hyperplastic... f/u planned ?10 yrs (there is a + FamHx of colon cancer in his mother).  ? of PROSTATITIS NOS  (ICD-601.9) - treated by [facility] in 1988 for prostatitis... pt had f/u w/ Urology in 2010 and everything was fine.  DEGENERATIVE JOINT DISEASE (ICD-715.90) - prev evals by Drs Bary Leriche and [facility] w/ surgery to both shoulders, knee x2, and wrist... he has also seen a chiropractor- DrWillen, w/ CSpine problems; & now [employer]...  LOW BACK PAIN (ICD-724.2) MYOFASCIAL PAIN SYNDROME (ICD-729.1) - he has seen DrZeminski in the past for ? fibromyalgia and lumbar DDD  HEADACHE (ICD-784.0) - [facility]... felt to be migraine headaches and treated w/ Topamax, Baclofen, RELPAX, Tylenol & Ibuprofen for Prn use...  ~  Note: also saw DrStiefel in 1983 for severe incapacitating headaches... ~  Hosp 10/10 w/ altered mental status prob related to meds + Vicodin given for shoulder surg... resolved off meds & f/u w/ DrHagen to re-assess his migraines> just using Prn Relax now.  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 ~  EEG 10/10 was normal/ neg... ~  MRI/ MRA Brain 10/10 showed NAD, scattered subcortical T2 hyperintensities are sl greater in number than expected for age, MRA= normal.  ~  CT CSpine 10/10 shows DDD & spondylosis lower CSpine... ~  CDopplers 10/10 were WNL... ~  2DEcho 10/10 was normal w/ EF= 60-65% & no wall motion abn...  ANXIETY (ICD-300.00)  SYMPTOM, INSOMNIA NOS (ICD-780.52)   Past Surgical History  Procedure Date  . Bilateral inguinal herniorrhaphy     1960's  . Appendectomy 1967  . Knee surgery x 2 2004,2008  . Bilateral shoulder surgery 2006, 2007,2010  . Ganglion cyst surgery 2009    Outpatient Encounter Prescriptions as of 05/19/2011  Medication Sig Dispense Refill  . chlorproMAZINE (THORAZINE) 10 MG tablet As needed for migraine headache       . latanoprost (XALATAN) 0.005 % ophthalmic solution As needed      .  RELPAX 40 MG tablet One tablet by mouth as needed for migraine headache.  If the headache improves and then returns, dose may be repeated after 2 hours have elapsed since first dose (do not exceed 80 mg per day). As needed      . DISCONTD: pramipexole (MIRAPEX) 0.125 MG tablet As needed      . DISCONTD: predniSONE (STERAPRED UNI-PAK) 5 MG TABS Take as directed  1 each  0  . DISCONTD: traMADol (ULTRAM) 50 MG tablet Take 1 tablet (50 mg total) by mouth 3 (three) times daily as needed for pain. Maximum dose= 3 tablets per day  90 tablet  5   Facility-Administered Encounter Medications as of 05/19/2011  Medication Dose Route Frequency Provider Last Rate Last Dose  . methylPREDNISolone acetate (DEPO-MEDROL) injection 80 mg  80 mg Intramuscular Once Michele Mcalpine, MD   80 mg at 05/19/11 1543    Allergies  Allergen Reactions  . Levofloxacin     REACTION: rash  . Prednisone     pred pak makes the pt very agitated--pt can tolerate the depo medrol injection  . Sulfamethoxazole W/Trimethoprim     REACTION: rash    Current Medications, Allergies, Past Medical History, Past Surgical History, Family History, and Social History were reviewed in [employer] record.    Review of Systems        The patient complains of malaise, nasal congestion, dyspnea on exertion, indigestion/heartburn, paresthesias, and anxiety.  The patient denies fever, chills, sweats, anorexia, fatigue, weakness, weight loss, sleep disorder, blurring, diplopia, eye irritation, eye discharge, vision loss, eye pain, photophobia, earache, ear discharge, tinnitus, decreased hearing, nosebleeds, sore throat, hoarseness, chest pain, palpitations, syncope, orthopnea, PND, peripheral edema, cough, dyspnea at rest, excessive sputum, hemoptysis, wheezing, pleurisy, nausea, vomiting, diarrhea, constipation, change in bowel habits, abdominal pain, melena, hematochezia, jaundice, gas/bloating, dysphagia, odynophagia, dysuria,  hematuria, urinary frequency, urinary hesitancy, nocturia, incontinence, back pain, joint pain, joint swelling, muscle cramps, muscle weakness, stiffness, arthritis, sciatica, restless legs, leg pain at night, leg pain with exertion, rash, itching, dryness, suspicious lesions, paralysis, seizures, tremors, vertigo, transient blindness, frequent falls, frequent headaches, difficulty walking, depression, memory loss, confusion, cold intolerance, heat intolerance, polydipsia, polyphagia, polyuria, unusual weight change, abnormal bruising, bleeding, enlarged lymph nodes, urticaria, allergic rash, hay fever, and recurrent infections.     Objective:   Physical Exam     WD, WN, 50 y/o WM in NAD... GENERAL:  Alert & oriented; pleasant & cooperative... HEENT:  [place]/AT, EOM-wnl, PERRLA, Fundi-benign, EACs-clear, TMs-wnl, NOSE-clear, THROAT-clear & wnl. NECK:  Supple w/ fairROM; no JVD; normal carotid impulses w/o bruits; no thyromegaly or nodules palpated; no lymphadenopathy. CHEST:  Clear to P & A; without wheezes/ rales/ or rhonchi. HEART:  Regular Rhythm; without murmurs/ rubs/ or gallops. ABDOMEN:  Soft & nontender; normal bowel sounds; no organomegaly or masses detected. RECTAL:  Neg - prostate 2+ & nontender w/o nodules; stool hematest neg. EXT: without deformities, mild arthritic changes; no varicose veins/ venous insuffic/ or edema. scars of shoulder surgeries... NEURO:  CN's intact; motor testing normal; sensory testing normal; gait normal & balance OK. DERM:  No lesions noted; no rash etc...  RADIOLOGY DATA:  Reviewed in the EPIC EMR & discussed w/ the patient...  LABORATORY DATA:  Reviewed in the EPIC EMR & discussed w/ the patient...    >LABS showed CBC- normal, Chems- normal, TSH- wnl, CPK- wnl, Sed- normal at 7...   Assessment & Plan:   Severe Migraine HAs>  These have been present x yrs and managed by DrHagen [facility] clinic; she has tried Botox etc; he will need to f/u w/ her ASAP & we will  call to set this up...   Hx AB>  No recent exac & he is not on any regular breathing meds...  Hyperlipid>  On diet alone as he was intol to low dose Simva in past & refused other statins or Lipid Clinic; he's doing the best he can on diet alone...  GI> GERD, Polyps>  Prev on Protonix, he stopped on his own; reminded to use OTC Prilosec as needed for symptoms...  DJD, LBP, ?FM>  He has  seen DrZ in the past,?FM, known lumbar DDD, he has also seen Chiropractors for XRays and adjustments; prev eval CSpine by DrRamos w/ shot...  Anxiety>  Not currently on anxiotytic rx...   Patient's Medications  New Prescriptions   No medications on file  Previous Medications   CHLORPROMAZINE (THORAZINE) 10 MG TABLET    As needed for migraine headache    LATANOPROST (XALATAN) 0.005 % OPHTHALMIC SOLUTION    As needed   RELPAX 40 MG TABLET    One tablet by mouth as needed for migraine headache.  If the headache improves and then returns, dose may be repeated after 2 hours have elapsed since first dose (do not exceed 80 mg per day). As needed  Modified Medications   No medications on file  Discontinued Medications   PRAMIPEXOLE (MIRAPEX) 0.125 MG TABLET    As needed

## 2011-05-19 NOTE — Patient Instructions (Signed)
Today we updated your med list in our EPIC system...    Continue your current medications the same...     Today we gave you a Depo shot & wrote a new prescription for a PredDosepak to help w/ the inflammation...    We also wrote for an additional pain medication: TRAMADOL - take one up to 3 times daily w/ Tylenol to boost it's effect.  Today we did your follow up blood work...    Please call the [facility]REE in a few days for your results...    Dial [id number] & when prompted enter your patient number followed by the # symbol...    Your patient number is:  [id number]#  We will arrange for an ASAP f/u appt w/ your HA doctor...  Call for any questions.Marland KitchenMarland Kitchen

## 2011-05-20 LAB — CK TOTAL AND CKMB (NOT AT ARMC)
CK, MB: 1.6 ng/mL (ref 0.3–4.0)
Total CK: 68 U/L (ref 7–232)

## 2011-06-01 ENCOUNTER — Telehealth: Payer: Self-pay | Admitting: Pulmonary Disease

## 2011-06-01 NOTE — Telephone Encounter (Signed)
Form has been completed by SN and faxed back to human resources per pts request.  Called and spoke with pt and he is aware that form has been faxed back.

## 2011-06-10 ENCOUNTER — Encounter: Payer: Self-pay | Admitting: Pulmonary Disease

## 2011-06-18 ENCOUNTER — Telehealth: Payer: Self-pay | Admitting: Pulmonary Disease

## 2011-06-18 NOTE — Telephone Encounter (Signed)
Called and spoke with pt and he stated that [employer] needs the FMLA to read dec 3 instead of  December 5 and he stated that HR told him to call and see if we could change this date for him so that this time away from work will be covered under the [employer].  Printed out form and will have SN change and initial this change.

## 2011-06-18 NOTE — Telephone Encounter (Signed)
Form has been completed by SN again and faxed back to [employer] per pts request.   Will send down to scan corrected form in pts chart.

## 2011-08-06 ENCOUNTER — Encounter: Payer: BC Managed Care – PPO | Admitting: Pulmonary Disease

## 2011-08-13 ENCOUNTER — Other Ambulatory Visit (INDEPENDENT_AMBULATORY_CARE_PROVIDER_SITE_OTHER): Payer: BC Managed Care – PPO

## 2011-08-13 ENCOUNTER — Other Ambulatory Visit: Payer: Self-pay | Admitting: Pulmonary Disease

## 2011-08-13 ENCOUNTER — Encounter: Payer: Self-pay | Admitting: Pulmonary Disease

## 2011-08-13 ENCOUNTER — Ambulatory Visit (INDEPENDENT_AMBULATORY_CARE_PROVIDER_SITE_OTHER)
Admission: RE | Admit: 2011-08-13 | Discharge: 2011-08-13 | Disposition: A | Discharge status: Home / Self Care | Payer: BC Managed Care – PPO | Source: Ambulatory Visit | Attending: Pulmonary Disease | Admitting: Pulmonary Disease

## 2011-08-13 ENCOUNTER — Ambulatory Visit (INDEPENDENT_AMBULATORY_CARE_PROVIDER_SITE_OTHER): Payer: BC Managed Care – PPO | Admitting: Pulmonary Disease

## 2011-08-13 VITALS — BP 102/70 | HR 97 | Temp 99.0°F | Ht 69.0 in | Wt 149.4 lb

## 2011-08-13 DIAGNOSIS — K219 Gastro-esophageal reflux disease without esophagitis: Secondary | ICD-10-CM

## 2011-08-13 DIAGNOSIS — K4090 Unilateral inguinal hernia, without obstruction or gangrene, not specified as recurrent: Secondary | ICD-10-CM | POA: Insufficient documentation

## 2011-08-13 DIAGNOSIS — E785 Hyperlipidemia, unspecified: Secondary | ICD-10-CM

## 2011-08-13 DIAGNOSIS — M1990 Unspecified osteoarthritis, unspecified site: Secondary | ICD-10-CM

## 2011-08-13 DIAGNOSIS — R300 Dysuria: Secondary | ICD-10-CM

## 2011-08-13 DIAGNOSIS — R51 Headache: Secondary | ICD-10-CM

## 2011-08-13 DIAGNOSIS — Z0000 Encounter for general adult medical examination without abnormal findings: Secondary | ICD-10-CM

## 2011-08-13 DIAGNOSIS — N139 Obstructive and reflux uropathy, unspecified: Secondary | ICD-10-CM

## 2011-08-13 DIAGNOSIS — IMO0001 Reserved for inherently not codable concepts without codable children: Secondary | ICD-10-CM

## 2011-08-13 DIAGNOSIS — D126 Benign neoplasm of colon, unspecified: Secondary | ICD-10-CM

## 2011-08-13 DIAGNOSIS — G4700 Insomnia, unspecified: Secondary | ICD-10-CM

## 2011-08-13 LAB — URINALYSIS
Bilirubin Urine: NEGATIVE
Hgb urine dipstick: NEGATIVE
Ketones, ur: NEGATIVE
Leukocytes, UA: NEGATIVE
Nitrite: NEGATIVE
Specific Gravity, Urine: <=1.005 (ref 1.000–1.030)
Total Protein, Urine: NEGATIVE
Urine Glucose: NEGATIVE
Urobilinogen, UA: 0.2 (ref 0.0–1.0)
pH: 6 (ref 5.0–8.0)

## 2011-08-13 LAB — LIPID PANEL
Cholesterol: 208 mg/dL — ABNORMAL HIGH (ref 0–200)
HDL: 57.3 mg/dL (ref >39.00)
Total CHOL/HDL Ratio: 4
Triglycerides: 94 mg/dL (ref 0.0–149.0)
VLDL: 18.8 mg/dL (ref 0.0–40.0)

## 2011-08-13 LAB — TESTOSTERONE: Testosterone: 625.38 ng/dL (ref 350.00–890.00)

## 2011-08-13 LAB — LDL CHOLESTEROL, DIRECT: Direct LDL: 127.7 mg/dL

## 2011-08-13 LAB — PSA: PSA: 1.18 ng/mL (ref 0.10–4.00)

## 2011-08-13 NOTE — Progress Notes (Addendum)
Subjective:    Patient ID: Randy Moran, male    DOB: [birth year], 51 y.o.   MRN: [id number]  HPI 51 y/o WM here for a follow up visit and CPX...  he has multiple medical problems as noted below...    ~  April 18, 2009:  he was hosp 10/10 for "syncope vs seizure" w/ neg eval & episode believed to be due to meds for migraine headaches (DrHagen at the [facility]) + Vicodin he received from DrNorris after right rotator cuff surg 8/10... no other issues this past yr... he's already had his 2010 flu shot & we will give him a Tdap booster today.  ~  July 17, 2010:  45mo ROV & [name] had a recent sinus infection- treated by TP w/ Augmentin, Mucinex, NettiPot etc- but symptoms are recurrent & we discussed the need for anti-inflamm Rx (Depo, Pred, etc)...      He underwent right shoulder surg x2 by DrNorris in 2010 & 2011 for rotator cuff problem & he still has some pain in this area.    He still sees DrHagen at the [facility] but she's simplified his regimen- just using Relpax Prn...  ~  May 19, 2011:  62mo ROV & urgent add-on appt for severe headache> frontal, temporal & occipital too; continuous 8-9/10 severity; mult assoc symptoms> right ear pops, N w/o V, body aches but no f/c/s, legs hurt & hard to walk;  Relpax Rx per DrHagen w/o relief;  He saw DrHagen 9/12 & her note is reviewed> she was doing a trial of Botox for his chr HAs, plus his Relpax40mg  prn, & Mirapex for RLS; f/u planned 40mo...    We discussed Rx w/ Depo80, Pred Dosepak, Tamadol & Tylenol, w/ f/u ASAP with his Neurologist/ [facility] specialist DrHagen (now in [place]);  He reminds me that he cannot tolerate even the low dose Pred dosepak due to agitation, & is intol to the Tramadol as well;  He was intol to Vicodin prev w/ confusion req admission in 2010;  Therefore he will need to continue meds per DrHagen & follow up w/ her ASAP (we will call to set this up)==> seen & given trigger point injection + Diclofenac; pain resolved.    EPIC  review shows XRays of Neck (DDD at C6-7), Back (neg- normal alignment & disc spaces), & pelvis (WNL) 7/12 by [id number]- his Chiropractor; no mention from the pt about chiropractic therapy or any signif HA relief from their adjustments... He has also seen DrRamos in the past w/ cervical disc dis & prev had injection...  ~  August 13, 2011:  CPX & 40mo ROV> his CC is Insomnia & tired all the time w/ decr energy etc;  He denies snoring but has lots of movement according to wife (prev on Mirapex per DrHagan) & no reported apneas==> ESS is 14/24 & we discussed need for sleep study (to be scheduled at his convenience);  He also notes a bulge in right groin & exam confirms recurrent RIH==> refer to [facility] for elective repair;  See prob list below >> CXR 3/13 showed normal heart size, clear lungs, NAD.Marland KitchenMarland Kitchen EKG 3/13 showed NSR, rate82, WNL, NAD... LABS 12/12:  Chems- wnl;  CBC- wnl;  TSH=1.21 LABS 3/13:  FLP- ok x TChol/LDL not at goals;  PSA=1.18;  Testos=625;  UA- clear  ADDENDUM 10/13 >> he finally had sleep study >> AHI=3, worse supine, mod snoring, some limb movements w/ sleep disruption; aske to get off his back at  night, & he endorses PLMS w/ disturbance to wife etc; therefore trial MIRAPEX 0.5mg => incr to 1mg  as needed...           Problem List:     PROBLEM LIST UPDATED 08/13/11 >>  GLAUCOMA >> on Xalatan eye drops from DrCashwell w/ good control according to the pt...  Hx of ASTHMATIC BRONCHITIS, ACUTE (ICD-466.0) - AB exac in 2009 w/ Biaxin, Prednisone, Tussionex... finally resolved and doesn't require regular inhalers so far... he also had a sinusitis & dizzy eval by [facility] w/ CT Sinuses showing clear x sm amt of fluid in sphenoid sinus- he states all symptoms resolved w/ the Pred Rx... ~  3/13: He denies breathing difficulties, cough, phlegm, CP, palpit, SOB, edema, etc...  HYPERLIPIDEMIA (ICD-272.4) - prev on Simvastatin 20mg /d, but pt stopped due to "feeling bad" & now on diet alone... ~  FLP  4/07 showed TChol 238, TG 64, HDL 56, LDL 171... rec> try Simva20... pt dc'd on his own. ~  FLP 8/08 showed TChol 230, TG 65, HDL 52, LDL 176... rec> vigorous diet efforts, refuses med. ~  FLP 11/08 showed TChol 173, TG 61, HDL 55, LDL 106... fantastic job w/ diet, add exercise. ~  FLP 10/09 showed TChol 176, TG 50, HDL 60, LDL 106 ~  FLP 11/10 showed TChol 238, TG 52, HDL 61, LDL 170... what happened? he still prefers diet Rx. ~  FLP 2/12 showed TChol 221, TG 49, HDL 64, LDL 141... discussed diet + exercise, he refuses meds. ~  FLP 3/13 on diet alone showed TChol 208, TG 94, HDL 57, LDL 128... Sl improved, continue diet efforts.  GERD (ICD-530.81) - on PRILOSEC 20mg /d Prn... followed by Dorris Singh for GI... he was in a GI drug study of reflux & the study ended... last EGD was 9/08 showing esophagitis... he uses PPI meds just as needed he says...  COLONIC POLYPS (ICD-211.3) - colonoscopy 4/09 by Dorris Singh showed divertics & 3mm polyp= hyperplastic... f/u planned ?10 yrs (there is a + FamHx of colon cancer in his mother).  INGUINAL HERNIA >> 3/13 he noted lump in right groin c/e RIH x several months;  He has hx of Bilat Inguinal Hernias repaired in the 1960s==> we will refer to [facility] for surgical consideration.  ? of PROSTATITIS NOS (ICD-601.9) - treated by [facility] in 1988 for prostatitis... pt had f/u w/ Urology in 2010 and everything was fine.  DEGENERATIVE JOINT DISEASE (ICD-715.90) - prev evals by Drs Bary Leriche and [place] w/ surgery to both shoulders, knee x2, and wrist... he has also seen a chiropractor- DrWillen, w/ CSpine problems; & now [employer]... ~  3/13: He tells me that DrNorris is coordinating his Ortho care & worst prob is his shoulders w/ 4 ops- 3 right & 1 left, and still he has difficulty; using Advil/ Tylenol/ PT...  NECK PAIN LOW BACK PAIN (ICD-724.2) MYOFASCIAL PAIN SYNDROME (ICD-729.1) - he has seen DrZeminski in the past for ? fibromyalgia and lumbar  DDD... ~  7/12: He saw Chiropractor Evlyn Kanner for neck/ back pain & XRays in EPIC revealed> CSpine- straightened alignment with degenerative disc disease at C6-7; L/SSpine- norm alignment, norm disc spaces; Pelvis- normal,,,  HEADACHE (ICD-784.0) - [facility] headache [facility]> felt to be migraine headaches and treated w/ mult meds over the yrs includingTopamax, Baclofen, RELPAX, Tylenol & Ibuprofen/ Diclofenac; he reminds me that he is INTOLERANT to Vicodin, Tramadol, Prednisone...  ~  Note: also saw DrStiefel in  1983 for severe incapacitating headaches... ~  Hosp 10/10 w/ altered mental status prob related to meds + Vicodin given for shoulder surg> resolved off meds & f/u w/ DrHagen to re-assess his migraines> just using Prn Relax now. 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 ~  EEG 10/10 was normal/ neg... ~  MRI/ MRA Brain 10/10 showed NAD, scattered subcortical T2 hyperintensities are sl greater in number than expected for age, MRA= normal.  ~  CT CSpine 10/10 shows DDD & spondylosis lower CSpine... ~  CDopplers 10/10 were WNL... ~  2DEcho 10/10 was normal w/ EF= 60-65% & no wall motion abn...  ANXIETY & INSOMNIA >>  PHYSICAL EXAMINATION (ICD-V70.0) - he takes ASA 81mg /d... ~  Colon: mother had colon cancer- colonoscopy 4/09 by Dorris Singh w/ 3mm hyperpl polyp... ~  Immunizations:  he gets the yearly Flu vaccines;  and had TDAP 11/10...   Past Surgical History  Procedure Date  . Bilateral inguinal herniorrhaphy     1960's  . Appendectomy 1967  . Knee surgery x 2 2004,2008  . Bilateral shoulder surgery 2006, 2007,2010  . Ganglion cyst surgery 2009    Outpatient Encounter  Prescriptions as of 08/13/2011  Medication Sig Dispense Refill  . chlorproMAZINE (THORAZINE) 10 MG tablet As needed for migraine headache       . latanoprost (XALATAN) 0.005 % ophthalmic solution As needed      . RELPAX 40 MG tablet One tablet by mouth as needed for migraine headache.  If the headache improves and then returns, dose may be repeated after 2 hours have elapsed since first dose (do not exceed 80 mg per day). As needed      . HYDROcodone-ibuprofen (VICOPROFEN) 7.5-200 MG per tablet as needed.        Allergies  Allergen Reactions  . Levofloxacin     REACTION: rash  . Prednisone     pred pak makes the pt very agitated--pt can tolerate the depo medrol injection  . Sulfamethoxazole W/Trimethoprim     REACTION: rash    Current Medications, Allergies, Past Medical History, Past Surgical History, Family History, and Social History were reviewed in [employer] record.    Review of Systems        The patient complains of malaise, nasal congestion, dyspnea on exertion, indigestion/heartburn, paresthesias, and anxiety.  The patient denies fever, chills, sweats, anorexia, fatigue, weakness, weight loss, sleep disorder, blurring, diplopia, eye irritation, eye discharge, vision loss, eye pain, photophobia, earache, ear discharge, tinnitus, decreased hearing, nosebleeds, sore throat, hoarseness, chest pain, palpitations, syncope, orthopnea, PND, peripheral edema, cough, dyspnea at rest, excessive sputum, hemoptysis, wheezing, pleurisy, nausea, vomiting, diarrhea, constipation, change in bowel habits, abdominal pain, melena, hematochezia, jaundice, gas/bloating, dysphagia, odynophagia, dysuria, hematuria, urinary frequency, urinary hesitancy, nocturia, incontinence, back pain, joint pain, joint swelling, muscle cramps, muscle weakness, stiffness, arthritis, sciatica, restless legs, leg pain at night, leg pain with exertion, rash, itching, dryness, suspicious lesions,  paralysis, seizures, tremors, vertigo, transient blindness, frequent falls, frequent headaches, difficulty walking, depression, memory loss, confusion, cold intolerance, heat intolerance, polydipsia, polyphagia, polyuria, unusual weight change, abnormal bruising, bleeding, enlarged lymph nodes, urticaria, allergic rash, hay fever, and recurrent infections.  Objective:   Physical Exam     WD, WN, 51 y/o WM in NAD... GENERAL:  Alert & oriented; pleasant & cooperative... HEENT:  [place]/AT, EOM-wnl, PERRLA, Fundi-benign, EACs-clear, TMs-wnl, NOSE-clear, THROAT-clear & wnl. NECK:  Supple w/ fairROM; no JVD; normal carotid impulses w/o bruits; no thyromegaly or nodules palpated; no lymphadenopathy. CHEST:  Clear to P & A; without wheezes/ rales/ or rhonchi. HEART:  Regular Rhythm; without murmurs/ rubs/ or gallops. ABDOMEN:  Soft & nontender; normal bowel sounds; no organomegaly or masses detected.    Recurrent right inguinal hernia  RECTAL:  Neg - prostate 2+ & nontender w/o nodules; stool hematest neg. EXT: without deformities, mild arthritic changes; no varicose veins/ venous insuffic/ or edema. scars of shoulder surgeries & some decr ROM... NEURO:  CN's intact; motor testing normal; sensory testing normal; gait normal & balance OK. DERM:  No lesions noted; no rash etc...  RADIOLOGY DATA:  Reviewed in the EPIC EMR & discussed w/ the patient...  LABORATORY DATA:  Reviewed in the EPIC EMR & discussed w/ the patient...   Assessment & Plan:   CPX>>  INSOMNIA, Fatigue, no energy==> Labs normal, Testos normal, needs Sleep Study ==> pending  Hx AB>  No recent exac & he is not on any regular breathing meds...  Hyperlipid>  On diet alone as he was intol to low dose Simva in past & refused other statins or Lipid Clinic; he's doing the best he can on diet alone...  GI> GERD, Polyps>  Prev on Protonix, he stopped on his own; reminded to use OTC Prilosec as needed for symptoms...  DJD, LBP, ?FM>   He has seen DrZ in the past,?FM, known lumbar DDD, he has also seen Chiropractors for XRays and adjustments; prev eval CSpine by DrRamos w/ shot...  Severe Migraine HAs>  These have been present x yrs and managed by DrHagen [facility] clinic; she has tried Botox etc; he will need to maintain f/u w/ her regularly...  Anxiety>  Not currently on anxiotytic rx...   Patient's Medications  New Prescriptions   No medications on file  Previous Medications   CHLORPROMAZINE (THORAZINE) 10 MG TABLET    As needed for migraine headache    HYDROCODONE-IBUPROFEN (VICOPROFEN) 7.5-200 MG PER TABLET    as needed.   LATANOPROST (XALATAN) 0.005 % OPHTHALMIC SOLUTION    As needed   RELPAX 40 MG TABLET    One tablet by mouth as needed for migraine headache.  If the headache improves and then returns, dose may be repeated after 2 hours have elapsed since first dose (do not exceed 80 mg per day). As needed  Modified Medications   No medications on file  Discontinued Medications   No medications on file

## 2011-08-13 NOTE — Patient Instructions (Signed)
Today we updated your med list in our EPIC system...    Continue your current medications the same...  Today we did your follow up CXR, EKG, & fasting blood work...    Please call the PHONE TREE in a few days for your results...    Dial [id number] & when prompted enter your patient number followed by the # symbol...    Your patient number is:  [id number]#  We will arrange for a Sleep Study to be done at your convenience at [facility]...    We will call you w/ the results when available...  We will also sched a consultation at [facility] surgery regarding your Right Inguinal Hernia...  Call for any problems.Marland KitchenMarland Kitchen

## 2011-08-26 ENCOUNTER — Ambulatory Visit (INDEPENDENT_AMBULATORY_CARE_PROVIDER_SITE_OTHER): Payer: Self-pay | Admitting: General Surgery

## 2011-08-30 ENCOUNTER — Other Ambulatory Visit (INDEPENDENT_AMBULATORY_CARE_PROVIDER_SITE_OTHER): Payer: Self-pay | Admitting: General Surgery

## 2011-08-30 ENCOUNTER — Encounter (INDEPENDENT_AMBULATORY_CARE_PROVIDER_SITE_OTHER): Payer: Self-pay | Admitting: General Surgery

## 2011-08-30 ENCOUNTER — Ambulatory Visit (INDEPENDENT_AMBULATORY_CARE_PROVIDER_SITE_OTHER): Payer: BC Managed Care – PPO | Admitting: General Surgery

## 2011-08-30 VITALS — BP 126/84 | HR 60 | Temp 98.0°F | Resp 16 | Ht 69.0 in | Wt 147.5 lb

## 2011-08-30 DIAGNOSIS — K4021 Bilateral inguinal hernia, without obstruction or gangrene, recurrent: Secondary | ICD-10-CM | POA: Insufficient documentation

## 2011-08-30 NOTE — Progress Notes (Signed)
Patient ID: Randy Moran, male   DOB: 1961-01-16, 51 y.o.   MRN: [id number]  Chief Complaint  Patient presents with  . New Evaluation    eval of RIH     HPI Randy Moran is a 51 y.o. male.   HPI  He is referred by Dr. Kriste Basque for evaluation of a right inguinal hernia. He had bilateral inguinal hernia repairs in the past. The right-sided hernia had his appendix in it and this was removed at that time.  He has developed right inguinal swelling and discomfort intermittently. He also has a left inguinal discomfort. He was noted to have an obvious right inguinal hernia on his yearly physical. He denies difficulty with urination or constipation. He denies a chronic cough. He is here for further evaluation and treatment.  Past Medical History  Diagnosis Date  . Allergy history unknown   . Asthmatic bronchitis   . Hyperlipidemia   . GERD (gastroesophageal reflux disease)   . Hx of colonic polyps   . Prostatitis   . DJD (degenerative joint disease)   . Lower back pain   . Myofascial pain syndrome   . Headache   . Altered mental status   . Anxiety   . Insomnia   . Glaucoma left eye    Past Surgical History  Procedure Date  . Bilateral inguinal herniorrhaphy     1960's  . Appendectomy 1967  . Knee surgery x 2 2004,2008  . Bilateral shoulder surgery 2006, 2007,2010  . Ganglion cyst surgery 2009    Family History  Problem Relation Age of Onset  . Colon cancer    . Coronary artery disease    . Hyperlipidemia    . Hypertension      Social History History  Substance Use Topics  . Smoking status: Never Smoker   . Smokeless tobacco: Never Used  . Alcohol Use: No    Allergies  Allergen Reactions  . Levofloxacin     REACTION: rash  . Prednisone     pred pak makes the pt very agitated--pt can tolerate the depo medrol injection  . Sulfamethoxazole W/Trimethoprim     REACTION: rash    Current Outpatient Prescriptions  Medication Sig Dispense Refill  . acetaminophen  (TYLENOL) 500 MG tablet Take 500 mg by mouth every 6 (six) hours as needed.      . Ibuprofen (ADVIL) 200 MG CAPS Take by mouth as needed.      . latanoprost (XALATAN) 0.005 % ophthalmic solution As needed        Review of Systems Review of Systems  Constitutional: Negative.   Respiratory: Negative.   Cardiovascular: Negative.   Gastrointestinal: Negative.   Genitourinary: Negative for difficulty urinating.       Bilateral intermittent groin pain.  Musculoskeletal: Negative.   Neurological: Positive for headaches.  Hematological: Does not bruise/bleed easily.    Blood pressure 126/84, pulse 60, temperature 98 F (36.7 C), temperature source Temporal, resp. rate 16, height 5\' 9"  (1.753 m), weight 147 lb 8 oz (66.906 kg).  Physical Exam Physical Exam  Constitutional: He appears well-developed and well-nourished. No distress.  HENT:  Head: Normocephalic and atraumatic.  Eyes: Conjunctivae are normal. No scleral icterus.  Cardiovascular: Normal rate and regular rhythm.   No murmur heard. Pulmonary/Chest: Effort normal and breath sounds normal.  Abdominal: Soft. He exhibits no distension and no mass. There is no tenderness.  Genitourinary:       Bilateral groin scars. There is a right inguinal  bulge that is reducible. There is a small left inguinal bulge felt with a cough.  Musculoskeletal: He exhibits no edema.    Data Reviewed Dr. Jodelle Green note.  Assessment    Recurrent symptomatic bilateral inguinal hernias.    Plan    Laparoscopic repair of bilateral inguinal hernias with mesh.  I have explained the procedure, risks, and aftercare of inguinal hernia repair.  Risks include but are not limited to bleeding, infection, wound problems, anesthesia, recurrence, bladder or intestine injury, urinary retention, testicular dysfunction, chronic pain, mesh problems.  He seems to understand and agrees to proceed.       Hayato Guaman J 08/30/2011, 3:27 PM

## 2011-08-30 NOTE — Patient Instructions (Signed)
Avoid activities that cause you pain.

## 2011-09-08 ENCOUNTER — Ambulatory Visit (INDEPENDENT_AMBULATORY_CARE_PROVIDER_SITE_OTHER): Payer: BC Managed Care – PPO | Admitting: General Surgery

## 2011-09-17 ENCOUNTER — Ambulatory Visit (HOSPITAL_BASED_OUTPATIENT_CLINIC_OR_DEPARTMENT_OTHER): Payer: BC Managed Care – PPO

## 2011-09-27 ENCOUNTER — Other Ambulatory Visit: Payer: Self-pay | Admitting: Pulmonary Disease

## 2011-09-27 DIAGNOSIS — G4700 Insomnia, unspecified: Secondary | ICD-10-CM

## 2011-09-30 DIAGNOSIS — K4021 Bilateral inguinal hernia, without obstruction or gangrene, recurrent: Secondary | ICD-10-CM

## 2011-09-30 HISTORY — PX: HERNIA REPAIR: SHX51

## 2011-10-01 ENCOUNTER — Telehealth (INDEPENDENT_AMBULATORY_CARE_PROVIDER_SITE_OTHER): Payer: Self-pay | Admitting: General Surgery

## 2011-10-01 NOTE — Telephone Encounter (Signed)
Wife calling to report pt with nausea and low-grade fever of 100 F today.  He has not vomited or complained of pain, taking the pain meds only infrequently, but taking plain Tylenol for the fever.  He is eating a little.  Reassured wife and advised to push po fluids AMAP, especially Gatorade or sports drinks.  Can call answering service over weekend for advice if symptoms worsen.

## 2011-10-07 ENCOUNTER — Telehealth (INDEPENDENT_AMBULATORY_CARE_PROVIDER_SITE_OTHER): Payer: Self-pay | Admitting: General Surgery

## 2011-10-07 NOTE — Telephone Encounter (Signed)
Wife calling about increasing pt's activity and controlling pain after bilateral hernia surgery.  Advised try Aleve or Ibuprofen about 30-60 minutes before planned activity to help with stiff or sore muscles.  Limit walking distances or hills for now.  Continue to avoid pushing, pulling, lifting or carrying > 20 lbs until cleared by physician.  Wife understands.

## 2011-10-13 ENCOUNTER — Encounter (INDEPENDENT_AMBULATORY_CARE_PROVIDER_SITE_OTHER): Payer: Self-pay | Admitting: General Surgery

## 2011-10-13 ENCOUNTER — Ambulatory Visit (INDEPENDENT_AMBULATORY_CARE_PROVIDER_SITE_OTHER): Payer: BC Managed Care – PPO | Admitting: General Surgery

## 2011-10-13 VITALS — BP 108/66 | HR 72 | Temp 97.0°F | Resp 12 | Ht 69.0 in | Wt 150.4 lb

## 2011-10-13 DIAGNOSIS — Z9889 Other specified postprocedural states: Secondary | ICD-10-CM

## 2011-10-13 NOTE — Patient Instructions (Signed)
No lifting.  Take Ibuprofen as directed.  Use ice as needed.

## 2011-10-13 NOTE — Progress Notes (Signed)
Operation: Laparoscopic bilateral repair of recurrent inguinal hernias with mesh  Date: September 30, 2011  Pathology:na  HPI: He is here for his first postoperative visit. He got very active over the weekend and developed severe sharp pain and swelling in the left inguinal area. The pain has improved and the swelling has resolved. In speaking with him, he was more active than was recommended.   Physical Exam: Abdomen-incisions are clean and intact.  GU-both inguinal floors or solid. There is no significant swelling. No recurrent hernia.  Assessment: Left groin pain from overactivity following laparoscopic repair of bilateral recurrent inguinal hernias  Plan: Nonsteroidals and ice. Decreased activities as previously directed. Return visit 3 weeks.

## 2011-11-05 ENCOUNTER — Encounter (HOSPITAL_BASED_OUTPATIENT_CLINIC_OR_DEPARTMENT_OTHER): Payer: BC Managed Care – PPO

## 2011-11-10 ENCOUNTER — Ambulatory Visit (INDEPENDENT_AMBULATORY_CARE_PROVIDER_SITE_OTHER): Payer: BC Managed Care – PPO | Admitting: General Surgery

## 2011-11-10 ENCOUNTER — Encounter (INDEPENDENT_AMBULATORY_CARE_PROVIDER_SITE_OTHER): Payer: Self-pay | Admitting: General Surgery

## 2011-11-10 VITALS — BP 114/68 | HR 64 | Temp 97.3°F | Resp 12 | Ht 69.0 in | Wt 151.1 lb

## 2011-11-10 DIAGNOSIS — Z9889 Other specified postprocedural states: Secondary | ICD-10-CM

## 2011-11-10 NOTE — Patient Instructions (Signed)
Light activities for 2 more weeks. Then, resume usual activities as tolerated as we discussed.

## 2011-11-10 NOTE — Progress Notes (Signed)
Operation: Laparoscopic bilateral repair of recurrent inguinal hernias with mesh  Date: September 30, 2011  Pathology:na  HPI: He is here for his second postoperative visit.  He is much better overall.  He has had some mild bilateral groin pain which is nearly resolved.  Physical Exam: Abdomen-incisions are clean and intact.  GU-both inguinal floors or solid. There is no significant swelling. No recurrent hernia.  Assessment:  He has improved overall.  Plan:  Continue light activities for 2 more weeks then resume normal activities as tolerated-we discussed what this meant.  Potential for intermittent chronic pains was discussed with him.  RTC prn.

## 2011-11-17 ENCOUNTER — Encounter (INDEPENDENT_AMBULATORY_CARE_PROVIDER_SITE_OTHER): Payer: Self-pay

## 2011-12-01 DIAGNOSIS — G2581 Restless legs syndrome: Secondary | ICD-10-CM | POA: Insufficient documentation

## 2011-12-01 DIAGNOSIS — G43709 Chronic migraine without aura, not intractable, without status migrainosus: Secondary | ICD-10-CM | POA: Insufficient documentation

## 2011-12-24 ENCOUNTER — Telehealth (INDEPENDENT_AMBULATORY_CARE_PROVIDER_SITE_OTHER): Payer: Self-pay

## 2011-12-24 NOTE — Telephone Encounter (Signed)
LMOM for pt to call. From msg I received I could not determine what type of form he is calling about. Left msg or pt to call back. I Cyndra Numbers available this should be routed to her.

## 2011-12-24 NOTE — Telephone Encounter (Signed)
Pt has a work form from [employer] power he needs to have done before he can rtw. Pt will bring it to front desk and pay fee. Pt advised to inform front desk form needs to be processed asap. Pt aware will be next week before from can be completed.

## 2012-02-09 ENCOUNTER — Telehealth: Payer: Self-pay | Admitting: Pulmonary Disease

## 2012-02-09 NOTE — Telephone Encounter (Signed)
Spoke with pt. He states needs to reschedule PSG again. I gave him number to call the sleep ctr so he can reschedule this. Pt states nothing further needed.

## 2012-03-24 ENCOUNTER — Ambulatory Visit (HOSPITAL_BASED_OUTPATIENT_CLINIC_OR_DEPARTMENT_OTHER): Payer: BC Managed Care – PPO | Attending: Pulmonary Disease | Admitting: Sleep Medicine

## 2012-03-24 VITALS — Ht 70.0 in | Wt 150.0 lb

## 2012-03-24 DIAGNOSIS — G4700 Insomnia, unspecified: Secondary | ICD-10-CM

## 2012-03-24 DIAGNOSIS — G4710 Hypersomnia, unspecified: Secondary | ICD-10-CM | POA: Insufficient documentation

## 2012-04-02 DIAGNOSIS — G4730 Sleep apnea, unspecified: Secondary | ICD-10-CM

## 2012-04-02 DIAGNOSIS — G4710 Hypersomnia, unspecified: Secondary | ICD-10-CM

## 2012-04-03 NOTE — Procedures (Signed)
[name], Randy Moran             ACCOUNT NO.:  [id number]  MEDICAL RECORD NO.:  [id number]          PATIENT TYPE:  OUT  LOCATION:  SLEEP CENTER                 [facility]ACILITY[facility]  [facility]  PHYSICIAN:  Barbaraann Share, MD,FCCPDATE OF BIRTH:  01/20/1961  DATE OF STUDY:  03/24/2012                           NOCTURNAL POLYSOMNOGRAM  REFERRING PHYSICIAN:  Lonzo Cloud. Kriste Basque, MD  REFERRING PHYSICIAN:  Lonzo Cloud. Kriste Basque, MD  INDICATION FOR STUDY:  Hypersomnia with sleep apnea.  EPWORTH SLEEPINESS SCORE:  10.  SLEEP ARCHITECTURE:  The patient had total sleep time of 299 minutes with small quantity of slow wave sleep and only 74 minutes of REM. Sleep onset latency was normal at 21 minutes and REM onset was normal at 87 minutes on a BiPAP.  Sleep efficiency was moderately reduced at 82%.  RESPIRATORY DATA:  The patient was found to have 1 central apnea and 12 obstructive hypopneas, for an AHI of only 3 events per hour.  The events were increased in the supine position and also during REM while in the supine position.  Moderate snoring was noted throughout.  OXYGEN DATA:  There was transient O2 desaturation as low as 82% with the patient's obstructive event.  CARDIAC DATA:  Rare PVC noted, but no clinically significant arrhythmias were seen.  MOVEMENT/PARASOMNIA:  The patient was found to have small numbers of periodic limb movements with only mild sleep disruption.  There were no abnormal behaviors seen.  IMPRESSION/RECOMMENDATION: 1. Small numbers of obstructive events, which do not meet the AHI     criteria for the obstructive sleep apnea syndrome.  The patient did     have moderate snoring and should be encouraged to try and avoid     supine sleep if possible. 2. Rare premature ventricular contraction noted, but no clinically     significant arrhythmias were seen. 3. Small numbers of periodic limb movements with only mild sleep     disruption.  However, would consider whether he has a primary     movement disorder of sleep given his symptoms.  Clinical     correlation is suggested.     Barbaraann Share, MD,FCCP Diplomate, American Board of Sleep Medicine    [facility]/MEDQ  D:  04/02/2012 15:41:18  T:  04/03/2012 05:11:27  Job:  [id number]

## 2012-04-10 MED ORDER — PRAMIPEXOLE DIHYDROCHLORIDE 0.5 MG PO TABS: ORAL_TABLET | ORAL | Status: DC

## 2012-08-02 ENCOUNTER — Telehealth: Payer: Self-pay | Admitting: Pulmonary Disease

## 2012-08-02 ENCOUNTER — Encounter: Payer: Self-pay | Admitting: *Deleted

## 2012-08-02 NOTE — Telephone Encounter (Signed)
Pt aware of SN recs. He is also needing a letter for him to be out of work. Please advise Leigh thanks

## 2012-08-02 NOTE — Telephone Encounter (Signed)
Leigh, we need to know when he can return to work before we do a letter Please advise thanks!

## 2012-08-02 NOTE — Telephone Encounter (Signed)
We can do the letter for the pt to be out of work.  thanks

## 2012-08-02 NOTE — Telephone Encounter (Signed)
Per SN----  Does not need to be out----rest at home Clear liquid diet, soup Tylenol as needed  zofran for the nausea.

## 2012-08-02 NOTE — Telephone Encounter (Signed)
Per SN---pt ok to return to work on Monday 08/07/2012.  thanks

## 2012-08-02 NOTE — Telephone Encounter (Signed)
I spoke with pt. He c/o fever and chills/vomiting on Sunday. Since Monday he has had nausea, lightheadedness, bodyaches, dry cough, congestion, chest heaviness, pain/pressure back of neck, headache comes and goes. He has been taking aleve/tyelnol and zofran for the nausea. Pt is requesting further recs. Please advise SN thanks Last OV 08/13/11 Pending 08/25/12 Allergies  Allergen Reactions  . Levofloxacin     REACTION: rash  . Prednisone     pred pak makes the pt very agitated--pt can tolerate the depo medrol injection  . Sulfamethoxazole W-Trimethoprim     REACTION: rash

## 2012-08-02 NOTE — Telephone Encounter (Signed)
Pt is aware. Letter has been placed upfront for pick up. Nothing further was needed

## 2012-08-03 ENCOUNTER — Telehealth: Payer: Self-pay | Admitting: Pulmonary Disease

## 2012-08-03 MED ORDER — OSELTAMIVIR PHOSPHATE 75 MG PO CAPS: 75.0000 mg | ORAL_CAPSULE | Freq: Two times a day (BID) | ORAL | Status: DC

## 2012-08-03 NOTE — Telephone Encounter (Signed)
I spoke with pt and is aware of SN recs. Rx has been sent. Nothing was needed

## 2012-08-03 NOTE — Telephone Encounter (Signed)
Per SN---ok to send in tamiflu 75 mg   #10  1 po bid until gone.  thanks

## 2012-08-03 NOTE — Telephone Encounter (Signed)
I spoke with pt and requesting tamiflu RX. States his symptoms are getting worse. C/o vomiting, diarrhea, body aches, HA, fever 99.5-100.0, chills, sweats, dry cough, PND, nasal congestion, chest heaviness x Monday. Please advise SN thanks Last OV 08/13/11 Pending 08/12/12 Allergies  Allergen Reactions  . Levofloxacin     REACTION: rash  . Prednisone     pred pak makes the pt very agitated--pt can tolerate the depo medrol injection  . Sulfamethoxazole W-Trimethoprim     REACTION: rash

## 2012-08-24 ENCOUNTER — Other Ambulatory Visit (INDEPENDENT_AMBULATORY_CARE_PROVIDER_SITE_OTHER): Payer: BC Managed Care – PPO

## 2012-08-24 ENCOUNTER — Other Ambulatory Visit: Payer: Self-pay | Admitting: Pulmonary Disease

## 2012-08-24 DIAGNOSIS — Z0000 Encounter for general adult medical examination without abnormal findings: Secondary | ICD-10-CM

## 2012-08-24 LAB — CBC WITH DIFFERENTIAL/PLATELET
Basophils Absolute: 0 10*3/uL (ref 0.0–0.1)
Basophils Relative: 0.4 % (ref 0.0–3.0)
Eosinophils Absolute: 0.2 10*3/uL (ref 0.0–0.7)
Eosinophils Relative: 4.8 % (ref 0.0–5.0)
HCT: 45.1 % (ref 39.0–52.0)
Hemoglobin: 15.4 g/dL (ref 13.0–17.0)
Lymphocytes Relative: 37.8 % (ref 12.0–46.0)
Lymphs Abs: 1.6 10*3/uL (ref 0.7–4.0)
MCHC: 34.1 g/dL (ref 30.0–36.0)
MCV: 94.2 fl (ref 78.0–100.0)
Monocytes Absolute: 0.3 10*3/uL (ref 0.1–1.0)
Monocytes Relative: 6.1 % (ref 3.0–12.0)
Neutro Abs: 2.1 10*3/uL (ref 1.4–7.7)
Neutrophils Relative %: 50.9 % (ref 43.0–77.0)
Platelets: 213 10*3/uL (ref 150.0–400.0)
RBC: 4.79 Mil/uL (ref 4.22–5.81)
RDW: 12.7 % (ref 11.5–14.6)
WBC: 4.1 10*3/uL — ABNORMAL LOW (ref 4.5–10.5)

## 2012-08-24 LAB — HEPATIC FUNCTION PANEL
ALT: 22 U/L (ref 0–53)
AST: 21 U/L (ref 0–37)
Albumin: 4.1 g/dL (ref 3.5–5.2)
Alkaline Phosphatase: 43 U/L (ref 39–117)
Bilirubin, Direct: 0.1 mg/dL (ref 0.0–0.3)
Total Bilirubin: 0.8 mg/dL (ref 0.3–1.2)
Total Protein: 6.4 g/dL (ref 6.0–8.3)

## 2012-08-24 LAB — BASIC METABOLIC PANEL
BUN: 14 mg/dL (ref 6–23)
CO2: 31 mEq/L (ref 19–32)
Calcium: 9.4 mg/dL (ref 8.4–10.5)
Chloride: 100 mEq/L (ref 96–112)
Creatinine, Ser: 1 mg/dL (ref 0.4–1.5)
GFR: 87.63 mL/min (ref >60.00)
Glucose, Bld: 93 mg/dL (ref 70–99)
Potassium: 4.4 mEq/L (ref 3.5–5.1)
Sodium: 139 mEq/L (ref 135–145)

## 2012-08-24 LAB — URINALYSIS
Bilirubin Urine: NEGATIVE
Hgb urine dipstick: NEGATIVE
Ketones, ur: NEGATIVE
Leukocytes, UA: NEGATIVE
Nitrite: NEGATIVE
Specific Gravity, Urine: 1.01 (ref 1.000–1.030)
Total Protein, Urine: NEGATIVE
Urine Glucose: NEGATIVE
Urobilinogen, UA: 0.2 (ref 0.0–1.0)
pH: 7 (ref 5.0–8.0)

## 2012-08-24 LAB — TESTOSTERONE: Testosterone: 432.28 ng/dL (ref 350.00–890.00)

## 2012-08-24 LAB — TSH: TSH: 1.37 u[IU]/mL (ref 0.35–5.50)

## 2012-08-24 LAB — LIPID PANEL
Cholesterol: 207 mg/dL — ABNORMAL HIGH (ref 0–200)
HDL: 60.2 mg/dL (ref >39.00)
Total CHOL/HDL Ratio: 3
Triglycerides: 68 mg/dL (ref 0.0–149.0)
VLDL: 13.6 mg/dL (ref 0.0–40.0)

## 2012-08-24 LAB — PSA: PSA: 1.4 ng/mL (ref 0.10–4.00)

## 2012-08-24 LAB — LDL CHOLESTEROL, DIRECT: Direct LDL: 135.3 mg/dL

## 2012-08-25 ENCOUNTER — Encounter: Payer: Self-pay | Admitting: Pulmonary Disease

## 2012-08-25 ENCOUNTER — Ambulatory Visit (INDEPENDENT_AMBULATORY_CARE_PROVIDER_SITE_OTHER): Payer: BC Managed Care – PPO | Admitting: Pulmonary Disease

## 2012-08-25 VITALS — BP 112/76 | HR 76 | Temp 98.7°F | Ht 69.0 in | Wt 156.9 lb

## 2012-08-25 DIAGNOSIS — Z0000 Encounter for general adult medical examination without abnormal findings: Secondary | ICD-10-CM

## 2012-08-25 DIAGNOSIS — G2581 Restless legs syndrome: Secondary | ICD-10-CM

## 2012-08-25 DIAGNOSIS — F411 Generalized anxiety disorder: Secondary | ICD-10-CM

## 2012-08-25 DIAGNOSIS — R51 Headache: Secondary | ICD-10-CM

## 2012-08-25 DIAGNOSIS — E785 Hyperlipidemia, unspecified: Secondary | ICD-10-CM

## 2012-08-25 DIAGNOSIS — M1990 Unspecified osteoarthritis, unspecified site: Secondary | ICD-10-CM

## 2012-08-25 DIAGNOSIS — K219 Gastro-esophageal reflux disease without esophagitis: Secondary | ICD-10-CM

## 2012-08-25 DIAGNOSIS — D126 Benign neoplasm of colon, unspecified: Secondary | ICD-10-CM

## 2012-08-25 DIAGNOSIS — J209 Acute bronchitis, unspecified: Secondary | ICD-10-CM

## 2012-08-25 HISTORY — DX: Restless legs syndrome: G25.81

## 2012-08-25 MED ORDER — ROPINIROLE HCL 0.5 MG PO TABS: ORAL_TABLET | ORAL | Status: DC

## 2012-08-25 NOTE — Patient Instructions (Addendum)
Today we updated your med list in our EPIC system...    Continue your current medications the same...    We decided to change your Mirapex to REQUIP 0.5mg  tabs- start w/ one at bedtime & increase to 2 tabs as needed...  We reviewed your recent fasting blood work & gave you a copy for your records...  We will refer your chart to our GI Dept & you should hear from drKaplan's team shortly (please let me know if there is a breakdown in communication)...  Call for any problems...  Let's plan a follow up visit in 22yr, sooner if needed for problems.Marland KitchenMarland Kitchen

## 2012-08-27 ENCOUNTER — Encounter: Payer: Self-pay | Admitting: Pulmonary Disease

## 2012-08-27 NOTE — Progress Notes (Signed)
Subjective:    Patient ID: Randy Moran, male    DOB: [birth year], 52 y.o.   MRN: [id number]  HPI 52 y/o WM here for a follow up visit and CPX...  he has multiple medical problems as noted below...    ~  May 19, 2011:  76mo ROV & urgent add-on appt for severe headache> frontal, temporal & occipital too; continuous 8-9/10 severity; mult assoc symptoms> right ear pops, N w/o V, body aches but no f/c/s, legs hurt & hard to walk;  Relpax Rx per DrHagen w/o relief;  He saw DrHagen 9/12 & her note is reviewed> she was doing a trial of Botox for his chr HAs, plus his Relpax40mg  prn, & Mirapex for RLS; f/u planned 75mo...    We discussed Rx w/ Depo80, Pred Dosepak, Tamadol & Tylenol, w/ f/u ASAP with his Neurologist/ HA specialist DrHagen (now in [place]);  He reminds me that he cannot tolerate even the low dose Pred dosepak due to agitation, & is intol to the Tramadol as well;  He was intol to Vicodin prev w/ confusion req admission in 2010;  Therefore he will need to continue meds per DrHagen & follow up w/ her ASAP (we will call to set this up)==> seen & given trigger point injection + Diclofenac; pain resolved.    EPIC review shows XRays of Neck (DDD at C6-7), Back (neg- normal alignment & disc spaces), & pelvis (WNL) 7/12 by [id number]- his Chiropractor; no mention from the pt about chiropractic therapy or any signif HA relief from their adjustments... He has also seen DrRamos in the past w/ cervical disc dis & prev had injection...  ~  August 13, 2011:  CPX & 75mo ROV> his CC is Insomnia & tired all the time w/ decr energy etc;  He denies snoring but has lots of movement according to wife (prev on Mirapex per DrHagan) & no reported apneas==> ESS is 14/24 & we discussed need for sleep study (to be scheduled at his convenience);  He also notes a bulge in right groin & exam confirms recurrent RIH==> refer to [facility] for elective repair;  See prob list below >> CXR 3/13 showed normal heart size, clear lungs,  NAD.Marland KitchenMarland Kitchen EKG 3/13 showed NSR, rate82, WNL, NAD... LABS 12/12:  Chems- wnl;  CBC- wnl;  TSH=1.21 LABS 3/13:  FLP- ok x TChol/LDL not at goals;  PSA=1.18;  Testos=625;  UA- clear  ADDENDUM 10/13 >> he finally had sleep study >> AHI=3, worse supine, mod snoring, some limb movements w/ sleep disruption; asked to get off his back at night, & he endorses PLMS w/ disturbance to wife etc; therefore trial MIRAPEX 0.5mg => incr to 1mg  as needed...  ~  August 25, 2012:  Yearly ROV & CPX> Randy Moran has had a good yr overall- he had bilat laparoscopic inguinal hernia repairs w/ mesh 4/13 by Trecia Rogers, some pain post op then back to baseline & dong well; He saw DrHagen for f/u HAs- on Relpax now; He is due for f/u colonoscopy & we will refer.... We reviewed the following medical problems during today's office visit >>     Glaucoma> on eye drops from DrCashwell; he reports good control on Rx...    Hyperlipid> on diet alone, intol to statins in past; FLP shows TChol 207, TG 68, HDL 60, LDL 135 and he needs better diet or consider med Rx...    GI- GERD, Polyps> on OTC Prilosec as needed; last colon was 4/09 by Dorris Singh w/ divertics & 3mm  hyperplastic polyp; +FamHx colon ca in mother noted...    DJD- neck pain, LBP, myofascial pain> on Tylenol, Ibuprofen, etc; Dr Sharene Skeans has given him botox injections...    HAs, migraines> on Relpax per Neuro- DrHagen; he saw DrHagen 3/13 for severe HA, given Botox & [place] for injection, now on Relpax...    RLS> on Mirapex0.5-2Qhs; he has seen DrHagen for this as well (she lists 0.125mg -3Qhs) but he says the Mirapex causes sinus congestion & HA (INTOL); we discussed switch to Requip0.5mg -1=>2Qhs...    Anxiety, Insomnia> not currently on meds... We reviewed prob list, meds, xrays and labs> see below for updates >>  He had the 2013 flu vaccine 9/13; meds refilled today... LABS 3/14:  FLP- not quite at goals on diet alone;  Chems- wnl;  CBC- wnl;  TSH=1.37;  PSA=1.40;  Testos=432:  UA-  clear...           Problem List:     PROBLEM LIST UPDATED 08/13/11 >>  GLAUCOMA >> on Xalatan eye drops from DrCashwell w/ good control according to the pt...  Hx of ASTHMATIC BRONCHITIS, ACUTE (ICD-466.0) - AB exac in 2009 w/ Biaxin, Prednisone, Tussionex... finally resolved and doesn't require regular inhalers so far... he also had a sinusitis & dizzy eval by [facility] w/ CT Sinuses showing clear x sm amt of fluid in sphenoid sinus- he states all symptoms resolved w/ the Pred Rx... ~  3/13: He denies breathing difficulties, cough, phlegm, CP, palpit, SOB, edema, etc... ~  CXR 3/13 showed normal heart size, clear lungs, NAD... ~  3/14:  Doing well- no resp problems or infectious exac over the last >36yr...  HYPERLIPIDEMIA (ICD-272.4) - prev on Simvastatin 20mg /d, but pt stopped due to "feeling bad" & now on diet alone... ~  FLP 4/07 showed TChol 238, TG 64, HDL 56, LDL 171... rec> try Simva20... pt dc'd on his own. ~  FLP 8/08 showed TChol 230, TG 65, HDL 52, LDL 176... rec> vigorous diet efforts, refuses med. ~  FLP 11/08 showed TChol 173, TG 61, HDL 55, LDL 106... fantastic job w/ diet, add exercise. ~  FLP 10/09 showed TChol 176, TG 50, HDL 60, LDL 106 ~  FLP 11/10 showed TChol 238, TG 52, HDL 61, LDL 170... what happened? he still prefers diet Rx. ~  FLP 2/12 showed TChol 221, TG 49, HDL 64, LDL 141... discussed diet + exercise, he refuses meds. ~  FLP 3/13 on diet alone showed TChol 208, TG 94, HDL 57, LDL 128... Sl improved, continue diet efforts. ~  FLP 3/14 on diet alone showed TChol 207, TG 68, HDL 60, LDL 135... Needs better diet if he is to avoid meds.  GERD (ICD-530.81) - on PRILOSEC 20mg /d Prn... followed by Dorris Singh for GI... he was in a GI drug study of reflux & the study ended... last EGD was 9/08 showing esophagitis... he uses PPI meds just as needed he says...  COLONIC POLYPS (ICD-211.3) - colonoscopy 4/09 by Dorris Singh showed divertics & 3mm polyp= hyperplastic... f/u planned  ?10 yrs (there is a + FamHx of colon cancer in his mother). ~  3/14: on OTC Prilosec as needed; last colon was 4/09 by Dorris Singh w/ divertics & 3mm hyperplastic polyp; +FamHx colon ca in mother noted.  INGUINAL HERNIA >> 3/13 he noted lump in right groin c/e RIH x several months;  He has hx of Bilat Inguinal Hernias repaired in the 1960s==> we will refer to [facility] for surgical consideration.  ? of PROSTATITIS NOS (ICD-601.9) -  [facility] by [facility] for prostatitis... pt had f/u w/ Urology in 2010 and everything was fine. ~  Labs 3/13 showed PSA= 1.18, and Testos= 625... ~  Labs 3/14 showed PSA= 1.40, and Testos= 432...  DEGENERATIVE JOINT DISEASE (ICD-715.90) - prev evals by Drs Bary Leriche and Sheridan w/ surgery to both shoulders, knee x2, and wrist... he has also seen a chiropractor- DrWillen, w/ CSpine problems; & now [employer]... ~  3/13: He tells me that DrNorris is coordinating his Ortho care & worst prob is his shoulders w/ 4 ops- 3 right & 1 left, and still he has difficulty; using Advil/ Tylenol/ PT...  NECK PAIN LOW BACK PAIN (ICD-724.2) MYOFASCIAL PAIN SYNDROME (ICD-729.1) - he has seen DrZeminski in the past for ? fibromyalgia and lumbar DDD... ~  7/12: He saw Chiropractor Evlyn Kanner for neck/ back pain & XRays in EPIC revealed> CSpine- straightened alignment with degenerative disc disease at C6-7; L/SSpine- norm alignment, norm disc spaces; Pelvis- normal... ~  3/14:  on Tylenol, Ibuprofen, etc; Dr Sharene Skeans has given him botox injections...  HEADACHE (ICD-784.0) - [facility]> felt to be migraine headaches and treated w/ mult meds over the yrs includingTopamax, Baclofen, RELPAX, Tylenol & Ibuprofen/ Diclofenac; he reminds me that he is INTOLERANT to Vicodin, Tramadol, Prednisone...  ~  Note: also saw DrStiefel in 1983 for severe incapacitating headaches... ~  Hosp 10/10 w/ altered mental status prob related to meds  + Vicodin given for shoulder surg> resolved off meds & f/u w/ DrHagen to re-assess his migraines> just using Prn Relax now. ~  12/12: severe HA & no relief from his Relpax & Chlorpromazine from DrHagan (prev tried Botox as well);  f/u w/ Neuro & treated w/ trigger point injection + Diclofenac & more Botox planned. ~  3/14:  on Relpax per Neuro- DrHagen; he saw DrHagen 3/13 for severe HA, given Botox & [place] for injection, now on Relpax.  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 ~  EEG 10/10 was normal/ neg... ~  MRI/ MRA Brain 10/10 showed NAD, scattered subcortical T2 hyperintensities are sl greater in number than expected for age, MRA= normal.  ~  CT CSpine 10/10 shows DDD & spondylosis lower CSpine... ~  CDopplers 10/10 were WNL... ~  2DEcho 10/10 was normal w/ EF= 60-65% & no wall motion abn...  ANXIETY & INSOMNIA >>  PHYSICAL EXAMINATION (ICD-V70.0) - he takes ASA 81mg /d... ~  Colon: mother had colon cancer- colonoscopy 4/09 by Dorris Singh w/ 3mm hyperpl polyp... ~  Immunizations:  he gets the yearly Flu vaccines;  and had TDAP 11/10...   Past Surgical History  Procedure Laterality Date  . Bilateral inguinal herniorrhaphy      1960's  . Appendectomy  1967  . Knee surgery x 2  2004,2008  . Bilateral shoulder surgery  2006, 2007,2010  . Ganglion cyst surgery  2009  . Hernia repair  09/30/11    bilateral inguinal hernia    Outpatient Encounter Prescriptions as of 08/25/2012  Medication Sig Dispense Refill  . acetaminophen (TYLENOL) 500 MG tablet Take 500 mg by mouth every 6 (six) hours as needed.      . eletriptan (RELPAX) 40 MG tablet One tablet by mouth at onset of headache. May repeat in 2 hours if headache persists or recurs. may repeat in 2 hours if necessary      .  latanoprost (XALATAN) 0.005 % ophthalmic solution As needed      . naproxen sodium (ANAPROX) 220 MG tablet Take 220 mg by mouth as  needed.      Marland Kitchen rOPINIRole (REQUIP) 0.5 MG tablet Take one tablet by mouth at bedtime - Increase to 2 tablets at bedtime as needed  60 tablet  1  . [DISCONTINUED] ALSUMA 6 MG/0.5ML SOLN injection Ad lib.      . [DISCONTINUED] Ibuprofen (ADVIL) 200 MG CAPS Take by mouth as needed.      . [DISCONTINUED] oseltamivir (TAMIFLU) 75 MG capsule Take 1 capsule (75 mg total) by mouth 2 (two) times daily.  10 capsule  0  . [DISCONTINUED] pramipexole (MIRAPEX) 0.5 MG tablet Take 1 po qhs x 2 weeks then increase to 2 tablets by mouth at bedtime.  60 tablet  5   No facility-administered encounter medications on file as of 08/25/2012.    Allergies  Allergen Reactions  . Levofloxacin     REACTION: rash  . Prednisone     pred pak makes the pt very agitated--pt can tolerate the depo medrol injection  . Sulfamethoxazole W-Trimethoprim     REACTION: rash    Current Medications, Allergies, Past Medical History, Past Surgical History, Family History, and Social History were reviewed in [employer] record.    Review of Systems        The patient complains of malaise, nasal congestion, dyspnea on exertion, indigestion/heartburn, paresthesias, and anxiety.  The patient denies fever, chills, sweats, anorexia, fatigue, weakness, weight loss, sleep disorder, blurring, diplopia, eye irritation, eye discharge, vision loss, eye pain, photophobia, earache, ear discharge, tinnitus, decreased hearing, nosebleeds, sore throat, hoarseness, chest pain, palpitations, syncope, orthopnea, PND, peripheral edema, cough, dyspnea at rest, excessive sputum, hemoptysis, wheezing, pleurisy, nausea, vomiting, diarrhea, constipation, change in bowel habits, abdominal pain, melena, hematochezia, jaundice, gas/bloating, dysphagia, odynophagia, dysuria, hematuria, urinary frequency, urinary hesitancy, nocturia, incontinence, back pain, joint pain, joint swelling, muscle cramps, muscle weakness, stiffness, arthritis,  sciatica, restless legs, leg pain at night, leg pain with exertion, rash, itching, dryness, suspicious lesions, paralysis, seizures, tremors, vertigo, transient blindness, frequent falls, frequent headaches, difficulty walking, depression, memory loss, confusion, cold intolerance, heat intolerance, polydipsia, polyphagia, polyuria, unusual weight change, abnormal bruising, bleeding, enlarged lymph nodes, urticaria, allergic rash, hay fever, and recurrent infections.     Objective:   Physical Exam     WD, WN, 52 y/o WM in NAD... GENERAL:  Alert & oriented; pleasant & cooperative... HEENT:  [place]/AT, EOM-wnl, PERRLA, Fundi-benign, EACs-clear, TMs-wnl, NOSE-clear, THROAT-clear & wnl. NECK:  Supple w/ fairROM; no JVD; normal carotid impulses w/o bruits; no thyromegaly or nodules palpated; no lymphadenopathy. CHEST:  Clear to P & A; without wheezes/ rales/ or rhonchi. HEART:  Regular Rhythm; without murmurs/ rubs/ or gallops. ABDOMEN:  Soft & nontender; normal bowel sounds; no organomegaly or masses detected.    Recurrent right inguinal hernia  RECTAL:  Neg - prostate 2+ & nontender w/o nodules; stool hematest neg. EXT: without deformities, mild arthritic changes; no varicose veins/ venous insuffic/ or edema. scars of shoulder surgeries & some decr ROM... NEURO:  CN's intact; motor testing normal; sensory testing normal; gait normal & balance OK. DERM:  No lesions noted; no rash etc...  RADIOLOGY DATA:  Reviewed in the EPIC EMR & discussed w/ the patient...  LABORATORY DATA:  Reviewed in the EPIC EMR & discussed w/ the patient...   Assessment & Plan:    CPX>>  INSOMNIA, Fatigue, no energy==> Labs  normal, Testos normal, needs Sleep Study ==> pending  Hx AB>  No recent exac & he is not on any regular breathing meds...  Hyperlipid>  On diet alone as he was intol to low dose Simva in past & refused other statins or Lipid Clinic; he's doing the best he can on diet alone...  GI> GERD, Polyps>   Prev on Protonix, he stopped on his own; reminded to use OTC Prilosec as needed for symptoms...  DJD, LBP, ?FM>  He has seen DrZ in the past,?FM, known lumbar DDD, he has also seen Chiropractors for XRays and adjustments; prev eval CSpine by DrRamos w/ shot...  Severe Migraine HAs>  These have been present x yrs and managed by DrHagen HA clinic; she has tried Botox etc; he will need to maintain f/u w/ her regularly...  Anxiety>  Not currently on anxiotytic rx...   Patient's Medications  New Prescriptions   ROPINIROLE (REQUIP) 0.5 MG TABLET    Take one tablet by mouth at bedtime - Increase to 2 tablets at bedtime as needed  Previous Medications   ACETAMINOPHEN (TYLENOL) 500 MG TABLET    Take 500 mg by mouth every 6 (six) hours as needed.   ELETRIPTAN (RELPAX) 40 MG TABLET    One tablet by mouth at onset of headache. May repeat in 2 hours if headache persists or recurs. may repeat in 2 hours if necessary   LATANOPROST (XALATAN) 0.005 % OPHTHALMIC SOLUTION    As needed   NAPROXEN SODIUM (ANAPROX) 220 MG TABLET    Take 220 mg by mouth as needed.  Modified Medications   No medications on file  Discontinued Medications   ALSUMA 6 MG/0.5ML SOLN INJECTION    Ad lib.   IBUPROFEN (ADVIL) 200 MG CAPS    Take by mouth as needed.   OSELTAMIVIR (TAMIFLU) 75 MG CAPSULE    Take 1 capsule (75 mg total) by mouth 2 (two) times daily.   PRAMIPEXOLE (MIRAPEX) 0.5 MG TABLET    Take 1 po qhs x 2 weeks then increase to 2 tablets by mouth at bedtime.

## 2012-08-29 ENCOUNTER — Telehealth: Payer: Self-pay | Admitting: Gastroenterology

## 2012-08-29 ENCOUNTER — Other Ambulatory Visit: Payer: Self-pay | Admitting: Pulmonary Disease

## 2012-08-29 DIAGNOSIS — D126 Benign neoplasm of colon, unspecified: Secondary | ICD-10-CM

## 2012-08-29 NOTE — Telephone Encounter (Signed)
If her mother had colon cancer below the age of 71 then she should have a 5 year recall

## 2012-08-29 NOTE — Telephone Encounter (Signed)
Left message for pt to call back  °

## 2012-08-29 NOTE — Telephone Encounter (Signed)
Pts mother had colon cancer. Pts colon recall in the system is for 10years. Pt wants to know if he should have another colon in 5years due to the family history. Please advise.

## 2012-08-30 NOTE — Telephone Encounter (Signed)
Pt states that his mother was diagnosed with colon cancer in her 31's. Pts recall changed to 5 years and he would be due for a colon in April 2014. Pt states he will look at his schedule and call back to schedule the colon in April.

## 2012-08-30 NOTE — Telephone Encounter (Signed)
Left message for pt to call back  °

## 2012-09-04 ENCOUNTER — Other Ambulatory Visit: Payer: Self-pay | Admitting: Neurosurgery

## 2012-09-04 DIAGNOSIS — M47812 Spondylosis without myelopathy or radiculopathy, cervical region: Secondary | ICD-10-CM

## 2012-09-11 ENCOUNTER — Other Ambulatory Visit: Payer: BC Managed Care – PPO

## 2012-09-11 ENCOUNTER — Encounter: Payer: Self-pay | Admitting: Gastroenterology

## 2012-09-12 ENCOUNTER — Ambulatory Visit
Admission: RE | Admit: 2012-09-12 | Discharge: 2012-09-12 | Disposition: A | Discharge status: Home / Self Care | Payer: BC Managed Care – PPO | Source: Ambulatory Visit | Attending: Neurosurgery | Admitting: Neurosurgery

## 2012-09-12 DIAGNOSIS — M47812 Spondylosis without myelopathy or radiculopathy, cervical region: Secondary | ICD-10-CM

## 2012-12-01 ENCOUNTER — Encounter: Payer: Self-pay | Admitting: Gastroenterology

## 2012-12-04 ENCOUNTER — Telehealth: Payer: Self-pay | Admitting: Pulmonary Disease

## 2012-12-04 DIAGNOSIS — D126 Benign neoplasm of colon, unspecified: Secondary | ICD-10-CM

## 2012-12-04 DIAGNOSIS — K219 Gastro-esophageal reflux disease without esophagitis: Secondary | ICD-10-CM

## 2012-12-04 NOTE — Telephone Encounter (Signed)
lmomtcb to discuss issues with pt. 

## 2012-12-04 NOTE — Telephone Encounter (Signed)
Pt returned call. [phone]. Randy Moran

## 2012-12-04 NOTE — Telephone Encounter (Signed)
Called and spoke with pt and he stated that he had to cancel his last appt for colon. He stated that he has been calling to reschedule this appt but keeps getting told that he will have to call back in 2 wks since they are booked up as far out as the schedule goes.  Pt stated that this was partly his fault since he had to cancel his last appt but he is having a hard time rescheduling this appt.  Pt requested that we place an order for him to be set up for colon and he will take any appt they have. Order has been placed for the pt and nothing further is needed.

## 2012-12-05 ENCOUNTER — Encounter: Payer: Self-pay | Admitting: Gastroenterology

## 2013-01-08 ENCOUNTER — Telehealth: Payer: Self-pay | Admitting: Pulmonary Disease

## 2013-01-08 NOTE — Telephone Encounter (Signed)
I spoke with pt. He stated he was sick last week. He could not get in to see SN bc he had no openings and per pt TP was not in. His spouse was trying to get pt in to see Dr. Jonny Ruiz (her PCP). He just wants to make it clear he was not changing PCP's if they happen to call for his records. Pt did not see Dr. Jonny Ruiz. He stated this is just FYI for [place] in case this happens and they request for his records. Nothing further was needed. Will forward to leigh and SN as an [place]

## 2013-01-19 ENCOUNTER — Ambulatory Visit: Payer: BC Managed Care – PPO | Admitting: Internal Medicine

## 2013-03-01 ENCOUNTER — Ambulatory Visit (AMBULATORY_SURGERY_CENTER): Payer: Self-pay | Admitting: *Deleted

## 2013-03-01 ENCOUNTER — Encounter: Payer: Self-pay | Admitting: Gastroenterology

## 2013-03-01 VITALS — Ht 69.0 in | Wt 153.6 lb

## 2013-03-01 DIAGNOSIS — Z800 Family history of malignant neoplasm of digestive organs: Secondary | ICD-10-CM

## 2013-03-01 DIAGNOSIS — Z86010 Personal history of colonic polyps: Secondary | ICD-10-CM

## 2013-03-01 MED ORDER — NA SULFATE-K SULFATE-MG SULF 17.5-3.13-1.6 GM/177ML PO SOLN: 1.0000 | Freq: Once | ORAL | Status: DC

## 2013-03-01 NOTE — Progress Notes (Signed)
Denies allergies to eggs or soy products. Denies complications with sedation or anesthesia. 

## 2013-03-02 ENCOUNTER — Encounter: Payer: BC Managed Care – PPO | Admitting: Gastroenterology

## 2013-03-16 ENCOUNTER — Encounter: Payer: Self-pay | Admitting: Gastroenterology

## 2013-03-16 ENCOUNTER — Ambulatory Visit (AMBULATORY_SURGERY_CENTER): Payer: BC Managed Care – PPO | Admitting: Gastroenterology

## 2013-03-16 VITALS — BP 139/73 | HR 68 | Temp 97.5°F | Resp 10 | Ht 69.0 in | Wt 153.0 lb

## 2013-03-16 DIAGNOSIS — K648 Other hemorrhoids: Secondary | ICD-10-CM

## 2013-03-16 DIAGNOSIS — Z86010 Personal history of colonic polyps: Secondary | ICD-10-CM

## 2013-03-16 DIAGNOSIS — Z800 Family history of malignant neoplasm of digestive organs: Secondary | ICD-10-CM

## 2013-03-16 MED ORDER — SODIUM CHLORIDE 0.9 % IV SOLN: 500.0000 mL | INTRAVENOUS | Status: DC

## 2013-03-16 NOTE — Op Note (Signed)
[facility] [address] [place], [address]   COLONOSCOPY PROCEDURE REPORT  PATIENT: Randy Moran, Randy Moran  MR#: [id number] BIRTHDATE: 1961-05-02 , 51  yrs. old GENDER: Male ENDOSCOPIST: Louis Meckel, MD REFERRED BY: PROCEDURE DATE:  03/16/2013 PROCEDURE:   Colonoscopy, diagnostic First Screening Colonoscopy - Avg.  risk and is 50 yrs.  old or older - No.  Prior Negative Screening - Now for repeat screening. Above average risk  History of Adenoma - Now for follow-up colonoscopy & has been > or = to 3 yrs.  N/A  Polyps Removed Today? No.  Recommend repeat exam, <10 yrs? Yes.  High risk (family or personal hx). ASA CLASS:   Class II INDICATIONS:Patient's immediate family history of colon cancer. MEDICATIONS: MAC sedation, administered by CRNA and propofol (Diprivan) 250mg  IV  DESCRIPTION OF PROCEDURE:   After the risks benefits and alternatives of the procedure were thoroughly explained, informed consent was obtained.  A digital rectal exam revealed no abnormalities of the rectum.   The LB [id number]  endoscope was introduced through the anus and advanced to the cecum, which was identified by both the appendix and ileocecal valve. No adverse events experienced.   The quality of the prep was Suprep good  The instrument was then slowly withdrawn as the colon was fully examined.      COLON FINDINGS: Internal hemorrhoids were found.   The colon mucosa was otherwise normal.  Retroflexed views revealed no abnormalities. The time to cecum=3 minutes 15 seconds.  Withdrawal time=8 minutes 12 seconds.  The scope was withdrawn and the procedure completed. COMPLICATIONS: There were no complications.  ENDOSCOPIC IMPRESSION: 1.   Internal hemorrhoids 2.   The colon mucosa was otherwise normal  RECOMMENDATIONS: Given your significant family history of colon cancer, you should have a repeat colonoscopy in 5 years   eSigned:  Louis Meckel, MD 03/16/2013  9:31 AM   cc: Michele Mcalpine, MD and Zelphia Cairo MD

## 2013-03-16 NOTE — Progress Notes (Signed)
Stable to RR 

## 2013-03-16 NOTE — Progress Notes (Signed)
Patient did not experience any of the following events: a burn prior to discharge; a fall within the facility; wrong site/side/patient/procedure/implant event; or a hospital transfer or hospital admission upon discharge from the facility. (G8907)Patient did not have preoperative order for IV antibiotic SSI prophylaxis. ([id number]) ewm 

## 2013-03-16 NOTE — Patient Instructions (Addendum)
YOU HAD AN ENDOSCOPIC PROCEDURE TODAY AT THE [facility]: Refer to the procedure report that was given to you for any specific questions about what was found during the examination.  If the procedure report does not answer your questions, please call your gastroenterologist to clarify.  If you requested that your care partner not be given the details of your procedure findings, then the procedure report has been included in a sealed envelope for you to review at your convenience later.  YOU SHOULD EXPECT: Some feelings of bloating in the abdomen. Passage of more gas than usual.  Walking can help get rid of the air that was put into your GI tract during the procedure and reduce the bloating. If you had a lower endoscopy (such as a colonoscopy or flexible sigmoidoscopy) you may notice spotting of blood in your stool or on the toilet paper. If you underwent a bowel prep for your procedure, then you may not have a normal bowel movement for a few days.  DIET: Your first meal following the procedure should be a light meal and then it is ok to progress to your normal diet.  A half-sandwich or bowl of soup is an example of a good first meal.  Heavy or fried foods are harder to digest and may make you feel nauseous or bloated.  Likewise meals heavy in dairy and vegetables can cause extra gas to form and this can also increase the bloating.  Drink plenty of fluids but you should avoid alcoholic beverages for 24 hours.  ACTIVITY: Your care partner should take you home directly after the procedure.  You should plan to take it easy, moving slowly for the rest of the day.  You can resume normal activity the day after the procedure however you should NOT DRIVE or use heavy machinery for 24 hours (because of the sedation medicines used during the test).    SYMPTOMS TO REPORT IMMEDIATELY: A gastroenterologist can be reached at any hour.  During normal business hours, 8:30 AM to 5:00 PM Monday through Friday,  call [phone].  After hours and on weekends, please call the GI answering service at [phone]  Emergency number who will take a message and have the physician on call contact you.   Following lower endoscopy (colonoscopy or flexible sigmoidoscopy):  Excessive amounts of blood in the stool  Significant tenderness or worsening of abdominal pains  Swelling of the abdomen that is new, acute  Fever of 100F or higher  FOLLOW UP: If any biopsies were taken you will be contacted by phone or by letter within the next 1-3 weeks.  Call your gastroenterologist if you have not heard about the biopsies in 3 weeks.  Our staff will call the home number listed on your records the next business day following your procedure to check on you and address any questions or concerns that you may have at that time regarding the information given to you following your procedure. This is a courtesy call and so if there is no answer at the home number and we have not heard from you through the emergency physician on call, we will assume that you have returned to your regular daily activities without incident.  SIGNATURES/CONFIDENTIALITY: You and/or your care partner have signed paperwork which will be entered into your electronic medical record.  These signatures attest to the fact that that the information above on your After Visit Summary has been reviewed and is understood.  Full responsibility of the   confidentiality of this discharge information lies with you and/or your care-partner.  Handout on hemorrhoids Repeat colon in 5 years

## 2013-03-19 ENCOUNTER — Telehealth: Payer: Self-pay | Admitting: *Deleted

## 2013-03-19 NOTE — Telephone Encounter (Signed)
  Follow up Call-  Call back number 03/16/2013  Post procedure Call Back phone  # [phone]  Permission to leave phone message Yes     Patient questions:  Do you have a fever, pain , or abdominal swelling? no Pain Score  0 *  Have you tolerated food without any problems? yes  Have you been able to return to your normal activities? yes  Do you have any questions about your discharge instructions: Diet   no Medications  no Follow up visit  no  Do you have questions or concerns about your Care? no  Actions: * If pain score is 4 or above: No action needed, pain <4.

## 2013-03-23 DIAGNOSIS — M5137 Other intervertebral disc degeneration, lumbosacral region: Secondary | ICD-10-CM | POA: Insufficient documentation

## 2013-03-23 DIAGNOSIS — M5126 Other intervertebral disc displacement, lumbar region: Secondary | ICD-10-CM | POA: Insufficient documentation

## 2013-04-10 ENCOUNTER — Ambulatory Visit (INDEPENDENT_AMBULATORY_CARE_PROVIDER_SITE_OTHER): Payer: BC Managed Care – PPO

## 2013-04-10 VITALS — BP 128/81 | HR 63 | Resp 12 | Ht 69.0 in | Wt 150.0 lb

## 2013-04-10 DIAGNOSIS — M7751 Other enthesopathy of right foot: Secondary | ICD-10-CM

## 2013-04-10 DIAGNOSIS — R52 Pain, unspecified: Secondary | ICD-10-CM

## 2013-04-10 DIAGNOSIS — M7750 Other enthesopathy of unspecified foot: Secondary | ICD-10-CM

## 2013-04-10 DIAGNOSIS — M21619 Bunion of unspecified foot: Secondary | ICD-10-CM

## 2013-04-10 DIAGNOSIS — M7150 Other bursitis, not elsewhere classified, unspecified site: Secondary | ICD-10-CM

## 2013-04-10 DIAGNOSIS — M21611 Bunion of right foot: Secondary | ICD-10-CM

## 2013-04-10 MED ORDER — TRIAMCINOLONE ACETONIDE 10 MG/ML IJ SUSP: 10.0000 mg | Freq: Once | INTRAMUSCULAR | Status: DC

## 2013-04-10 NOTE — Progress Notes (Signed)
Subjective:    Patient ID: Randy Moran, male    DOB: [birth year], 52 y.o.   MRN: [id number] "I've got pain on the side of my right foot.  I've seen an Orthopedic."   Foot Pain The current episode started more than 1 year ago. The problem occurs constantly. The problem has been gradually worsening. Associated symptoms include arthralgias and headaches. The symptoms are aggravated by standing, walking and bending. He has tried acetaminophen, NSAIDs and rest for the symptoms. The treatment provided mild relief.   patient describes a history of injury 2 years ago to the right fifth MTP area since that times had recurrent pain in describes as bursitis of the fifth MTP area. Has had multiple treatments including as many as 5 steroid injections the last couple of years. Most recent injection was in July of this year. Has been on different NSAIDs and Dosepaks. Earlier this year they have start wearing steel toe workboots as mandated by [employer], these have certainly aggravated the situation more.    Review of Systems  Constitutional: Negative.   Eyes: Positive for visual disturbance.  Respiratory: Negative.   Cardiovascular: Negative.   Gastrointestinal: Negative.   Endocrine: Negative.   Genitourinary: Negative.   Musculoskeletal: Positive for arthralgias and back pain.  Skin: Negative.   Allergic/Immunologic: Negative.   Neurological: Positive for headaches.  Hematological: Negative.   Psychiatric/Behavioral: Positive for sleep disturbance.       Objective:   Physical Exam  Constitutional: He is oriented to person, place, and time. He appears well-developed and well-nourished.  Cardiovascular:  Pulses:      Dorsalis pedis pulses are 2+ on the right side, and 2+ on the left side.       Posterior tibial pulses are 2+ on the right side, and 2+ on the left side.  Capillary refill timed 3 seconds all digits bilateral skin temperature warm bilateral . No edema rubor. No varicosities  noted  Musculoskeletal:  Orthopedic biomechanical exam reveals rectus foot type bilateral. 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 History of injury to the right fifth MTP area 2 years ago. Continues to have some slight edema with a complaint of pain fifth MTP right with notable tailor bunion deformity. Next  X-rays reveal no signs of fracture or osseous abnormality small os peroneum is identified on oblique and lateral project  Neurological: He is alert and oriented to person, place, and time. He has normal strength and normal reflexes.  Epicritic and proprioceptive sensations intact and symmetric bilateral  Skin: Skin is warm and dry. No cyanosis. Nails show no clubbing.  Skin color pigment and hair growth are normal bilateral  Psychiatric: He has a normal mood and affect. His behavior is normal.          Assessment & Plan:  Assessment this time is capsulitis/bursitis secondary Taylor bunion deformity right. There is history of contusion to this area although no active fracture or other osseous abnormalities identified radiographically. There is a noncemented neck aspirin on x-ray. Plan at this time injection 10  Milligrams Kenalog in 20 mg Xylocaine plain infiltrated to the fifth MTP joint area. Intra-articular and periarticular injections delivered to the right fifth toe joint. Patient tolerated the injections instructions for ice packs and recommended Tylenol or Aleve as needed for pain tube foam padding is also dispensed for use at this time. Also discussed the use of  proper shoes a straight last or out flare or wide lasted shoe would be beneficial. Patient will followup with shoe recommendations suggested a 1-2 month followup of symptoms persist or recur.  Alvan Dame DPM

## 2013-04-10 NOTE — Patient Instructions (Signed)

## 2013-04-28 ENCOUNTER — Other Ambulatory Visit: Payer: Self-pay | Admitting: Pulmonary Disease

## 2013-07-09 ENCOUNTER — Telehealth: Payer: Self-pay | Admitting: Gastroenterology

## 2013-07-09 NOTE — Telephone Encounter (Signed)
Osvaldo Angst CRNA took the pts information and states that billing will be in touch with them regarding the bill.

## 2013-07-09 NOTE — Telephone Encounter (Signed)
Left message for Osvaldo Angst CRNA to call back regarding anesthesia charges. Spoke with pts wife and let her know that I am waiting to hear back from him and will call her when I speak with him.

## 2013-07-19 ENCOUNTER — Telehealth: Payer: Self-pay | Admitting: Gastroenterology

## 2013-07-19 NOTE — Telephone Encounter (Signed)
Wife called and had questions about the CRNA/propofol bill. Message sent to Osvaldo Angst CRNA to have either he or his office contact the pts wife.

## 2013-08-28 ENCOUNTER — Telehealth: Payer: Self-pay | Admitting: Pulmonary Disease

## 2013-08-28 DIAGNOSIS — D126 Benign neoplasm of colon, unspecified: Secondary | ICD-10-CM

## 2013-08-28 DIAGNOSIS — N320 Bladder-neck obstruction: Secondary | ICD-10-CM

## 2013-08-28 DIAGNOSIS — F411 Generalized anxiety disorder: Secondary | ICD-10-CM

## 2013-08-28 DIAGNOSIS — E785 Hyperlipidemia, unspecified: Secondary | ICD-10-CM

## 2013-08-28 NOTE — Telephone Encounter (Signed)
Pt last seen 08/25/12. Has pending appt 08/31/13. Please advise if pt needs labs done? thanks

## 2013-08-28 NOTE — Telephone Encounter (Signed)
Pt is aware. Nothing further needed 

## 2013-08-28 NOTE — Telephone Encounter (Signed)
Per SN---  Ok to come in for fasting labs.  Orders have been placed for the pt.  thanks

## 2013-08-30 ENCOUNTER — Other Ambulatory Visit (INDEPENDENT_AMBULATORY_CARE_PROVIDER_SITE_OTHER): Payer: 59

## 2013-08-30 DIAGNOSIS — N320 Bladder-neck obstruction: Secondary | ICD-10-CM

## 2013-08-30 DIAGNOSIS — E785 Hyperlipidemia, unspecified: Secondary | ICD-10-CM

## 2013-08-30 DIAGNOSIS — D126 Benign neoplasm of colon, unspecified: Secondary | ICD-10-CM

## 2013-08-30 DIAGNOSIS — F411 Generalized anxiety disorder: Secondary | ICD-10-CM

## 2013-08-30 LAB — BASIC METABOLIC PANEL
BUN: 15 mg/dL (ref 6–23)
CO2: 27 mEq/L (ref 19–32)
Calcium: 9 mg/dL (ref 8.4–10.5)
Chloride: 106 mEq/L (ref 96–112)
Creatinine, Ser: 0.9 mg/dL (ref 0.4–1.5)
GFR: 95.25 mL/min (ref >60.00)
Glucose, Bld: 99 mg/dL (ref 70–99)
Potassium: 4 mEq/L (ref 3.5–5.1)
Sodium: 142 mEq/L (ref 135–145)

## 2013-08-30 LAB — LIPID PANEL
Cholesterol: 216 mg/dL — ABNORMAL HIGH (ref 0–200)
HDL: 55.7 mg/dL (ref >39.00)
LDL Cholesterol: 152 mg/dL — ABNORMAL HIGH (ref 0–99)
Total CHOL/HDL Ratio: 4
Triglycerides: 44 mg/dL (ref 0.0–149.0)
VLDL: 8.8 mg/dL (ref 0.0–40.0)

## 2013-08-30 LAB — CBC WITH DIFFERENTIAL/PLATELET
Basophils Absolute: 0 10*3/uL (ref 0.0–0.1)
Basophils Relative: 0.6 % (ref 0.0–3.0)
Eosinophils Absolute: 0.5 10*3/uL (ref 0.0–0.7)
Eosinophils Relative: 11.6 % — ABNORMAL HIGH (ref 0.0–5.0)
HCT: 45.3 % (ref 39.0–52.0)
Hemoglobin: 15.3 g/dL (ref 13.0–17.0)
Lymphocytes Relative: 40.7 % (ref 12.0–46.0)
Lymphs Abs: 1.7 10*3/uL (ref 0.7–4.0)
MCHC: 33.8 g/dL (ref 30.0–36.0)
MCV: 94.5 fl (ref 78.0–100.0)
Monocytes Absolute: 0.3 10*3/uL (ref 0.1–1.0)
Monocytes Relative: 7.5 % (ref 3.0–12.0)
Neutro Abs: 1.7 10*3/uL (ref 1.4–7.7)
Neutrophils Relative %: 39.6 % — ABNORMAL LOW (ref 43.0–77.0)
Platelets: 205 10*3/uL (ref 150.0–400.0)
RBC: 4.79 Mil/uL (ref 4.22–5.81)
RDW: 13.1 % (ref 11.5–14.6)
WBC: 4.3 10*3/uL — ABNORMAL LOW (ref 4.5–10.5)

## 2013-08-30 LAB — HEPATIC FUNCTION PANEL
ALT: 25 U/L (ref 0–53)
AST: 23 U/L (ref 0–37)
Albumin: 4.3 g/dL (ref 3.5–5.2)
Alkaline Phosphatase: 44 U/L (ref 39–117)
Bilirubin, Direct: 0.1 mg/dL (ref 0.0–0.3)
Total Bilirubin: 0.9 mg/dL (ref 0.3–1.2)
Total Protein: 6.5 g/dL (ref 6.0–8.3)

## 2013-08-30 LAB — PSA: PSA: 1.05 ng/mL (ref 0.10–4.00)

## 2013-08-30 LAB — TSH: TSH: 1.72 u[IU]/mL (ref 0.35–5.50)

## 2013-08-31 ENCOUNTER — Ambulatory Visit (INDEPENDENT_AMBULATORY_CARE_PROVIDER_SITE_OTHER): Payer: 59 | Admitting: Pulmonary Disease

## 2013-08-31 ENCOUNTER — Ambulatory Visit (INDEPENDENT_AMBULATORY_CARE_PROVIDER_SITE_OTHER)
Admission: RE | Admit: 2013-08-31 | Discharge: 2013-08-31 | Disposition: A | Discharge status: Home / Self Care | Payer: 59 | Source: Ambulatory Visit | Attending: Pulmonary Disease | Admitting: Pulmonary Disease

## 2013-08-31 ENCOUNTER — Encounter: Payer: Self-pay | Admitting: Pulmonary Disease

## 2013-08-31 VITALS — BP 110/78 | HR 105 | Temp 98.8°F | Ht 69.0 in | Wt 156.3 lb

## 2013-08-31 DIAGNOSIS — D126 Benign neoplasm of colon, unspecified: Secondary | ICD-10-CM

## 2013-08-31 DIAGNOSIS — IMO0001 Reserved for inherently not codable concepts without codable children: Secondary | ICD-10-CM

## 2013-08-31 DIAGNOSIS — K219 Gastro-esophageal reflux disease without esophagitis: Secondary | ICD-10-CM

## 2013-08-31 DIAGNOSIS — M545 Low back pain, unspecified: Secondary | ICD-10-CM

## 2013-08-31 DIAGNOSIS — G4700 Insomnia, unspecified: Secondary | ICD-10-CM

## 2013-08-31 DIAGNOSIS — M1990 Unspecified osteoarthritis, unspecified site: Secondary | ICD-10-CM

## 2013-08-31 DIAGNOSIS — Z0000 Encounter for general adult medical examination without abnormal findings: Secondary | ICD-10-CM

## 2013-08-31 DIAGNOSIS — G2581 Restless legs syndrome: Secondary | ICD-10-CM

## 2013-08-31 DIAGNOSIS — R51 Headache: Secondary | ICD-10-CM

## 2013-08-31 DIAGNOSIS — E785 Hyperlipidemia, unspecified: Secondary | ICD-10-CM

## 2013-08-31 NOTE — Progress Notes (Signed)
Subjective:    Patient ID: Randy Moran, male    DOB: 1961-05-27, 53 y.o.   MRN: [id number]  HPI 53 y/o WM here for a follow up visit and CPX...  he has multiple medical problems as noted below...    ~  August 13, 2011:  CPX & 1mo ROV> his CC is Insomnia & tired all the time w/ decr energy etc;  He denies snoring but has lots of movement according to wife (prev on Mirapex per DrHagan) & no reported apneas==> ESS is 14/24 & we discussed need for sleep study (to be scheduled at his convenience);  He also notes a bulge in right groin & exam confirms recurrent RIH==> refer to [facility] for elective repair;  See prob list below >>  CXR 3/13 showed normal heart size, clear lungs, NAD.Marland KitchenMarland Kitchen  EKG 3/13 showed NSR, rate82, WNL, NAD...  LABS 12/12:  Chems- wnl;  CBC- wnl;  TSH=1.21  LABS 3/13:  FLP- ok x TChol/LDL not at goals;  PSA=1.18;  Testos=625;  UA- clear  ADDENDUM 10/13 >> he finally had sleep study >> AHI=3, worse supine, mod snoring, some limb movements w/ sleep disruption; asked to get off his back at night, & he endorses PLMS w/ disturbance to wife etc; therefore trial MIRAPEX 0.5mg => incr to 1mg  as needed...  ~  August 25, 2012:  Yearly ROV & CPX> Randy Moran has had a good yr overall- he had bilat laparoscopic inguinal hernia repairs w/ mesh 4/13 by Kennyth Lose, some pain post op then back to baseline & dong well; He saw DrHagen for f/u HAs- on Relpax now; He is due for f/u colonoscopy & we will refer.... We reviewed the following medical problems during today's office visit >>     Glaucoma> on eye drops from DrCashwell; he reports good control on Rx...    Hyperlipid> on diet alone, intol to statins in past; FLP shows TChol 207, TG 68, HDL 60, LDL 135 and he needs better diet or consider med Rx...    GI- GERD, Polyps> on OTC Prilosec as needed; last colon was 4/09 by Demetra Shiner w/ divertics & 67mm hyperplastic polyp; +FamHx colon ca in mother noted...    DJD- neck pain, LBP, myofascial pain> on Tylenol,  Ibuprofen, etc; Dr Joretta Bachelor has given him botox injections...    HAs, migraines> on Relpax per Neuro- DrHagen; he saw DrHagen 3/13 for severe HA, given Botox & Alsuma for injection, now on Relpax...    RLS> on Mirapex0.5-2Qhs; he has seen DrHagen for this as well (she lists 0.125mg -3Qhs) but he says the Mirapex causes sinus congestion & HA (INTOL); we discussed switch to Requip0.5mg -1=>2Qhs...    Anxiety, Insomnia> not currently on meds... We reviewed prob list, meds, xrays and labs> see below for updates >>  He had the 2013 flu vaccine 9/13; meds refilled today...  LABS 3/14:  FLP- not quite at goals on diet alone;  Chems- wnl;  CBC- wnl;  TSH=1.37;  PSA=1.40;  Testos=432:  UA- clear...  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    Glaucoma> on eye drops from [place]; he reports good control on Rx...    Hx AB> never smoker w/ hx AB axac in past but none for several yrs now...    Hyperlipid> on diet alone, intol to statins in past; FLP 3/15 shows TChol 216, TG 44, HDL 56, LDL 152 and he needs better diet or consider med Rx, discussed alternative to  statins...    GI- GERD, Polyps> on OTC Prilosec as needed; last colon was 10/14 by DrKaplan w/ divertics & int hems; +FamHx colon ca in mother noted and he suggested f/u in 64yrs...    DJD- neck pain, LBP, myofascial pain> on Tylenol, Ibuprofen, etc; Dr Joretta Bachelor has given him botox injections...    HAs, migraines> on Relpax40 per Neuro- DrHagen; he saw DrHagen 3/13 for severe HA, given Botox & Alsuma for injection, now on Relpax40 & Zofran8 prn; he notes that insurance won't pay for Botox shots.    RLS> on Mirapex0.5Qhs; he has seen DrHagen for this as well but he says the Mirapex causes sinus congestion & HA (INTOL); we discussed switch to Requip0.5mg -1=>2Qhs...    Anxiety, Insomnia> not currently on meds... We reviewed prob list, meds, xrays and labs> see below for updates >>   CXR 3/15 showed norm heart size, clear lungs, NAD.Marland KitchenMarland Kitchen  EKG 3/15 showed NSR, rate68, WNL, NAD...  LABS 3/15:  FLP- not at goals on diet alone w/ LDL=152;  Chems- wnl;  CBC- wnl;  TSH=1.72;  PSA=1.05...           Problem List:      GLAUCOMA >> on Xalatan eye drops from DrCashwell w/ good control according to the pt... ~  3/15: now followed by DrTanner at [facility]...  Hx of ASTHMATIC BRONCHITIS, ACUTE (ICD-466.0) - AB exac in 2009 w/ Biaxin, Prednisone, Tussionex... finally resolved and doesn't require regular inhalers so far... he also had a sinusitis & dizzy eval by [facility] w/ CT Sinuses showing clear x sm amt of fluid in sphenoid sinus- he states all symptoms resolved w/ the Pred Rx... ~  3/13: He denies breathing difficulties, cough, phlegm, CP, palpit, SOB, edema, etc... ~  CXR 3/13 showed normal heart size, clear lungs, NAD... ~  3/14 & 3/15:  Doing well- no resp problems or infectious exac over the last >42yrs...  HYPERLIPIDEMIA (ICD-272.4) - prev on Simvastatin 20mg /d, but pt stopped due to "feeling bad" & now on diet alone... ~  [place] 4/07 showed TChol 238, TG 64, HDL 56, LDL 171... rec> try Simva20... pt  dc'd on his own. ~  [place] 8/08 showed TChol 230, TG 65, HDL 52, LDL 176... rec> vigorous diet efforts, refuses med. ~  [place] 11/08 showed TChol 173, TG 61, HDL 55, LDL 106... fantastic job w/ diet, add exercise. ~  [place] 10/09 showed TChol 176, TG 50, HDL 60, LDL 106 ~  FLP 11/10 showed TChol 238, TG 52, HDL 61, LDL 170... what happened? he still prefers diet Rx. ~  FLP 2/12 showed TChol 221, TG 49, HDL 64,  LDL 141... discussed diet + exercise, he refuses meds. ~  [place] 3/13 on diet alone showed TChol 208, TG 94, HDL 57, LDL 128... Sl improved, continue diet efforts. ~  FLP 3/14 on diet alone showed TChol 207, TG 68, HDL 60, LDL 135... Needs better diet if he is to avoid meds. ~  FLP 3/15 on diet alone showed TChol 216, TG 44, HDL 56, LDL 152... We reviewed low chol/ low fat diet...  GERD (ICD-530.81) - on PRILOSEC 20mg /d Prn... followed by Demetra Shiner for GI... he was in a GI drug study of reflux & the study ended... last EGD was 9/08 showing esophagitis... he uses PPI meds just as needed he says...  COLONIC POLYPS (ICD-211.3) - colonoscopy 4/09 by Demetra Shiner showed divertics & 34mm polyp= hyperplastic... f/u planned ?10 yrs (there is a + FamHx of colon cancer in his mother). ~  3/14: on OTC Prilosec as needed; last colon was 4/09 by Demetra Shiner w/ divertics & 82mm hyperplastic polyp; +FamHx colon ca in mother noted. ~  10/14:  He had 62yr f/u colonoscopy by DrKaplan=> neg x for int hems; he suggested continued 25yr f/u colons due to mother's hx of colon ca...  INGUINAL HERNIA >> 3/13 he noted lump in right groin c/e RIH x several months;  He has hx of Bilat Inguinal Hernias repaired in the 1960s==> we will refer to [facility] for surgical consideration. ~  4/13:  He had bilat laparoscopic inguinal hernia repairs w/ mesh by DrRosenbower...  ? of PROSTATITIS NOS (ICD-601.9) - treated by [facility] in 1988 for prostatitis... pt had f/u w/ Urology in 2010 and everything was fine. ~  Labs 3/13 showed PSA= 1.18, and Testos=  625... ~  Labs 3/14 showed PSA= 1.40, and Testos= 432... ~  Labs 3/15 showed PSA= 1.05  DEGENERATIVE JOINT DISEASE (ICD-715.90) - prev evals by Drs Kari Baars and Kenner w/ surgery to both shoulders, knee x2, and wrist... he has also seen a chiropractor- DrWillen, w/ CSpine problems; & now [employer]... ~  3/13: He tells me that DrNorris is coordinating his Ortho care & worst prob is his shoulders w/ 4 ops- 3 right & 1 left, and still he has difficulty; using Advil/ Tylenol/ PT...  NECK PAIN LOW BACK PAIN (ICD-724.2) MYOFASCIAL PAIN SYNDROME (ICD-729.1) - he has seen DrZeminski in the past for ? fibromyalgia and lumbar DDD... ~  7/12: He saw Chiropractor Alcide Evener for neck/ back pain & XRays in EPIC revealed> CSpine- straightened alignment with degenerative disc disease at C6-7; L/SSpine- norm alignment, norm disc spaces; Pelvis- normal... ~  3/14:  on Tylenol, Ibuprofen, etc; Dr Joretta Bachelor has given him botox injections... ~  3/15: on Tylenol, Ibuprofen, Tramadol has helped; insurance will no longer pay for Botox injections for HA & neck pains...  HEADACHE (ICD-784.0) - [facility]> felt to be migraine headaches and treated w/ mult meds over the yrs includingTopamax, Baclofen, RELPAX, Tylenol & Ibuprofen/ Diclofenac; he reminds me that he is INTOLERANT to Vicodin, Tramadol, Prednisone...  ~  Note: also saw DrStiefel in 1983 for severe incapacitating headaches... ~  Hosp 10/10 w/ altered mental status prob related to meds + Vicodin given for shoulder surg> resolved off meds & f/u w/ DrHagen to re-assess his migraines> just using Prn Relax now. ~  12/12: severe HA & no relief from his Relpax & Chlorpromazine from [place] (prev tried Botox as well);  f/u w/ Neuro & treated w/ trigger point injection +  Diclofenac & more Botox planned. ~  3/14:  on Relpax per Neuro- DrHagen; he saw [facility] 3/13 for severe HA, given Botox & Alsuma for  injection, now on Relpax.  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 ~  EEG 10/10 was normal/ neg... ~  MRI/ MRA Brain 10/10 showed NAD, scattered subcortical T2 hyperintensities are sl greater in number than expected for age, MRA= normal.  ~  CT CSpine 10/10 shows DDD & spondylosis lower CSpine... ~  CDopplers 10/10 were WNL... ~  2DEcho 10/10 was normal w/ EF= 60-65% & no wall motion abn...  ANXIETY & INSOMNIA >>  PHYSICAL EXAMINATION (ICD-V70.0) - he takes ASA 81mg /d... ~  Colon: mother had colon cancer- colonoscopy 10/14 by DrKaplan was neg x int hems...Marland KitchenMarland KitchenMarland Kitchen ~  Immunizations:  he gets the yearly Flu vaccines;  and had TDAP 11/10...   Past Surgical History  Procedure Laterality Date  . Bilateral inguinal herniorrhaphy      1960's  . Appendectomy  1967  . Knee surgery x 2  2004,2008  . Bilateral shoulder surgery  2006, 2007,2010  . Ganglion cyst surgery  2009  . Hernia repair  09/30/11    bilateral inguinal hernia    Outpatient Encounter Prescriptions as of 08/31/2013  Medication Sig  . acetaminophen (TYLENOL) 500 MG tablet Take 500 mg by mouth every 6 (six) hours as needed.  . eletriptan (RELPAX) 40 MG tablet One tablet by mouth at onset of headache. May repeat in 2 hours if headache persists or recurs. may repeat in 2 hours if necessary  . latanoprost (XALATAN) 0.005 % ophthalmic solution As needed  . naproxen sodium (ANAPROX) 220 MG tablet Take 220 mg by mouth 2 (two) times daily as needed.  . ondansetron (ZOFRAN) 8 MG tablet Take by mouth every 8 (eight) hours as needed for nausea.  . pramipexole (MIRAPEX) 0.5 MG tablet TAKE 1 TABLET AT BEDTIME FOR 2 WEEKS,THEN TAKE 2 TABS AT BEDTIME.    Allergies  Allergen Reactions  . Levofloxacin     REACTION: rash  . Prednisone     pred pak makes the pt very agitated--pt can tolerate the depo medrol injection  . Sulfamethoxazole-Trimethoprim      REACTION: rash  . Adhesive [Tape] Itching and Rash    Current Medications, Allergies, Past Medical History, Past Surgical History, Family History, and Social History were reviewed in [employer] record.    Review of Systems        The patient complains of malaise, nasal congestion, dyspnea on exertion, indigestion/heartburn, paresthesias, and anxiety.  The patient denies fever, chills, sweats, anorexia, fatigue, weakness, weight loss, sleep disorder, blurring, diplopia, eye irritation, eye discharge, vision loss, eye pain, photophobia, earache, ear discharge, tinnitus, decreased hearing, nosebleeds, sore throat, hoarseness, chest pain, palpitations, syncope, orthopnea, PND, peripheral edema, cough, dyspnea at rest, excessive sputum, hemoptysis, wheezing, pleurisy, nausea, vomiting, diarrhea, constipation, change in bowel habits, abdominal pain, melena, hematochezia, jaundice, gas/bloating, dysphagia, odynophagia, dysuria, hematuria, urinary frequency, urinary hesitancy, nocturia, incontinence, back pain, joint pain, joint swelling, muscle cramps, muscle weakness, stiffness, arthritis, sciatica, restless legs, leg pain at night, leg pain with exertion, rash, itching, dryness, suspicious lesions, paralysis, seizures, tremors, vertigo, transient blindness, frequent falls, frequent headaches, difficulty walking, depression, memory loss, confusion, cold intolerance, heat intolerance, polydipsia, polyphagia, polyuria, unusual weight change, abnormal bruising, bleeding, enlarged lymph nodes, urticaria, allergic rash, hay fever, and recurrent infections.  Objective:   Physical Exam     WD, WN, 53 y/o WM in NAD... GENERAL:  Alert & oriented; pleasant & cooperative... HEENT:  [place]/AT, EOM-wnl, PERRLA, Fundi-benign, EACs-clear, TMs-wnl, NOSE-clear, THROAT-clear & wnl. NECK:  Supple w/ fairROM; no JVD; normal carotid impulses w/o bruits; no thyromegaly or nodules palpated; no  lymphadenopathy. CHEST:  Clear to P & A; without wheezes/ rales/ or rhonchi. HEART:  Regular Rhythm; without murmurs/ rubs/ or gallops. ABDOMEN:  Soft & nontender; normal bowel sounds; no organomegaly or masses detected. s/p bilat inguinal hernia repairs... RECTAL:  Neg - prostate 2+ & nontender w/o nodules; stool hematest neg. EXT: without deformities, mild arthritic changes; +trigger points; no varicose veins/ venous insuffic/ or edema. scars of shoulder surgeries & some decr ROM... NEURO:  CN's intact; motor testing normal; sensory testing normal; gait normal & balance OK. DERM:  No lesions noted; no rash etc...  RADIOLOGY DATA:  Reviewed in the EPIC EMR & discussed w/ the patient...  LABORATORY DATA:  Reviewed in the EPIC EMR & discussed w/ the patient...   Assessment & Plan:    CPX>>   Hx AB>  No recent exac & he is not on any regular breathing meds...  Hyperlipid>  On diet alone as he was intol to low dose Simva in past & refused other statins or [facility]id Clinic; he's doing the best he can on diet alone...  GI> GERD, Polyps>  Prev on Protonix, he stopped on his own; reminded to use OTC Prilosec as needed for symptoms...  DJD, LBP, ?FM>  He has seen DrZ in the past,?FM, known lumbar DDD, he has also seen Chiropractors for XRays and adjustments; prev eval CSpine by DrRamos w/ shot... We will refer back to rheum for their eval...  Hx Severe Migraine HAs>  These have been present x yrs and managed by DrHagen [facility] clinic; she has tried Botox etc; currently taking Relpax & Zofran...  Anxiety>  Not currently on anxiotytic rx...   Patient's Medications  New Prescriptions   No medications on file  Previous Medications   ACETAMINOPHEN (TYLENOL) 500 MG TABLET    Take 500 mg by mouth every 6 (six) hours as needed.   ELETRIPTAN (RELPAX) 40 MG TABLET    One tablet by mouth at onset of headache. May repeat in 2 hours if headache persists or recurs. may repeat in 2 hours if necessary    LATANOPROST (XALATAN) 0.005 % OPHTHALMIC SOLUTION    As needed   NAPROXEN SODIUM (ANAPROX) 220 MG TABLET    Take 220 mg by mouth 2 (two) times daily as needed.   ONDANSETRON (ZOFRAN) 8 MG TABLET    Take by mouth every 8 (eight) hours as needed for nausea.   PRAMIPEXOLE (MIRAPEX) 0.5 MG TABLET    TAKE 1 TABLET AT BEDTIME FOR 2 WEEKS,THEN TAKE 2 TABS AT BEDTIME.  Modified Medications   No medications on file  Discontinued Medications   No medications on file

## 2013-08-31 NOTE — Patient Instructions (Signed)
Today we updated your med list in our EPIC system...    Continue your current medications the same...  Today we reviewed your recent fasting blood work & gave you a copy for your records... We also did a follow up CXR &EKG...    We will contact you w/ the results when available...   We will arrange for a Rheumatology follow up w/ DrZ's partners...  Call for any questions or if we can be of service in any way.Marland KitchenMarland Kitchen

## 2013-09-28 ENCOUNTER — Encounter: Payer: Self-pay | Admitting: Family Medicine

## 2013-09-28 ENCOUNTER — Ambulatory Visit (INDEPENDENT_AMBULATORY_CARE_PROVIDER_SITE_OTHER): Payer: 59 | Admitting: Family Medicine

## 2013-09-28 VITALS — BP 108/80 | Temp 98.7°F | Ht 68.5 in | Wt 152.0 lb

## 2013-09-28 DIAGNOSIS — G43909 Migraine, unspecified, not intractable, without status migrainosus: Secondary | ICD-10-CM

## 2013-09-28 DIAGNOSIS — H409 Unspecified glaucoma: Secondary | ICD-10-CM | POA: Insufficient documentation

## 2013-09-28 DIAGNOSIS — M1990 Unspecified osteoarthritis, unspecified site: Secondary | ICD-10-CM

## 2013-09-28 DIAGNOSIS — E785 Hyperlipidemia, unspecified: Secondary | ICD-10-CM

## 2013-09-28 DIAGNOSIS — Z7189 Other specified counseling: Secondary | ICD-10-CM

## 2013-09-28 DIAGNOSIS — Z7689 Persons encountering health services in other specified circumstances: Secondary | ICD-10-CM

## 2013-09-28 NOTE — Patient Instructions (Signed)
-  PLEASE SIGN UP FOR MYCHART TODAY   We recommend the following healthy lifestyle measures: - eat a healthy diet consisting of lots of vegetables, fruits, beans, nuts, seeds, healthy meats such as white chicken and fish and whole grains.  - avoid fried foods, fast food, processed foods, sodas, red meet and other fattening foods.  - get a least 150 minutes of aerobic exercise per week.   Follow up in: 1 year or as needed

## 2013-09-28 NOTE — Progress Notes (Signed)
Pre visit review using our clinic review tool, if applicable. No additional management support is needed unless otherwise documented below in the visit note. 

## 2013-09-28 NOTE — Progress Notes (Signed)
Chief Complaint  Patient presents with  . Establish Care  . rheumatology    HPI:  Randy Moran is here to establish care.  Last PCP and physical:  Has the following chronic problems and concerns today:  Patient Active Problem List   Diagnosis Date Noted  . Migraine - managed by Dr. Irwin Brakeman Neurology 09/28/2013  . Glaucoma - managed by [facility] 09/28/2013  . DEGENERATIVE JOINT DISEASE - Managed by Dr. Nelva Bush at [place] 04/07/2008  . HYPERLIPIDEMIA 03/22/2007  . LOW BACK PAIN 03/22/2007   Chronic Fatigue/chronic stiffness and hands/frequent chronic polyarthralgia: -mild, chronic, annoying -reports saw rheum in the past with ? RA - but inconclusive -reports Dr. Lenna Gilford wanted him to see Rheum again -issues not really bothering him much, but wants to see Rheum due to Dr. Jeannine Kitten recommendation - has appointment with Dr. Marijean Bravo -just wanted [place] to know -doesn't sleep much due to job - on call with [employer] every 3 weeks  Health Maintenance: -had physical 2 weeks ago  ROS: See pertinent positives and negatives per HPI.  Past Medical History  Diagnosis Date  . Hyperlipidemia   . GERD (gastroesophageal reflux disease)   . Hx of colonic polyps   . Prostatitis   . DJD (degenerative joint disease)   . Lower back pain   . Myofascial pain syndrome   . Anxiety   . Insomnia   . Glaucoma left eye  . Migraines   . Restless leg syndrome 08/25/2012  . ANXIETY 03/22/2007    Qualifier: Diagnosis of  By: Jenny Reichmann MD, Hunt Oris     Family History  Problem Relation Age of Onset  . Coronary artery disease    . Hyperlipidemia    . Hypertension    . Cancer Mother     colon  . Colon cancer Mother   . Cancer Maternal Aunt     breast  . Cancer Maternal Uncle     prostate  . Colon cancer Maternal Uncle   . Esophageal cancer Neg Hx   . Rectal cancer Neg Hx   . Stomach cancer Neg Hx     History   Social History  . Marital Status: Married    Spouse Name:  kimberley    Number of Children: N/A  . Years of Education: N/A   Occupational History  . [employer]    Social History Main Topics  . Smoking status: Never Smoker   . Smokeless tobacco: Never Used  . Alcohol Use: No  . Drug Use: No  . Sexual Activity: None   Other Topics Concern  . None   Social History Narrative   Work or School: [employer] - on call every few weeks      Home Situation: lives with wife      Spiritual Beliefs: Darrick Meigs, [place]      Lifestyle: walks 6 days per week; diet is fair                Current outpatient prescriptions:acetaminophen (TYLENOL) 500 MG tablet, Take 500 mg by mouth every 6 (six) hours as needed., Disp: , Rfl: ;  eletriptan (RELPAX) 40 MG tablet, One tablet by mouth at onset of headache. May repeat in 2 hours if headache persists or recurs. may repeat in 2 hours if necessary, Disp: , Rfl: ;  latanoprost (XALATAN) 0.005 % ophthalmic solution, As needed, Disp: , Rfl:  naproxen sodium (ANAPROX) 220 MG tablet, Take 220 mg by mouth 2 (two) times daily  as needed., Disp: , Rfl: ;  ondansetron (ZOFRAN-ODT) 8 MG disintegrating tablet, , Disp: , Rfl: ;  timolol (TIMOPTIC-XR) 0.5 % ophthalmic gel-forming, , Disp: , Rfl: ;  [DISCONTINUED] chlorproMAZINE (THORAZINE) 10 MG tablet, As needed for migraine headache , Disp: , Rfl:   EXAM:  Filed Vitals:   09/28/13 0816  BP: 108/80  Temp: 98.7 F (37.1 C)    Body mass index is 22.77 kg/(m^2).  GENERAL: vitals reviewed and listed above, alert, oriented, appears well hydrated and in no acute distress  HEENT: atraumatic, conjunttiva clear, no obvious abnormalities on inspection of external nose and ears  NECK: no obvious masses on inspection  LUNGS: clear to auscultation bilaterally, no wheezes, rales or rhonchi, good air movement  CV: HRRR, no peripheral edema  MS: moves all extremities without noticeable abnormality  PSYCH: pleasant and cooperative, no obvious depression or  anxiety  ASSESSMENT AND PLAN:  Discussed the following assessment and plan:  Migraine - managed by Dr. Irwin Brakeman Neurology  Glaucoma - managed by Coralee Rud  Encounter to establish care  DEGENERATIVE JOINT DISEASE - Managed by Dr. [facility]elva [facility] at [facility]   -We reviewed the PMH, [place], FH, SH, Meds and Allergies. -We provided refills for any medications we will prescribe as needed. -We addressed current concerns per orders and patient instructions. -We have asked for records for pertinent exams, studies, vaccines and notes from previous providers. -We have advised patient to follow up per instructions below. -reviewed his recent labs with him  -Patient advised to return or notify a doctor immediately if symptoms worsen or persist or new concerns arise.  Patient Instructions  -PLEASE SIGN UP FOR MYCHART TODAY   We recommend the following healthy lifestyle measures: - eat a healthy diet consisting of lots of vegetables, fruits, beans, nuts, seeds, healthy meats such as white chicken and fish and whole grains.  - avoid fried foods, fast food, processed foods, sodas, red meet and other fattening foods.  - get a least 150 minutes of aerobic exercise per week.   Follow up in: 1 year or as needed      Lucretia Kern

## 2013-10-19 ENCOUNTER — Ambulatory Visit: Payer: 59 | Admitting: Physician Assistant

## 2013-10-26 ENCOUNTER — Ambulatory Visit: Payer: 59 | Admitting: Physician Assistant

## 2014-03-15 ENCOUNTER — Ambulatory Visit (INDEPENDENT_AMBULATORY_CARE_PROVIDER_SITE_OTHER): Payer: 59 | Admitting: Family Medicine

## 2014-03-15 ENCOUNTER — Encounter: Payer: Self-pay | Admitting: Family Medicine

## 2014-03-15 VITALS — BP 118/84 | HR 94 | Temp 98.3°F | Ht 68.5 in | Wt 160.0 lb

## 2014-03-15 DIAGNOSIS — R519 Headache, unspecified: Secondary | ICD-10-CM

## 2014-03-15 DIAGNOSIS — G8929 Other chronic pain: Secondary | ICD-10-CM

## 2014-03-15 DIAGNOSIS — R51 Headache: Secondary | ICD-10-CM

## 2014-03-15 DIAGNOSIS — F411 Generalized anxiety disorder: Secondary | ICD-10-CM

## 2014-03-15 DIAGNOSIS — G894 Chronic pain syndrome: Secondary | ICD-10-CM

## 2014-03-15 DIAGNOSIS — J320 Chronic maxillary sinusitis: Secondary | ICD-10-CM

## 2014-03-15 MED ORDER — DOXYCYCLINE HYCLATE 100 MG PO CAPS
100.0000 mg | ORAL_CAPSULE | Freq: Two times a day (BID) | ORAL | Status: DC
Start: 2014-03-15 — End: 2014-06-18

## 2014-03-15 MED ORDER — PAROXETINE HCL 20 MG PO TABS: 20.0000 mg | ORAL_TABLET | Freq: Every day | ORAL | Status: DC

## 2014-03-15 NOTE — Progress Notes (Signed)
No chief complaint on file.   HPI:  Acute visit for:  1)Sinus congestion: -started a month or so ago -symptoms: sinus congestion, PND, maxillary facial pain, yellow congestion, headaches - he sees neurologist for this -denies: fevers, tooth pain, ear pain, NVD  2)Poor focus" -PMH sig for migraines (seeing neurologist), anxiety, depression, myofacial pain syndrome, insomnia, chronic fatigue (seeing Dr. Marijean Bravo in rheumatology) - reports had extensive eval and told this is chronic osteoarthritis -he has a lot going on at work and is taking a bid exam and is having trouble sitting down and studying for this test -he reports last time he had sinus infection he had this -reports focus otherwise is great -denies depression -does admit to irritability at time and generalized anxiety -meds: eletriptan, chlorpromazine prn for migraines -denies: memory loss, change in headaches, fevers, SI, thoughts of self harm -felt better on prednisone this summer from rheumatologist Restlessness, on edge: always Easily Fatigued: yes Difficulty concentrating: yes Irritability: yes Muscle tension: yes Sleep Disturbance: yes For a long time    ROS: See pertinent positives and negatives per HPI.  Past Medical History  Diagnosis Date  . Hyperlipidemia   . GERD (gastroesophageal reflux disease)   . Hx of colonic polyps   . Prostatitis   . DJD (degenerative joint disease)   . Lower back pain   . Myofascial pain syndrome   . Anxiety   . Insomnia   . Glaucoma left eye  . Migraines   . Restless leg syndrome 08/25/2012  . ANXIETY 03/22/2007    Qualifier: Diagnosis of  By: Jenny Reichmann MD, Hunt Oris     Past Surgical History  Procedure Laterality Date  . Bilateral inguinal herniorrhaphy      1960's  . Appendectomy  1967  . Knee surgery x 2  2004,2008  . Bilateral shoulder surgery  2006, 2007,2010  . Ganglion cyst surgery  2009  . Hernia repair  09/30/11    bilateral inguinal hernia    Family History   Problem Relation Age of Onset  . Coronary artery disease    . Hyperlipidemia    . Hypertension    . Cancer Mother     colon  . Colon cancer Mother   . Cancer Maternal Aunt     breast  . Cancer Maternal Uncle     prostate  . Colon cancer Maternal Uncle   . Esophageal cancer Neg Hx   . Rectal cancer Neg Hx   . Stomach cancer Neg Hx     History   Social History  . Marital Status: Married    Spouse Name: kimberley    Number of Children: N/A  . Years of Education: N/A   Occupational History  . [employer]    Social History Main Topics  . Smoking status: Never Smoker   . Smokeless tobacco: Never Used  . Alcohol Use: No  . Drug Use: No  . Sexual Activity: None   Other Topics Concern  . None   Social History Narrative   Work or School: [employer] - on call every few weeks      Home Situation: lives with wife      Spiritual Beliefs: Darrick Meigs, [place]      Lifestyle: walks 6 days per week; diet is fair                Current outpatient prescriptions:acetaminophen (TYLENOL) 500 MG tablet, Take 500 mg by mouth every 6 (six) hours as needed., Disp: , Rfl: ;  eletriptan (RELPAX) 40 MG tablet, One tablet by mouth at onset of headache. May repeat in 2 hours if headache persists or recurs. may repeat in 2 hours if necessary, Disp: , Rfl: ;  latanoprost (XALATAN) 0.005 % ophthalmic solution, As needed, Disp: , Rfl:  naproxen sodium (ANAPROX) 220 MG tablet, Take 220 mg by mouth 2 (two) times daily as needed., Disp: , Rfl: ;  ondansetron (ZOFRAN-ODT) 8 MG disintegrating tablet, , Disp: , Rfl: ;  timolol (TIMOPTIC-XR) 0.5 % ophthalmic gel-forming, , Disp: , Rfl: ;  doxycycline (VIBRAMYCIN) 100 MG capsule, Take 1 capsule (100 mg total) by mouth 2 (two) times daily., Disp: 20 capsule, Rfl: 0 PARoxetine (PAXIL) 20 MG tablet, Take 1 tablet (20 mg total) by mouth daily., Disp: 30 tablet, Rfl: 3;  [DISCONTINUED] chlorproMAZINE (THORAZINE) 10 MG tablet, As needed for migraine headache ,  Disp: , Rfl:   EXAM:  Filed Vitals:   03/15/14 1338  BP: 118/84  Pulse: 94  Temp: 98.3 F (36.8 C)    Body mass index is 23.97 kg/(m^2).  GENERAL: vitals reviewed and listed above, alert, oriented, appears well hydrated and in no acute distress  HEENT: atraumatic, conjunttiva clear, no obvious abnormalities on inspection of external nose and ears, normal appearance of ear canals and TMs, white nasal congestion, mild post oropharyngeal erythema with PND, no tonsillar edema or exudate, max sinus TTP   NECK: no obvious masses on inspection  LUNGS: clear to auscultation bilaterally, no wheezes, rales or rhonchi, good air movement  CV: HRRR, no peripheral edema  MS: moves all extremities without noticeable abnormality  PSYCH: pleasant and cooperative,anxious  ASSESSMENT AND PLAN:  Discussed the following assessment and plan:  Chronic maxillary sinusitis - Plan: doxycycline (VIBRAMYCIN) 100 MG capsule  Generalized anxiety disorder - Plan: PARoxetine (PAXIL) 20 MG tablet  Chronic nonintractable headache, unspecified headache type  Chronic pain syndrome  -Patient advised to return or notify a doctor immediately if symptoms worsen or persist or new concerns arise.  Patient Instructions  -call today to schedule counseling for th anxiety and to help you study and focus  -start the Paxil and take daily - don't stop this medication suddenly -As we discussed, we have prescribed a new medication for you at this appointment. We discussed the common and serious potential adverse effects of this medication and you can review these and more with the pharmacist when you pick up your medication.  Please follow the instructions for use carefully and notify us immediately if you have any problems taking this medication.  -take the doxycycline to complete the course for the sinus issues  -also do 21 days of nasocort daily  -follow up with your rheumatologist and neurologist  -follow up  with me for the anxiety in 2 weeks     Nicholai Willette R.

## 2014-03-15 NOTE — Progress Notes (Signed)
Pre visit review using our clinic review tool, if applicable. No additional management support is needed unless otherwise documented below in the visit note. 

## 2014-03-15 NOTE — Patient Instructions (Signed)
-  call today to schedule counseling for th anxiety and to help you study and focus  -start the Paxil and take daily - don't stop this medication suddenly -As we discussed, we have prescribed a new medication for you at this appointment. We discussed the common and serious potential adverse effects of this medication and you can review these and more with the pharmacist when you pick up your medication.  Please follow the instructions for use carefully and notify us immediately if you have any problems taking this medication.  -take the doxycycline to complete the course for the sinus issues  -also do 21 days of nasocort daily  -follow up with your rheumatologist and neurologist  -follow up with me for the anxiety in 2 weeks

## 2014-03-20 ENCOUNTER — Telehealth: Payer: Self-pay | Admitting: Family Medicine

## 2014-03-20 MED ORDER — AMOXICILLIN-POT CLAVULANATE 875-125 MG PO TABS: 1.0000 | ORAL_TABLET | Freq: Two times a day (BID) | ORAL | Status: DC

## 2014-03-20 NOTE — Telephone Encounter (Signed)
Patient's wife states he cannot take Augmentin-causes diarrhea, requests a "mild" antibiotic.

## 2014-03-20 NOTE — Telephone Encounter (Signed)
Mrs Schipani stated the pt will try Augmentin as she cannot recall an antibiotic that has worked well before and this was sent to his pharmacy.  She was advised to tell the pt to take Paxil in the morning.

## 2014-03-20 NOTE — Telephone Encounter (Signed)
These are the best antibiotics for a sinus infection as there is bacterial resistance with many antibioitics. All antibiotics can cause upset stomach and GI issues.  Is there an antibioitc he has tolerated in the past? If not, we could give him a medication for the nausea.

## 2014-03-20 NOTE — Telephone Encounter (Signed)
Mrs Postlewait also wanted to know how the pt should take Paxil? AM or PM?

## 2014-03-20 NOTE — Telephone Encounter (Signed)
Pt wife called to say that the antibiotic he is taking for a sinus infection is making him really nausea. Pt would to know if something else can be rx for him    Pharmacy; [facility]

## 2014-03-20 NOTE — Telephone Encounter (Signed)
Could try augmentin 875 bid for 10 days.

## 2014-03-20 NOTE — Telephone Encounter (Signed)
paxil in am.

## 2014-04-05 ENCOUNTER — Ambulatory Visit: Payer: 59 | Admitting: Family Medicine

## 2014-04-12 ENCOUNTER — Ambulatory Visit: Payer: 59 | Admitting: Family Medicine

## 2014-04-17 ENCOUNTER — Telehealth: Payer: Self-pay | Admitting: Family Medicine

## 2014-04-17 NOTE — Telephone Encounter (Signed)
I called the pts wife and advised her per Dr Maudie Mercury the pt should not stop taking Paxil suddenly.  She stated he only took this medication for one week and only took 1/2.  She stated he feels his blood sugar is low, she checked it at home and it was 40-gave him juice and questioned if this was due to Paxil and almost passed out and felt like he could not talk and I advised he be seen at the ER immediately or call 911  if this happens again and she agreed.  She stated he will keep the appt for Friday.

## 2014-04-17 NOTE — Telephone Encounter (Signed)
Per pt wife pt stop taking the anxiety med due to side effects. Pt is sch to see dr Maudie Mercury on this Friday 04-19-14

## 2014-04-19 ENCOUNTER — Ambulatory Visit: Payer: 59 | Admitting: Family Medicine

## 2014-06-18 ENCOUNTER — Encounter: Payer: Self-pay | Admitting: Family Medicine

## 2014-06-18 ENCOUNTER — Ambulatory Visit (INDEPENDENT_AMBULATORY_CARE_PROVIDER_SITE_OTHER): Payer: 59 | Admitting: Family Medicine

## 2014-06-18 VITALS — BP 118/80 | HR 56 | Temp 98.2°F | Ht 68.5 in | Wt 159.2 lb

## 2014-06-18 DIAGNOSIS — F411 Generalized anxiety disorder: Secondary | ICD-10-CM

## 2014-06-18 DIAGNOSIS — E785 Hyperlipidemia, unspecified: Secondary | ICD-10-CM

## 2014-06-18 DIAGNOSIS — G43809 Other migraine, not intractable, without status migrainosus: Secondary | ICD-10-CM

## 2014-06-18 NOTE — Progress Notes (Signed)
Pre visit review using our clinic review tool, if applicable. No additional management support is needed unless otherwise documented below in the visit note. 

## 2014-06-18 NOTE — Progress Notes (Signed)
HPI:  Follow up Anxiety -hx chronic pain and fatigue, depression, anxiety followed by rheum and neurology -and advised counseling and did trial paxil last visit but he reports he felt it worsened his migraines -sometimes with his migraines does not eat and feels like sugar drops if had something very sugary then does not eat for awhile, reports he has always had this -reports passed the exam he was worried about and stress and worry are less now and is doing better -denies: SI, thought self harm  ROS: See pertinent positives and negatives per HPI.  Past Medical History  Diagnosis Date  . Hyperlipidemia   . GERD (gastroesophageal reflux disease)   . Hx of colonic polyps   . Prostatitis   . DJD (degenerative joint disease)   . Lower back pain   . Myofascial pain syndrome   . Anxiety   . Insomnia   . Glaucoma left eye  . Migraines   . Restless leg syndrome 08/25/2012  . ANXIETY 03/22/2007    Qualifier: Diagnosis of  By: Jenny Reichmann MD, Hunt Oris     Past Surgical History  Procedure Laterality Date  . Bilateral inguinal herniorrhaphy      1960's  . Appendectomy  1967  . Knee surgery x 2  2004,2008  . Bilateral shoulder surgery  2006, 2007,2010  . Ganglion cyst surgery  2009  . Hernia repair  09/30/11    bilateral inguinal hernia    Family History  Problem Relation Age of Onset  . Coronary artery disease    . Hyperlipidemia    . Hypertension    . Cancer Mother     colon  . Colon cancer Mother   . Cancer Maternal Aunt     breast  . Cancer Maternal Uncle     prostate  . Colon cancer Maternal Uncle   . Esophageal cancer Neg Hx   . Rectal cancer Neg Hx   . Stomach cancer Neg Hx     History   Social History  . Marital Status: Married    Spouse Name: kimberley    Number of Children: N/A  . Years of Education: N/A   Occupational History  . [employer]    Social History Main Topics  . Smoking status: Never Smoker   . Smokeless tobacco: Never Used  . Alcohol Use: No  .  Drug Use: No  . Sexual Activity: None   Other Topics Concern  . None   Social History Narrative   Work or School: [employer] - on call every few weeks      Home Situation: lives with wife      Spiritual Beliefs: Darrick Meigs, [place]      Lifestyle: walks 6 days per week; diet is fair                Current outpatient prescriptions: acetaminophen (TYLENOL) 500 MG tablet, Take 500 mg by mouth every 6 (six) hours as needed., Disp: , Rfl: ;  eletriptan (RELPAX) 40 MG tablet, One tablet by mouth at onset of headache. May repeat in 2 hours if headache persists or recurs. may repeat in 2 hours if necessary, Disp: , Rfl: ;  latanoprost (XALATAN) 0.005 % ophthalmic solution, As needed, Disp: , Rfl:  naproxen sodium (ANAPROX) 220 MG tablet, Take 220 mg by mouth 2 (two) times daily as needed., Disp: , Rfl: ;  ondansetron (ZOFRAN-ODT) 8 MG disintegrating tablet, , Disp: , Rfl: ;  timolol (TIMOPTIC-XR) 0.5 % ophthalmic gel-forming, , Disp: ,  Rfl: ;  [DISCONTINUED] chlorproMAZINE (THORAZINE) 10 MG tablet, As needed for migraine headache , Disp: , Rfl:   EXAM:  Filed Vitals:   06/18/14 0806  BP: 118/80  Pulse: 56  Temp: 98.2 F (36.8 C)    Body mass index is 23.85 kg/(m^2).  GENERAL: vitals reviewed and listed above, alert, oriented, appears well hydrated and in no acute distress  HEENT: atraumatic, conjunttiva clear, no obvious abnormalities on inspection of external nose and ears  NECK: no obvious masses on inspection  LUNGS: clear to auscultation bilaterally, no wheezes, rales or rhonchi, good air movement  CV: HRRR, no peripheral edema  MS: moves all extremities without noticeable abnormality  PSYCH: pleasant and cooperative, no obvious depression or anxiety  ASSESSMENT AND PLAN:  Discussed the following assessment and plan:  GAD (generalized anxiety disorder)  Hyperlipemia  Other migraine without status migrainosus, not intractable  -opted to hold off on any  medications for anxiety, improving now that exam passed -advised counseling -CPE with labs in March -Patient advised to return or notify a doctor immediately if symptoms worsen or persist or new concerns arise.  Patient Instructions  BEFORE YOU LEAVE: -schedule physical in March or April  -set up counseling  We recommend the following healthy lifestyle measures: -eat small regular healthy meals and avoid sweets if possible - eat a healthy diet consisting of lots of vegetables, fruits, beans, nuts, seeds, healthy meats such as white chicken and fish and whole grains.  - avoid fried foods, fast food, processed foods, sodas, red meet and other fattening foods.  - get a least 150 minutes of aerobic exercise per week.       Randy Moran R.

## 2014-06-18 NOTE — Patient Instructions (Addendum)
BEFORE YOU LEAVE: -schedule physical in March or April  -set up counseling  We recommend the following healthy lifestyle measures: -eat small regular healthy meals and avoid sweets if possible - eat a healthy diet consisting of lots of vegetables, fruits, beans, nuts, seeds, healthy meats such as white chicken and fish and whole grains.  - avoid fried foods, fast food, processed foods, sodas, red meet and other fattening foods.  - get a least 150 minutes of aerobic exercise per week.

## 2014-09-02 ENCOUNTER — Encounter: Payer: Self-pay | Admitting: Family Medicine

## 2014-09-04 ENCOUNTER — Encounter: Payer: Self-pay | Admitting: Gastroenterology

## 2014-09-23 ENCOUNTER — Ambulatory Visit: Payer: 59 | Admitting: Gastroenterology

## 2014-10-10 ENCOUNTER — Ambulatory Visit (INDEPENDENT_AMBULATORY_CARE_PROVIDER_SITE_OTHER): Payer: 59 | Admitting: Family Medicine

## 2014-10-10 ENCOUNTER — Encounter: Payer: Self-pay | Admitting: Family Medicine

## 2014-10-10 VITALS — BP 102/80 | HR 93 | Temp 98.7°F | Ht 69.25 in | Wt 155.6 lb

## 2014-10-10 DIAGNOSIS — Z0000 Encounter for general adult medical examination without abnormal findings: Secondary | ICD-10-CM | POA: Diagnosis not present

## 2014-10-10 DIAGNOSIS — E785 Hyperlipidemia, unspecified: Secondary | ICD-10-CM | POA: Diagnosis not present

## 2014-10-10 LAB — LIPID PANEL
Cholesterol: 232 mg/dL — ABNORMAL HIGH (ref 0–200)
HDL: 68.4 mg/dL (ref >39.00)
LDL Cholesterol: 147 mg/dL — ABNORMAL HIGH (ref 0–99)
NonHDL: 163.6
Total CHOL/HDL Ratio: 3
Triglycerides: 81 mg/dL (ref 0.0–149.0)
VLDL: 16.2 mg/dL (ref 0.0–40.0)

## 2014-10-10 NOTE — Progress Notes (Signed)
Pre visit review using our clinic review tool, if applicable. No additional management support is needed unless otherwise documented below in the visit note. 

## 2014-10-10 NOTE — Progress Notes (Signed)
HPI:  Here for CPE:  -Concerns and/or follow up today:   Allergies/mild SOB: -for several weeks -symptoms: nasal congestion, ears full and hurt intermittently, mild sensation of not being able to take a deep breath at times in the evening (not with activity)  - hx of mild asthma as a child -denies: fevers, chills, cough, wheezing  Sees rheumatologist for his psoriatic arthritis. Recently started methotrexate and will be doing labs with their office in a few weeks. Reports doing ok. Reports got steroid shot last week with his rheumatologist.  -Diet: variety of foods, balance and well rounded  -Exercise: regular exercise 3-4 days per week  -Diabetes and Dyslipidemia Screening: doing today - FASTING  -Hx of HTN: no  -Vaccines: UTD  -sexual activity: yes, male partner, no new partners  -wants STI testing, Hep C screening (if born 17-1965): no  -FH colon or prstate ca: see FH Last colon cancer screening: done Last prostate ca screening: done last year  -Alcohol, Tobacco, drug use: see social history  Review of Systems - no fevers, unintentional weight loss, vision loss, hearing loss, chest pain, sob, hemoptysis, melena, hematochezia, hematuria, genital discharge, changing or concerning skin lesions, bleeding, bruising, loc, thoughts of self harm or SI  Past Medical History  Diagnosis Date  . Hyperlipidemia   . GERD (gastroesophageal reflux disease)   . Hx of colonic polyps   . Prostatitis   . DJD (degenerative joint disease)   . Lower back pain   . Myofascial pain syndrome   . Anxiety   . Insomnia   . Glaucoma left eye  . Migraines   . Restless leg syndrome 08/25/2012  . ANXIETY 03/22/2007    Qualifier: Diagnosis of  By: Jenny Reichmann MD, Hunt Oris     Past Surgical History  Procedure Laterality Date  . Bilateral inguinal herniorrhaphy      1960's  . Appendectomy  1967  . Knee surgery x 2  2004,2008  . Bilateral shoulder surgery  2006, 2007,2010  . Ganglion cyst  surgery  2009  . Hernia repair  09/30/11    bilateral inguinal hernia    Family History  Problem Relation Age of Onset  . Coronary artery disease    . Hyperlipidemia    . Hypertension    . Cancer Mother     colon  . Colon cancer Mother   . Cancer Maternal Aunt     breast  . Cancer Maternal Uncle     prostate  . Colon cancer Maternal Uncle   . Esophageal cancer Neg Hx   . Rectal cancer Neg Hx   . Stomach cancer Neg Hx     History   Social History  . Marital Status: Married    Spouse Name: Reynold Bowen  . Number of Children: N/A  . Years of Education: N/A   Occupational History  . [employer]    Social History Main Topics  . Smoking status: Never Smoker   . Smokeless tobacco: Never Used  . Alcohol Use: No  . Drug Use: No  . Sexual Activity: Not on file   Other Topics Concern  . None   Social History Narrative   Work or School: [employer] - on call every few weeks      Home Situation: lives with wife      Spiritual Beliefs: Darrick Meigs, [place]      Lifestyle: walks 6 days per week; diet is fair  Current outpatient prescriptions:  .  acetaminophen (TYLENOL) 500 MG tablet, Take 500 mg by mouth every 6 (six) hours as needed., Disp: , Rfl:  .  eletriptan (RELPAX) 40 MG tablet, One tablet by mouth at onset of headache. May repeat in 2 hours if headache persists or recurs. may repeat in 2 hours if necessary, Disp: , Rfl:  .  latanoprost (XALATAN) 0.005 % ophthalmic solution, As needed, Disp: , Rfl:  .  Methotrexate Sodium (METHOTREXATE PO), Take by mouth. Take 6 tablets once a week, Disp: , Rfl:  .  naproxen sodium (ANAPROX) 220 MG tablet, Take 220 mg by mouth 2 (two) times daily as needed., Disp: , Rfl:  .  ondansetron (ZOFRAN-ODT) 8 MG disintegrating tablet, , Disp: , Rfl:  .  [DISCONTINUED] chlorproMAZINE (THORAZINE) 10 MG tablet, As needed for migraine headache , Disp: , Rfl:   EXAM:  Filed Vitals:   10/10/14 0811  BP: 102/80  Pulse: 93   Temp: 98.7 F (37.1 C)  TempSrc: Oral  Height: 5' 9.25" (1.759 m)  Weight: 155 lb 9.6 oz (70.58 kg)    Estimated body mass index is 22.81 kg/(m^2) as calculated from the following:   Height as of this encounter: 5' 9.25" (1.759 m).   Weight as of this encounter: 155 lb 9.6 oz (70.58 kg).  GENERAL: vitals reviewed and listed below, alert, oriented, appears well hydrated and in no acute distress  HEENT: head atraumatic, PERRLA, normal appearance of eyes, ears, nose and mouth. moist mucus membranes.  NECK: supple, no masses or lymphadenopathy  LUNGS: clear to auscultation bilaterally, no rales, rhonchi or wheeze  CV: HRRR, no peripheral edema or cyanosis, normal pedal pulses  ABDOMEN: bowel sounds normal, soft, non tender to palpation, no masses, no rebound or guarding  GU: declined  RECTAL: declined  SKIN: no rash or abnormal lesions  MS: normal gait, moves all extremities normally  NEURO: CN II-XII grossly intact, normal muscle strength and sensation to light touch on extremities  PSYCH: normal affect, pleasant and cooperative  ASSESSMENT AND PLAN:  Discussed the following assessment and plan:  Visit for preventive health examination  Hyperlipemia - Plan: Lipid Panel  -Discussed and advised all [place] preventive services health task force level A and B recommendations for age, sex and risks.  -Advised at least 150 minutes of exercise per week and a healthy diet low in saturated fats and sweets and consisting of fresh fruits and vegetables, lean meats such as fish and white chicken and whole grains.  -FASTING labs, studies and vaccines per orders this encounter  Patient advised to return to clinic immediately if symptoms worsen or persist or new concerns.  Patient Instructions  BEFORE YOU LEAVE: -labs -follow up yearly and as needed  We recommend the following healthy lifestyle measures: - eat a healthy diet consisting of lots of vegetables, fruits, beans, nuts,  seeds, healthy meats such as white chicken and fish and whole grains.  - avoid fried foods, fast food, processed foods, sodas, red meet and other fattening foods.  - get a least 150 minutes of aerobic exercise per week.      No Follow-up on file.   Colin Benton R.

## 2014-10-10 NOTE — Patient Instructions (Addendum)
BEFORE YOU LEAVE: -labs -follow up yearly and as needed  We recommend the following healthy lifestyle measures: - eat a healthy diet consisting of lots of vegetables, fruits, beans, nuts, seeds, healthy meats such as white chicken and fish and whole grains.  - avoid fried foods, fast food, processed foods, sodas, red meet and other fattening foods.  - get a least 150 minutes of aerobic exercise per week.

## 2014-11-04 ENCOUNTER — Ambulatory Visit: Payer: 59 | Admitting: Gastroenterology

## 2015-01-29 ENCOUNTER — Encounter: Payer: Self-pay | Admitting: Family Medicine

## 2015-01-29 ENCOUNTER — Emergency Department (HOSPITAL_COMMUNITY): Payer: 59

## 2015-01-29 ENCOUNTER — Ambulatory Visit (INDEPENDENT_AMBULATORY_CARE_PROVIDER_SITE_OTHER): Payer: 59 | Admitting: Family Medicine

## 2015-01-29 ENCOUNTER — Emergency Department (HOSPITAL_COMMUNITY)
Admission: EM | Admit: 2015-01-29 | Discharge: 2015-01-29 | Disposition: A | Discharge status: Home / Self Care | Payer: 59 | Attending: Emergency Medicine | Admitting: Emergency Medicine

## 2015-01-29 ENCOUNTER — Telehealth: Payer: Self-pay | Admitting: *Deleted

## 2015-01-29 ENCOUNTER — Encounter (HOSPITAL_COMMUNITY): Payer: Self-pay

## 2015-01-29 VITALS — BP 108/80 | HR 111 | Temp 98.7°F | Ht 69.25 in | Wt 159.2 lb

## 2015-01-29 DIAGNOSIS — G43909 Migraine, unspecified, not intractable, without status migrainosus: Secondary | ICD-10-CM | POA: Insufficient documentation

## 2015-01-29 DIAGNOSIS — R110 Nausea: Secondary | ICD-10-CM | POA: Diagnosis not present

## 2015-01-29 DIAGNOSIS — Z9889 Other specified postprocedural states: Secondary | ICD-10-CM | POA: Diagnosis not present

## 2015-01-29 DIAGNOSIS — R1011 Right upper quadrant pain: Secondary | ICD-10-CM

## 2015-01-29 DIAGNOSIS — R42 Dizziness and giddiness: Secondary | ICD-10-CM

## 2015-01-29 DIAGNOSIS — Z87438 Personal history of other diseases of male genital organs: Secondary | ICD-10-CM | POA: Insufficient documentation

## 2015-01-29 DIAGNOSIS — M1990 Unspecified osteoarthritis, unspecified site: Secondary | ICD-10-CM | POA: Diagnosis not present

## 2015-01-29 DIAGNOSIS — Z86010 Personal history of colonic polyps: Secondary | ICD-10-CM | POA: Diagnosis not present

## 2015-01-29 DIAGNOSIS — K219 Gastro-esophageal reflux disease without esophagitis: Secondary | ICD-10-CM | POA: Diagnosis not present

## 2015-01-29 DIAGNOSIS — R0789 Other chest pain: Secondary | ICD-10-CM | POA: Insufficient documentation

## 2015-01-29 DIAGNOSIS — H409 Unspecified glaucoma: Secondary | ICD-10-CM | POA: Diagnosis not present

## 2015-01-29 DIAGNOSIS — Z8659 Personal history of other mental and behavioral disorders: Secondary | ICD-10-CM | POA: Diagnosis not present

## 2015-01-29 DIAGNOSIS — J45909 Unspecified asthma, uncomplicated: Secondary | ICD-10-CM | POA: Insufficient documentation

## 2015-01-29 DIAGNOSIS — R079 Chest pain, unspecified: Secondary | ICD-10-CM | POA: Diagnosis not present

## 2015-01-29 DIAGNOSIS — R109 Unspecified abdominal pain: Secondary | ICD-10-CM

## 2015-01-29 DIAGNOSIS — R000 Tachycardia, unspecified: Secondary | ICD-10-CM

## 2015-01-29 DIAGNOSIS — R1031 Right lower quadrant pain: Secondary | ICD-10-CM | POA: Insufficient documentation

## 2015-01-29 HISTORY — DX: Unspecified asthma, uncomplicated: J45.909

## 2015-01-29 LAB — CBC
HCT: 44.9 % (ref 39.0–52.0)
Hemoglobin: 15.6 g/dL (ref 13.0–17.0)
MCH: 32.8 pg (ref 26.0–34.0)
MCHC: 34.7 g/dL (ref 30.0–36.0)
MCV: 94.3 fL (ref 78.0–100.0)
Platelets: 186 10*3/uL (ref 150–400)
RBC: 4.76 MIL/uL (ref 4.22–5.81)
RDW: 12.2 % (ref 11.5–15.5)
WBC: 4.3 10*3/uL (ref 4.0–10.5)

## 2015-01-29 LAB — COMPREHENSIVE METABOLIC PANEL
ALT: 29 U/L (ref 17–63)
AST: 30 U/L (ref 15–41)
Albumin: 4.4 g/dL (ref 3.5–5.0)
Alkaline Phosphatase: 48 U/L (ref 38–126)
Anion gap: 8 (ref 5–15)
BUN: 9 mg/dL (ref 6–20)
CO2: 25 mmol/L (ref 22–32)
Calcium: 8.9 mg/dL (ref 8.9–10.3)
Chloride: 108 mmol/L (ref 101–111)
Creatinine, Ser: 0.88 mg/dL (ref 0.61–1.24)
GFR calc Af Amer: >60 mL/min (ref >60)
GFR calc non Af Amer: >60 mL/min (ref >60)
Glucose, Bld: 93 mg/dL (ref 65–99)
Potassium: 3.4 mmol/L — ABNORMAL LOW (ref 3.5–5.1)
Sodium: 141 mmol/L (ref 135–145)
Total Bilirubin: 0.7 mg/dL (ref 0.3–1.2)
Total Protein: 7.1 g/dL (ref 6.5–8.1)

## 2015-01-29 LAB — URINALYSIS, ROUTINE W REFLEX MICROSCOPIC
Bilirubin Urine: NEGATIVE
Glucose, UA: NEGATIVE mg/dL
Hgb urine dipstick: NEGATIVE
Ketones, ur: NEGATIVE mg/dL
Leukocytes, UA: NEGATIVE
Nitrite: NEGATIVE
Protein, ur: NEGATIVE mg/dL
Specific Gravity, Urine: 1.01 (ref 1.005–1.030)
Urobilinogen, UA: 0.2 mg/dL (ref 0.0–1.0)
pH: 6 (ref 5.0–8.0)

## 2015-01-29 LAB — LIPASE, BLOOD: Lipase: 19 U/L — ABNORMAL LOW (ref 22–51)

## 2015-01-29 MED ORDER — SODIUM CHLORIDE 0.9 % IV SOLN
1000.0000 mL | Freq: Once | INTRAVENOUS | Status: AC
  Administered 2015-01-29: 1000 mL via INTRAVENOUS

## 2015-01-29 MED ORDER — KETOROLAC TROMETHAMINE 30 MG/ML IJ SOLN
30.0000 mg | Freq: Once | INTRAMUSCULAR | Status: AC
  Administered 2015-01-29: 30 mg via INTRAVENOUS
  Filled 2015-01-29: qty 1

## 2015-01-29 MED ORDER — SODIUM CHLORIDE 0.9 % IV SOLN
1000.0000 mL | INTRAVENOUS | Status: DC
  Administered 2015-01-29: 1000 mL via INTRAVENOUS

## 2015-01-29 MED ORDER — NAPROXEN 500 MG PO TABS: 500.0000 mg | ORAL_TABLET | Freq: Two times a day (BID) | ORAL | Status: DC

## 2015-01-29 MED ORDER — ONDANSETRON HCL 4 MG/2ML IJ SOLN
4.0000 mg | Freq: Once | INTRAMUSCULAR | Status: AC
  Administered 2015-01-29: 4 mg via INTRAVENOUS
  Filled 2015-01-29: qty 2

## 2015-01-29 MED ORDER — MORPHINE SULFATE (PF) 4 MG/ML IV SOLN
4.0000 mg | Freq: Once | INTRAVENOUS | Status: AC
  Administered 2015-01-29: 4 mg via INTRAVENOUS
  Filled 2015-01-29: qty 1

## 2015-01-29 MED ORDER — IOHEXOL 350 MG/ML SOLN
100.0000 mL | Freq: Once | INTRAVENOUS | Status: AC | PRN
  Administered 2015-01-29: 100 mL via INTRAVENOUS

## 2015-01-29 MED ORDER — PANTOPRAZOLE SODIUM 20 MG PO TBEC: 20.0000 mg | DELAYED_RELEASE_TABLET | Freq: Every day | ORAL | Status: DC

## 2015-01-29 NOTE — ED Notes (Addendum)
Patient c/o right upper quadrant pain that radiates into the right chest and right shoulder since yesserday. Patient states the pain is worse when he lays down. Patient also c/o nausea, but no vomiting. Patient saw his PCP this AM and was sent by the PCP to the ED.

## 2015-01-29 NOTE — Discharge Instructions (Signed)
Abdominal Pain Many things can cause abdominal pain. Usually, abdominal pain is not caused by a disease and will improve without treatment. It can often be observed and treated at home. Your health care provider will do a physical exam and possibly order blood tests and X-rays to help determine the seriousness of your pain. However, in many cases, more time must pass before a clear cause of the pain can be found. Before that point, your health care provider may not know if you need more testing or further treatment. HOME CARE INSTRUCTIONS  Monitor your abdominal pain for any changes. The following actions may help to alleviate any discomfort you are experiencing:  Only take over-the-counter or prescription medicines as directed by your health care provider.  Do not take laxatives unless directed to do so by your health care provider.  Try a clear liquid diet (broth, tea, or water) as directed by your health care provider. Slowly move to a bland diet as tolerated. SEEK MEDICAL CARE IF:  You have unexplained abdominal pain.  You have abdominal pain associated with nausea or diarrhea.  You have pain when you urinate or have a bowel movement.  You experience abdominal pain that wakes you in the night.  You have abdominal pain that is worsened or improved by eating food.  You have abdominal pain that is worsened with eating fatty foods.  You have a fever. SEEK IMMEDIATE MEDICAL CARE IF:   Your pain does not go away within 2 hours.  You keep throwing up (vomiting).  Your pain is felt only in portions of the abdomen, such as the right side or the left lower portion of the abdomen.  You pass bloody or black tarry stools. MAKE SURE YOU:  Understand these instructions.   Will watch your condition.   Will get help right away if you are not doing well or get worse.  Document Released: 03/10/2005 Document Revised: 06/05/2013 Document Reviewed: 02/07/2013 [facility] Patient Information  2015 [place], [place]. This information is not intended to replace advice given to you by your health care provider. Make sure you discuss any questions you have with your health care provider.  Chest Pain (Nonspecific) It is often hard to give a diagnosis for the cause of chest pain. There is always a chance that your pain could be related to something serious, such as a heart attack or a blood clot in the lungs. You need to follow up with your doctor. HOME CARE  If antibiotic medicine was given, take it as directed by your doctor. Finish the medicine even if you start to feel better.  For the next few days, avoid activities that bring on chest pain. Continue physical activities as told by your doctor.  Do not use any tobacco products. This includes cigarettes, chewing tobacco, and e-cigarettes.  Avoid drinking alcohol.  Only take medicine as told by your doctor.  Follow your doctor's suggestions for more testing if your chest pain does not go away.  Keep all doctor visits you made. GET HELP IF:  Your chest pain does not go away, even after treatment.  You have a rash with blisters on your chest.  You have a fever. GET HELP RIGHT AWAY IF:   You have more pain or pain that spreads to your arm, neck, jaw, back, or belly (abdomen).  You have shortness of breath.  You cough more than usual or cough up blood.  You have very bad back or belly pain.  You feel sick  to your stomach (nauseous) or throw up (vomit).  You have very bad weakness.  You pass out (faint).  You have chills. This is an emergency. Do not wait to see if the problems will go away. Call your local emergency services (911 in [place] Do not drive yourself to the hospital. MAKE SURE YOU:   Understand these instructions.  Will watch your condition.  Will get help right away if you are not doing well or get worse. Document Released: 11/17/2007 Document Revised: 06/05/2013 Document Reviewed: 11/17/2007 [facility]  Patient Information 2015 [place], [place]. This information is not intended to replace advice given to you by your health care provider. Make sure you discuss any questions you have with your health care provider.

## 2015-01-29 NOTE — Telephone Encounter (Signed)
Per Dr Maudie Mercury I called [facility] and informed Freda Munro in triage the pt is coming over by car now due to severe RUQ pain, nausea, dizziness which is worsening.

## 2015-01-29 NOTE — ED Provider Notes (Signed)
CSN: [id number]     Arrival date & time 01/29/15  1138 History   First MD Initiated Contact with Patient 01/29/15 1229     Chief Complaint  Patient presents with  . Abdominal Pain  . Nausea     (Consider location/radiation/quality/duration/timing/severity/associated sxs/prior Treatment) HPI RUQ pain sudden onset Monday night, woke from sleep. Radiates to chest ant. And posterior. No vomiting, very nauseated. Able to eat small amount yesterday. Loss of appetite. Pain significantly increased over several days. Occasional dry cough, no fever. Having pain episodes intermittent for 4-6 months, has been avoiding treatment, thought if gallstone would get better. Saw PCP today and insisted he be further evaluated. Yesterday felt like would pass out secondary to pain and nausea. Reports normal BMs without blood. Past Medical History  Diagnosis Date  . Hyperlipidemia   . GERD (gastroesophageal reflux disease)   . Hx of colonic polyps   . Prostatitis   . DJD (degenerative joint disease)   . Lower back pain   . Myofascial pain syndrome   . Anxiety   . Insomnia   . Glaucoma left eye  . Migraines   . Restless leg syndrome 08/25/2012  . ANXIETY 03/22/2007    Qualifier: Diagnosis of  By: Jenny Reichmann MD, Hunt Oris   . Asthma     as a child   Past Surgical History  Procedure Laterality Date  . Bilateral inguinal herniorrhaphy      1960's  . Appendectomy  1967  . Knee surgery x 2  2004,2008  . Bilateral shoulder surgery  2006, 2007,2010  . Ganglion cyst surgery  2009  . Hernia repair  09/30/11    bilateral inguinal hernia   Family History  Problem Relation Age of Onset  . Coronary artery disease    . Hyperlipidemia    . Hypertension    . Cancer Mother     colon  . Colon cancer Mother   . Cancer Maternal Aunt     breast  . Cancer Maternal Uncle     prostate  . Colon cancer Maternal Uncle   . Esophageal cancer Neg Hx   . Rectal cancer Neg Hx   . Stomach cancer Neg Hx    Social History   Substance Use Topics  . Smoking status: Never Smoker   . Smokeless tobacco: Never Used  . Alcohol Use: No    Review of Systems  10 Systems reviewed and are negative for acute change except as noted in the HPI.  Allergies  Prednisone; Sulfamethoxazole-trimethoprim; Adhesive; and Levofloxacin  Home Medications   Prior to Admission medications   Medication Sig Start Date End Date Taking? Authorizing Provider  acetaminophen (TYLENOL) 500 MG tablet Take 500 mg by mouth every 6 (six) hours as needed for mild pain, moderate pain or headache.    Yes Historical Provider, MD  eletriptan (RELPAX) 40 MG tablet Take 40 mg by mouth every 2 (two) hours as needed for migraine or headache. May repeat dose in 2 hours if necessary 01/13/15  Yes Historical Provider, MD  latanoprost (XALATAN) 0.005 % ophthalmic solution Place 1 drop into both eyes at bedtime.  03/22/11  Yes Historical Provider, MD  naproxen (NAPROSYN) 500 MG tablet Take 500 mg by mouth daily as needed for mild pain or moderate pain.    Yes Historical Provider, MD  omeprazole (PRILOSEC) 20 MG capsule Take 20 mg by mouth daily as needed (heartburn).  01/12/15  Yes Historical Provider, MD  ondansetron (ZOFRAN-ODT) 8 MG disintegrating tablet Take  8 mg by mouth every 6 (six) hours as needed for nausea or vomiting.  07/05/13  Yes Historical Provider, MD  rizatriptan (MAXALT) 10 MG tablet Take 10 mg by mouth every 2 (two) hours as needed for migraine. May repeat in 2 hours if needed 01/17/15  Yes Historical Provider, MD  naproxen (NAPROSYN) 500 MG tablet Take 1 tablet (500 mg total) by mouth 2 (two) times daily. 01/29/15   Charlesetta Shanks, MD  pantoprazole (PROTONIX) 20 MG tablet Take 1 tablet (20 mg total) by mouth daily. 01/29/15   Charlesetta Shanks, MD  Topiramate ER (TROKENDI XR) 25 MG CP24 Take 25 mg by mouth daily as needed. 01/13/15   Historical Provider, MD   BP 111/73 mmHg  Pulse 67  Temp(Src) 98.1 F (36.7 C) (Oral)  Resp 18  Ht 5\' 9"  (1.753 m)   Wt 159 lb (72.122 kg)  BMI 23.47 kg/m2  SpO2 99% Physical Exam  Constitutional: He is oriented to person, place, and time. He appears well-developed and well-nourished.  Pain paroxsyms. Well general appearance.  HENT:  Head: Normocephalic and atraumatic.  Eyes: EOM are normal. Pupils are equal, round, and reactive to light.  Neck: Neck supple.  Cardiovascular: Normal rate, regular rhythm, normal heart sounds and intact distal pulses.   Pulmonary/Chest: Effort normal and breath sounds normal.  Abdominal: Soft. Bowel sounds are normal. He exhibits no distension. There is tenderness.  RLQ and RUQ tender.  Musculoskeletal: Normal range of motion. He exhibits no edema or tenderness.  Neurological: He is alert and oriented to person, place, and time. He has normal strength. Coordination normal. GCS eye subscore is 4. GCS verbal subscore is 5. GCS motor subscore is 6.  Skin: Skin is warm, dry and intact.  Psychiatric: He has a normal mood and affect.    ED Course  Procedures (including critical care time) Labs Review Labs Reviewed  LIPASE, BLOOD - Abnormal; Notable for the following:    Lipase 19 (*)    All other components within normal limits  COMPREHENSIVE METABOLIC PANEL - Abnormal; Notable for the following:    Potassium 3.4 (*)    All other components within normal limits  CBC  URINALYSIS, ROUTINE W REFLEX MICROSCOPIC (NOT AT [facility])    Imaging Review Ct Angio Chest Pe W/cm &/or Wo Cm  01/29/2015   CLINICAL DATA:  Right upper quadrant pain radiating into the right chest and shoulder since 01/28/2015.  EXAM: CT ANGIOGRAPHY CHEST WITH CONTRAST  TECHNIQUE: Multidetector CT imaging of the chest was performed using the standard protocol during bolus administration of intravenous contrast. Multiplanar CT image reconstructions and MIPs were obtained to evaluate the vascular anatomy.  CONTRAST:  100 mL OMNIPAQUE IOHEXOL 350 MG/ML SOLN  COMPARISON:  PA and lateral chest 08/31/2013.  FINDINGS:  No pulmonary embolus is identified. There is no axillary, hilar or mediastinal lymphadenopathy. Heart size is normal. No pleural or pericardial effusion. The lungs demonstrate mild dependent atelectasis but are otherwise clear. Imaged upper abdomen is unremarkable. No focal bony abnormality is seen.  Review of the MIP images confirms the above findings.  IMPRESSION: Negative for pulmonary embolus.  Negative exam.   Electronically Signed   By: Inge Rise M.D.   On: 01/29/2015 16:12   Ct Renal Stone Study  01/29/2015   CLINICAL DATA:  Right upper quadrant pain radiating into the right chest and shoulder since 01/28/2015.  EXAM: CT ABDOMEN AND PELVIS WITHOUT CONTRAST  TECHNIQUE: Multidetector CT imaging of the abdomen and pelvis was  performed following the standard protocol without IV contrast.  COMPARISON:  None.  FINDINGS: The lung bases are clear. There is no pleural or pericardial effusion. Heart size is normal.  There are no renal, ureteral or urinary bladder stones. The gallbladder, liver, spleen, adrenal glands and pancreas appear normal. The stomach, small and large bowel and appendix appear normal. Retroaortic left renal vein is incidentally noted. A few scattered atherosclerotic calcifications are seen in the aorta and right common iliac artery. There is no lymphadenopathy or fluid.  No bony abnormality is identified.  IMPRESSION: No acute abnormality or finding to explain the patient's symptoms. Negative for urinary tract stone.  Mild atherosclerosis.   Electronically Signed   By: Inge Rise M.D.   On: 01/29/2015 13:39   I have personally reviewed and evaluated these images and lab results as part of my medical decision-making.   EKG Interpretation None      MDM   Final diagnoses:  Right flank pain  Other chest pain   Patient had appearance of severe pain paroxsms. At this time he has ruled out for PE, kidney stone and biliary colic. No other acute abnormalities are identified  on diagnostic workup. The patient is nontoxic and otherwise well appearance. At this point time plan will be for pain control and continued outpatient workup with his family doctor. He is counseled on signs and symptoms for which to watch for the possibility of shingles. At this time there is no associated rash.     Charlesetta Shanks, MD 01/29/15 1710

## 2015-01-29 NOTE — ED Notes (Signed)
Patient transported to CT 

## 2015-01-29 NOTE — Progress Notes (Signed)
HPI:  Severe RUQ pain: -started a few days ago, intermittently, now worsening and persistent -reports had a similar episode 4 months ago that resolved spontaneously and another episode a few weeks ago but much milder and without other symptoms -pain is coming and going in waves, sharp severe, getting worse in RUQ - radiating to R chest and shoulder, dizzy, malaise, anorexia, chills at times, SOB - he thinks from the pain, severe today making ambulation difficult -worse when lying down -denies: fevers, vomiting, diarrhea, constipation - last BM this morning, hematochezia, melena -hx of appy, hx GERD and esophageal erosion  ROS: See pertinent positives and negatives per HPI.  Past Medical History  Diagnosis Date  . Hyperlipidemia   . GERD (gastroesophageal reflux disease)   . Hx of colonic polyps   . Prostatitis   . DJD (degenerative joint disease)   . Lower back pain   . Myofascial pain syndrome   . Anxiety   . Insomnia   . Glaucoma left eye  . Migraines   . Restless leg syndrome 08/25/2012  . ANXIETY 03/22/2007    Qualifier: Diagnosis of  By: Jenny Reichmann MD, Hunt Oris     Past Surgical History  Procedure Laterality Date  . Bilateral inguinal herniorrhaphy      1960's  . Appendectomy  1967  . Knee surgery x 2  2004,2008  . Bilateral shoulder surgery  2006, 2007,2010  . Ganglion cyst surgery  2009  . Hernia repair  09/30/11    bilateral inguinal hernia    Family History  Problem Relation Age of Onset  . Coronary artery disease    . Hyperlipidemia    . Hypertension    . Cancer Mother     colon  . Colon cancer Mother   . Cancer Maternal Aunt     breast  . Cancer Maternal Uncle     prostate  . Colon cancer Maternal Uncle   . Esophageal cancer Neg Hx   . Rectal cancer Neg Hx   . Stomach cancer Neg Hx     Social History   Social History  . Marital Status: Married    Spouse Name: Reynold Bowen  . Number of Children: N/A  . Years of Education: N/A   Occupational History   . [employer]    Social History Main Topics  . Smoking status: Never Smoker   . Smokeless tobacco: Never Used  . Alcohol Use: No  . Drug Use: No  . Sexual Activity: Not Asked   Other Topics Concern  . None   Social History Narrative   Work or School: [employer] - on call every few weeks      Home Situation: lives with wife      Spiritual Beliefs: Darrick Meigs, [place]      Lifestyle: walks 6 days per week; diet is fair                 Current outpatient prescriptions:  .  acetaminophen (TYLENOL) 500 MG tablet, Take 500 mg by mouth every 6 (six) hours as needed., Disp: , Rfl:  .  eletriptan (RELPAX) 40 MG tablet, One tablet by mouth at onset of headache. May repeat in 2 hours if headache persists or recurs. may repeat in 2 hours if necessary, Disp: , Rfl:  .  latanoprost (XALATAN) 0.005 % ophthalmic solution, As needed, Disp: , Rfl:  .  naproxen sodium (ANAPROX) 220 MG tablet, Take 220 mg by mouth 2 (two) times daily as needed., Disp: ,  Rfl:  .  ondansetron (ZOFRAN-ODT) 8 MG disintegrating tablet, , Disp: , Rfl:  .  rizatriptan (MAXALT) 10 MG tablet, Take 10 mg by mouth., Disp: , Rfl:  .  [DISCONTINUED] chlorproMAZINE (THORAZINE) 10 MG tablet, As needed for migraine headache , Disp: , Rfl:   EXAM:  Filed Vitals:   01/29/15 1057  BP: 108/80  Pulse: 111  Temp: 98.7 F (37.1 C)    Body mass index is 23.34 kg/(m^2).  GENERAL: vitals reviewed and listed above, alert, oriented, appears well hydrated, appears to be in pain, hunched over on exam table  HEENT: atraumatic, conjunttiva clear, no obvious abnormalities on inspection of external nose and ears  NECK: no obvious masses on inspection  LUNGS: clear to auscultation bilaterally, no wheezes, rales or rhonchi, good air movement but winces with deep breaths  CV: Tachy, no peripheral edema  ABD: BS+, heart sounds auscultated in L mid abd, exquisite TTP and guarding in RUQ especially under ribcage making deep palpation  impossible, milder TTP in epigastric region, no rib or muscle TTP, ? Mild rebound - winces with this maneuver, pain with foot slap  MS: moves all extremities, walks hunched over  PSYCH: pleasant and cooperative, no obvious depression or anxiety  ASSESSMENT AND PLAN:  Discussed the following assessment and plan:  RUQ abdominal pain  Chest pain at rest  Nausea without vomiting  Dizziness  Tachycardia  -we discussed possible serious and likely etiologies, workup and treatment, treatment risks and return precautions - suspect biliary colic, but given degree of pain and other symptoms needs urgent eval to R/O other -after this discussion, Umberto opted for evaluation in ED -he is having some difficulty ambulating but prefers wife drive him, advised assistant to notify ED personel  -Patient advised to return or notify a doctor immediately if symptoms worsen or persist or new concerns arise.  There are no Patient Instructions on file for this visit.   Colin Benton R.

## 2015-01-29 NOTE — Progress Notes (Signed)
Pre visit review using our clinic review tool, if applicable. No additional management support is needed unless otherwise documented below in the visit note. 

## 2015-01-31 ENCOUNTER — Telehealth: Payer: Self-pay | Admitting: Family Medicine

## 2015-01-31 NOTE — Telephone Encounter (Signed)
Wife states dr Maudie Mercury was to write a note for pt for work,  But they forgot to get. Pt needs excused from work from 8/16 to 8/18. pls call when ready to pu

## 2015-02-03 ENCOUNTER — Encounter: Payer: Self-pay | Admitting: *Deleted

## 2015-02-03 NOTE — Telephone Encounter (Signed)
ok 

## 2015-02-03 NOTE — Telephone Encounter (Signed)
Patient came in to the office and I gave him the note.

## 2015-02-18 ENCOUNTER — Telehealth: Payer: Self-pay | Admitting: Family Medicine

## 2015-02-18 NOTE — Telephone Encounter (Signed)
Pt wants to transfer to Dr. Jenny Reichmann because all his family see him also.  Please advise

## 2015-02-18 NOTE — Telephone Encounter (Signed)
Ok with me 

## 2015-02-18 NOTE — Telephone Encounter (Signed)
[place] with me if ok with Dr. Jenny Reichmann.

## 2015-02-19 NOTE — Telephone Encounter (Signed)
Pt informed

## 2015-03-04 ENCOUNTER — Encounter: Payer: Self-pay | Admitting: Internal Medicine

## 2015-03-04 ENCOUNTER — Ambulatory Visit (INDEPENDENT_AMBULATORY_CARE_PROVIDER_SITE_OTHER): Payer: 59 | Admitting: Internal Medicine

## 2015-03-04 VITALS — BP 122/70 | HR 85 | Temp 98.1°F | Ht 69.0 in | Wt 158.0 lb

## 2015-03-04 DIAGNOSIS — L4050 Arthropathic psoriasis, unspecified: Secondary | ICD-10-CM | POA: Diagnosis not present

## 2015-03-04 DIAGNOSIS — K219 Gastro-esophageal reflux disease without esophagitis: Secondary | ICD-10-CM | POA: Diagnosis not present

## 2015-03-04 DIAGNOSIS — Z23 Encounter for immunization: Secondary | ICD-10-CM

## 2015-03-04 DIAGNOSIS — Z0000 Encounter for general adult medical examination without abnormal findings: Secondary | ICD-10-CM | POA: Insufficient documentation

## 2015-03-04 DIAGNOSIS — E785 Hyperlipidemia, unspecified: Secondary | ICD-10-CM

## 2015-03-04 DIAGNOSIS — Z0189 Encounter for other specified special examinations: Secondary | ICD-10-CM | POA: Diagnosis not present

## 2015-03-04 HISTORY — DX: Arthropathic psoriasis, unspecified: L40.50

## 2015-03-04 NOTE — Progress Notes (Signed)
Subjective:    Patient ID: [facility], male    DOB: 10-05-1960, 54 y.o.   MRN: [id number]  HPI  Here to establish with new PCP; c/o pain to the right lateral lowest chest, assoc often with some pain to the right upper chect, right upper back, and right shoulder.  Was seen in ED Aug 17 with neg for acute - CTA chest and abd.pelvic CT. Still with mild intermittent pain since that time but feels probably improving. Denies worsening reflux, abd pain, dysphagia, n/v, bowel change or blood. Was On MTX for psoriatic arthritis but not recently, had been on plaquinil prior but could not tolerate.  Sees Derm- Dr Clover Mealy, and Rheum - Dr Marijean Bravo;  Pt denies chest pain, increased sob or doe, wheezing, orthopnea, PND, increased LE swelling, palpitations, dizziness or syncope.  Trying to follow lower chol diet, and did not have psa with last labs. Denies urinary symptoms such as dysuria, frequency, urgency, flank pain, hematuria or n/v, fever, chills.  Now on Humira with first injection 2 days ago.   Past Medical History  Diagnosis Date  . Hyperlipidemia   . GERD (gastroesophageal reflux disease)   . Hx of colonic polyps   . Prostatitis   . DJD (degenerative joint disease)   . Lower back pain   . Myofascial pain syndrome   . Anxiety   . Insomnia   . Glaucoma left eye  . Migraines   . Restless leg syndrome 08/25/2012  . ANXIETY 03/22/2007    Qualifier: Diagnosis of  By: Jenny Reichmann MD, Hunt Oris   . Asthma     as a child  . Psoriatic arthritis 03/04/2015   Past Surgical History  Procedure Laterality Date  . Bilateral inguinal herniorrhaphy      1960's  . Appendectomy  1967  . Knee surgery x 2  2004,2008  . Bilateral shoulder surgery  2006, 2007,2010  . Ganglion cyst surgery  2009  . Hernia repair  09/30/11    bilateral inguinal hernia    reports that he has never smoked. He has never used smokeless tobacco. He reports that he does not drink alcohol or use illicit drugs. family history includes  Cancer in his maternal aunt, maternal uncle, and mother; Colon cancer in his maternal uncle and mother; Coronary artery disease in an other family member; Hyperlipidemia in an other family member; Hypertension in an other family member. There is no history of Esophageal cancer, Rectal cancer, or Stomach cancer. Allergies  Allergen Reactions  . Prednisone Other (See Comments)    pred pak makes the pt very agitated--pt can tolerate the depo medrol injection  . Sulfamethoxazole-Trimethoprim     REACTION: rash  . Adhesive [Tape] Itching and Rash  . Levofloxacin Rash   Current Outpatient Prescriptions on File Prior to Visit  Medication Sig Dispense Refill  . acetaminophen (TYLENOL) 500 MG tablet Take 500 mg by mouth every 6 (six) hours as needed for mild pain, moderate pain or headache.     . latanoprost (XALATAN) 0.005 % ophthalmic solution Place 1 drop into both eyes at bedtime.     . naproxen (NAPROSYN) 500 MG tablet Take 500 mg by mouth daily as needed for mild pain or moderate pain.     Marland Kitchen omeprazole (PRILOSEC) 20 MG capsule Take 20 mg by mouth daily as needed (heartburn).     . ondansetron (ZOFRAN-ODT) 8 MG disintegrating tablet Take 8 mg by mouth every 6 (six) hours as needed for nausea or  vomiting.     . rizatriptan (MAXALT) 10 MG tablet Take 10 mg by mouth every 2 (two) hours as needed for migraine. May repeat in 2 hours if needed    . eletriptan (RELPAX) 40 MG tablet Take 40 mg by mouth every 2 (two) hours as needed for migraine or headache. May repeat dose in 2 hours if necessary    . pantoprazole (PROTONIX) 20 MG tablet Take 1 tablet (20 mg total) by mouth daily. (Patient not taking: Reported on 03/04/2015) 30 tablet 0  . Topiramate ER (TROKENDI XR) 25 MG CP24 Take 25 mg by mouth daily as needed.    . [DISCONTINUED] chlorproMAZINE (THORAZINE) 10 MG tablet As needed for migraine headache      No current facility-administered medications on file prior to visit.   Review of Systems   Constitutional: Negative for unusual diaphoresis or night sweats HENT: Negative for ringing in ear or discharge Eyes: Negative for double vision or worsening visual disturbance.  Respiratory: Negative for choking and stridor.   Gastrointestinal: Negative for vomiting or other signifcant bowel change Genitourinary: Negative for hematuria or change in urine volume.  Musculoskeletal: Negative for other MSK pain or swelling Skin: Negative for color change and worsening wound.  Neurological: Negative for tremors and numbness other than noted  Psychiatric/Behavioral: Negative for decreased concentration or agitation other than above       Objective:   Physical Exam BP 122/70 mmHg  Pulse 85  Temp(Src) 98.1 F (36.7 C) (Oral)  Ht 5\' 9"  (1.753 m)  Wt 158 lb (71.668 kg)  BMI 23.32 kg/m2  SpO2 97% VS noted,  Constitutional: Pt appears in no significant distress HENT: Head: NCAT.  Right Ear: External ear normal.  Left Ear: External ear normal.  Eyes: . Pupils are equal, round, and reactive to light. Conjunctivae and EOM are normal Neck: Normal range of motion. Neck supple.  Cardiovascular: Normal rate and regular rhythm.   Pulmonary/Chest: Effort normal and breath sounds without rales or wheezing.  Abd:  Soft, NT, ND, + BS Neurological: Pt is alert. Not confused , motor grossly intact Skin: Skin is warm. No rash, no LE edema No joint swelling or rash Psychiatric: Pt behavior is normal. No agitation.     Assessment & Plan:

## 2015-03-04 NOTE — Patient Instructions (Addendum)
You had the flu shot today  Please continue all other medications as before, and refills have been done if requested.  Please have the pharmacy call with any other refills you may need.  Please continue your efforts at being more active, low cholesterol diet, and weight control.  You are otherwise up to date with prevention measures today.  Please keep your appointments with your specialists as you may have planned  Please return in 6 months, or sooner if needed, with Lab testing done 3-5 days before  

## 2015-03-04 NOTE — Assessment & Plan Note (Signed)
To f/u rheum as planned, started humira, watch for fever

## 2015-03-04 NOTE — Assessment & Plan Note (Signed)
D/w pt - o.w stable overall by history and exam, recent data reviewed with pt, and pt to continue medical treatment as before,  to f/u any worsening symptoms or concerns Lab Results  Component Value Date   LDLCALC 147* 10/10/2014   For f/u lab next visit

## 2015-03-04 NOTE — Assessment & Plan Note (Signed)
stable overall by history and exam, recent data reviewed with pt, and pt to continue medical treatment as before,  to f/u any worsening symptoms or concerns Lab Results  Component Value Date   WBC 4.3 01/29/2015   HGB 15.6 01/29/2015   HCT 44.9 01/29/2015   PLT 186 01/29/2015   GLUCOSE 93 01/29/2015   CHOL 232* 10/10/2014   TRIG 81.0 10/10/2014   HDL 68.40 10/10/2014   LDLDIRECT 135.3 08/24/2012   LDLCALC 147* 10/10/2014   ALT 29 01/29/2015   AST 30 01/29/2015   NA 141 01/29/2015   K 3.4* 01/29/2015   CL 108 01/29/2015   CREATININE 0.88 01/29/2015   BUN 9 01/29/2015   CO2 25 01/29/2015   TSH 1.72 08/30/2013   PSA 1.05 08/30/2013

## 2015-03-04 NOTE — Progress Notes (Signed)
Pre visit review using our clinic review tool, if applicable. No additional management support is needed unless otherwise documented below in the visit note. 

## 2015-10-15 ENCOUNTER — Ambulatory Visit: Payer: 59 | Admitting: Internal Medicine

## 2016-01-27 ENCOUNTER — Ambulatory Visit: Payer: 59 | Admitting: Internal Medicine

## 2016-01-27 NOTE — Progress Notes (Deleted)
[name] [address], [place] [address] Phone: [phone] Subjective:    I'm seeing this patient by the request  of:  Cathlean Cower, MD   CC: Left shoulder pain  [id number]  Randy Moran is a 55 y.o. male coming in with complaint of left shoulder pain. History of psoriatic arthritis.     Past Medical History:  Diagnosis Date  . Anxiety   . ANXIETY 03/22/2007   Qualifier: Diagnosis of  By: Jenny Reichmann MD, Hunt Oris   . Asthma    as a child  . DJD (degenerative joint disease)   . GERD (gastroesophageal reflux disease)   . Glaucoma left eye  . Hx of colonic polyps   . Hyperlipidemia   . Insomnia   . Lower back pain   . Migraines   . Myofascial pain syndrome   . Prostatitis   . Psoriatic arthritis 03/04/2015  . Restless leg syndrome 08/25/2012   Past Surgical History:  Procedure Laterality Date  . APPENDECTOMY  1967  . bilateral inguinal herniorrhaphy     1960's  . bilateral shoulder surgery  2006, 2007,2010  . ganglion cyst surgery  2009  . HERNIA REPAIR  09/30/11   bilateral inguinal hernia  . knee surgery x 2  2004,2008   Social History   Social History  . Marital status: Married    Spouse name: Reynold Bowen  . Number of children: N/A  . Years of education: N/A   Occupational History  . 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 Smoking status: Never Smoker  . Smokeless tobacco: Never Used  . Alcohol use No  . Drug use: No  . Sexual activity: Not on file   Other Topics Concern  . Not on file   Social History Narrative   Work or School: [employer] - on call every few weeks      Home Situation: lives with wife      Spiritual Beliefs: Darrick Meigs, [place]      Lifestyle: walks 6 days per week; diet is fair               Allergies  Allergen Reactions  . Prednisone Other (See Comments)    pred pak makes the pt very agitated--pt can tolerate the depo medrol injection  .  Sulfamethoxazole-Trimethoprim     REACTION: rash  . Adhesive [Tape] Itching and Rash  . Levofloxacin Rash   Family History  Problem Relation Age of Onset  . Coronary artery disease    . Hyperlipidemia    . Hypertension    . Cancer Mother     colon  . Colon cancer Mother   . Cancer Maternal Aunt     breast  . Cancer Maternal Uncle     prostate  . Colon cancer Maternal Uncle   . Esophageal cancer Neg Hx   . Rectal cancer Neg Hx   . Stomach cancer Neg Hx     Past medical history, social, surgical and family history all reviewed in electronic medical record.  No pertanent information unless stated regarding to the chief complaint.   Review of Systems: No headache, visual changes, nausea, vomiting, diarrhea, constipation, dizziness, abdominal pain, skin rash, fevers, chills, night sweats, weight loss, swollen lymph nodes, body aches, joint swelling, muscle aches, chest pain, shortness of breath, mood changes.   Objective  There were no vitals taken for this visit.  General: No apparent distress alert and oriented  x3 mood and affect normal, dressed appropriately.  HEENT: Pupils equal, extraocular movements intact  Respiratory: Patient's speak in full sentences and does not appear short of breath  Cardiovascular: No lower extremity edema, non tender, no erythema  Skin: Warm dry intact with no signs of infection or rash on extremities or on axial skeleton.  Abdomen: Soft nontender  Neuro: Cranial nerves II through XII are intact, neurovascularly intact in all extremities with 2+ DTRs and 2+ pulses.  Lymph: No lymphadenopathy of posterior or anterior cervical chain or axillae bilaterally.  Gait normal with good balance and coordination.  MSK:  Non tender with full range of motion and good stability and symmetric strength and tone of , elbows, wrist, hip, knee and ankles bilaterally.  Shoulder: left Inspection reveals no abnormalities, atrophy or asymmetry. Palpation is normal with no  tenderness over AC joint or bicipital groove. ROM is full in all planes passively. Rotator cuff strength normal throughout. signs of impingement with positive Neer and Hawkin's tests, but negative empty can sign. Speeds and Yergason's tests normal. No labral pathology noted with negative Obrien's, negative clunk and good stability. Normal scapular function observed. No painful arc and no drop arm sign. No apprehension sign  MSK US performed of: left This study was ordered, performed, and interpreted by Charlann Boxer D.O.  Shoulder:   Supraspinatus:  Appears normal on long and transverse views, Bursal bulge seen with shoulder abduction on impingement view. Infraspinatus:  Appears normal on long and transverse views. Significant increase in Doppler flow Subscapularis:  Appears normal on long and transverse views. Positive bursa Teres Minor:  Appears normal on long and transverse views. AC joint:  Capsule undistended, no geyser sign. Glenohumeral Joint:  Appears normal without effusion. Glenoid Labrum:  Intact without visualized tears. Biceps Tendon:  Appears normal on long and transverse views, no fraying of tendon, tendon located in intertubercular groove, no subluxation with shoulder internal or external rotation.  Impression: Subacromial bursitis  Procedure: Real-time Ultrasound Guided Injection of left glenohumeral joint Device: GE Logiq E  Ultrasound guided injection is preferred based studies that show increased duration, increased effect, greater accuracy, decreased procedural pain, increased response rate with ultrasound guided versus blind injection.  Verbal informed consent obtained.  Time-out conducted.  Noted no overlying erythema, induration, or other signs of local infection.  Skin prepped in a sterile fashion.  Local anesthesia: Topical Ethyl chloride.  With sterile technique and under real time ultrasound guidance:  Joint visualized.  23g 1  inch needle inserted posterior  approach. Pictures taken for needle placement. Patient did have injection of 2 cc of 1% lidocaine, 2 cc of 0.5% Marcaine, and 1.0 cc of Kenalog 40 mg/dL. Completed without difficulty  Pain immediately resolved suggesting accurate placement of the medication.  Advised to call if fevers/chills, erythema, induration, drainage, or persistent bleeding.  Images permanently stored and available for review in the ultrasound unit.  Impression: Technically successful ultrasound guided injection.    Impression and Recommendations:     This case required medical decision making of moderate complexity.      Note: This dictation was prepared with Dragon dictation along with smaller phrase technology. Any transcriptional errors that result from this process are unintentional.

## 2016-01-28 ENCOUNTER — Ambulatory Visit: Payer: 59 | Admitting: Family Medicine

## 2016-02-04 ENCOUNTER — Ambulatory Visit: Payer: 59 | Admitting: Family Medicine

## 2016-02-04 NOTE — Progress Notes (Deleted)
[name] [address], [place] [address] Phone: [phone] Subjective:    I'm seeing this patient by the request  of:  Cathlean Cower, MD   CC: Left shoulder pain  [id number]  Randy Moran is a 55 y.o. male coming in with complaint of left shoulder pain. History of psoriatic arthritis.     Past Medical History:  Diagnosis Date  . Anxiety   . ANXIETY 03/22/2007   Qualifier: Diagnosis of  By: Jenny Reichmann MD, Hunt Oris   . Asthma    as a child  . DJD (degenerative joint disease)   . GERD (gastroesophageal reflux disease)   . Glaucoma left eye  . Hx of colonic polyps   . Hyperlipidemia   . Insomnia   . Lower back pain   . Migraines   . Myofascial pain syndrome   . Prostatitis   . Psoriatic arthritis 03/04/2015  . Restless leg syndrome 08/25/2012   Past Surgical History:  Procedure Laterality Date  . APPENDECTOMY  1967  . bilateral inguinal herniorrhaphy     1960's  . bilateral shoulder surgery  2006, 2007,2010  . ganglion cyst surgery  2009  . HERNIA REPAIR  09/30/11   bilateral inguinal hernia  . knee surgery x 2  2004,2008   Social History   Social History  . Marital status: Married    Spouse name: Reynold Bowen  . Number of children: N/A  . Years of education: N/A   Occupational History  . 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 Smoking status: Never Smoker  . Smokeless tobacco: Never Used  . Alcohol use No  . Drug use: No  . Sexual activity: Not on file   Other Topics Concern  . Not on file   Social History Narrative   Work or School: [employer] - on call every few weeks      Home Situation: lives with wife      Spiritual Beliefs: Darrick Meigs, [place]      Lifestyle: walks 6 days per week; diet is fair               Allergies  Allergen Reactions  . Prednisone Other (See Comments)    pred pak makes the pt very agitated--pt can tolerate the depo medrol injection  .  Sulfamethoxazole-Trimethoprim     REACTION: rash  . Adhesive [Tape] Itching and Rash  . Levofloxacin Rash   Family History  Problem Relation Age of Onset  . Coronary artery disease    . Hyperlipidemia    . Hypertension    . Cancer Mother     colon  . Colon cancer Mother   . Cancer Maternal Aunt     breast  . Cancer Maternal Uncle     prostate  . Colon cancer Maternal Uncle   . Esophageal cancer Neg Hx   . Rectal cancer Neg Hx   . Stomach cancer Neg Hx     Past medical history, social, surgical and family history all reviewed in electronic medical record.  No pertanent information unless stated regarding to the chief complaint.   Review of Systems: No headache, visual changes, nausea, vomiting, diarrhea, constipation, dizziness, abdominal pain, skin rash, fevers, chills, night sweats, weight loss, swollen lymph nodes, body aches, joint swelling, muscle aches, chest pain, shortness of breath, mood changes.   Objective  There were no vitals taken for this visit.  General: No apparent distress alert and oriented  x3 mood and affect normal, dressed appropriately.  HEENT: Pupils equal, extraocular movements intact  Respiratory: Patient's speak in full sentences and does not appear short of breath  Cardiovascular: No lower extremity edema, non tender, no erythema  Skin: Warm dry intact with no signs of infection or rash on extremities or on axial skeleton.  Abdomen: Soft nontender  Neuro: Cranial nerves II through XII are intact, neurovascularly intact in all extremities with 2+ DTRs and 2+ pulses.  Lymph: No lymphadenopathy of posterior or anterior cervical chain or axillae bilaterally.  Gait normal with good balance and coordination.  MSK:  Non tender with full range of motion and good stability and symmetric strength and tone of , elbows, wrist, hip, knee and ankles bilaterally.  Shoulder: left Inspection reveals no abnormalities, atrophy or asymmetry. Palpation is normal with no  tenderness over AC joint or bicipital groove. ROM is full in all planes passively. Rotator cuff strength normal throughout. signs of impingement with positive Neer and Hawkin's tests, but negative empty can sign. Speeds and Yergason's tests normal. No labral pathology noted with negative Obrien's, negative clunk and good stability. Normal scapular function observed. No painful arc and no drop arm sign. No apprehension sign  MSK US performed of: left This study was ordered, performed, and interpreted by Charlann Boxer D.O.  Shoulder:   Supraspinatus:  Appears normal on long and transverse views, Bursal bulge seen with shoulder abduction on impingement view. Infraspinatus:  Appears normal on long and transverse views. Significant increase in Doppler flow Subscapularis:  Appears normal on long and transverse views. Positive bursa Teres Minor:  Appears normal on long and transverse views. AC joint:  Capsule undistended, no geyser sign. Glenohumeral Joint:  Appears normal without effusion. Glenoid Labrum:  Intact without visualized tears. Biceps Tendon:  Appears normal on long and transverse views, no fraying of tendon, tendon located in intertubercular groove, no subluxation with shoulder internal or external rotation.  Impression: Subacromial bursitis  Procedure: Real-time Ultrasound Guided Injection of left glenohumeral joint Device: GE Logiq E  Ultrasound guided injection is preferred based studies that show increased duration, increased effect, greater accuracy, decreased procedural pain, increased response rate with ultrasound guided versus blind injection.  Verbal informed consent obtained.  Time-out conducted.  Noted no overlying erythema, induration, or other signs of local infection.  Skin prepped in a sterile fashion.  Local anesthesia: Topical Ethyl chloride.  With sterile technique and under real time ultrasound guidance:  Joint visualized.  23g 1  inch needle inserted posterior  approach. Pictures taken for needle placement. Patient did have injection of 2 cc of 1% lidocaine, 2 cc of 0.5% Marcaine, and 1.0 cc of Kenalog 40 mg/dL. Completed without difficulty  Pain immediately resolved suggesting accurate placement of the medication.  Advised to call if fevers/chills, erythema, induration, drainage, or persistent bleeding.  Images permanently stored and available for review in the ultrasound unit.  Impression: Technically successful ultrasound guided injection.    Impression and Recommendations:     This case required medical decision making of moderate complexity.      Note: This dictation was prepared with Dragon dictation along with smaller phrase technology. Any transcriptional errors that result from this process are unintentional.

## 2016-02-05 ENCOUNTER — Ambulatory Visit: Payer: 59 | Admitting: Family Medicine

## 2016-02-20 ENCOUNTER — Ambulatory Visit (INDEPENDENT_AMBULATORY_CARE_PROVIDER_SITE_OTHER): Payer: 59 | Admitting: Internal Medicine

## 2016-02-20 ENCOUNTER — Encounter: Payer: Self-pay | Admitting: Internal Medicine

## 2016-02-20 ENCOUNTER — Telehealth: Payer: Self-pay | Admitting: *Deleted

## 2016-02-20 ENCOUNTER — Other Ambulatory Visit: Payer: Self-pay | Admitting: Internal Medicine

## 2016-02-20 ENCOUNTER — Other Ambulatory Visit (INDEPENDENT_AMBULATORY_CARE_PROVIDER_SITE_OTHER): Payer: 59

## 2016-02-20 VITALS — BP 118/72 | HR 76 | Temp 98.7°F | Resp 20 | Wt 158.0 lb

## 2016-02-20 DIAGNOSIS — G479 Sleep disorder, unspecified: Secondary | ICD-10-CM | POA: Insufficient documentation

## 2016-02-20 DIAGNOSIS — R6889 Other general symptoms and signs: Secondary | ICD-10-CM

## 2016-02-20 DIAGNOSIS — G4726 Circadian rhythm sleep disorder, shift work type: Secondary | ICD-10-CM | POA: Diagnosis not present

## 2016-02-20 DIAGNOSIS — Z0000 Encounter for general adult medical examination without abnormal findings: Secondary | ICD-10-CM | POA: Diagnosis not present

## 2016-02-20 DIAGNOSIS — Z23 Encounter for immunization: Secondary | ICD-10-CM

## 2016-02-20 DIAGNOSIS — Z1159 Encounter for screening for other viral diseases: Secondary | ICD-10-CM | POA: Diagnosis not present

## 2016-02-20 DIAGNOSIS — Z0001 Encounter for general adult medical examination with abnormal findings: Secondary | ICD-10-CM | POA: Diagnosis not present

## 2016-02-20 LAB — HEPATIC FUNCTION PANEL
ALT: 37 U/L (ref 0–53)
AST: 23 U/L (ref 0–37)
Albumin: 4.5 g/dL (ref 3.5–5.2)
Alkaline Phosphatase: 43 U/L (ref 39–117)
Bilirubin, Direct: 0.1 mg/dL (ref 0.0–0.3)
Total Bilirubin: 0.7 mg/dL (ref 0.2–1.2)
Total Protein: 6.7 g/dL (ref 6.0–8.3)

## 2016-02-20 LAB — BASIC METABOLIC PANEL
BUN: 15 mg/dL (ref 6–23)
CO2: 31 mEq/L (ref 19–32)
Calcium: 9.3 mg/dL (ref 8.4–10.5)
Chloride: 103 mEq/L (ref 96–112)
Creatinine, Ser: 0.93 mg/dL (ref 0.40–1.50)
GFR: 89.69 mL/min (ref >60.00)
Glucose, Bld: 102 mg/dL — ABNORMAL HIGH (ref 70–99)
Potassium: 4.1 mEq/L (ref 3.5–5.1)
Sodium: 140 mEq/L (ref 135–145)

## 2016-02-20 LAB — CBC WITH DIFFERENTIAL/PLATELET
Basophils Absolute: 0 K/uL (ref 0.0–0.1)
Basophils Relative: 0.4 % (ref 0.0–3.0)
Eosinophils Absolute: 0.1 K/uL (ref 0.0–0.7)
Eosinophils Relative: 3.5 % (ref 0.0–5.0)
HCT: 45.3 % (ref 39.0–52.0)
Hemoglobin: 15.8 g/dL (ref 13.0–17.0)
Lymphocytes Relative: 42.6 % (ref 12.0–46.0)
Lymphs Abs: 1.7 K/uL (ref 0.7–4.0)
MCHC: 34.8 g/dL (ref 30.0–36.0)
MCV: 93 fl (ref 78.0–100.0)
Monocytes Absolute: 0.3 K/uL (ref 0.1–1.0)
Monocytes Relative: 8.4 % (ref 3.0–12.0)
Neutro Abs: 1.8 K/uL (ref 1.4–7.7)
Neutrophils Relative %: 45.1 % (ref 43.0–77.0)
Platelets: 215 K/uL (ref 150.0–400.0)
RBC: 4.87 Mil/uL (ref 4.22–5.81)
RDW: 13.4 % (ref 11.5–15.5)
WBC: 3.9 K/uL — ABNORMAL LOW (ref 4.0–10.5)

## 2016-02-20 LAB — URINALYSIS, ROUTINE W REFLEX MICROSCOPIC
Bilirubin Urine: NEGATIVE
Hgb urine dipstick: NEGATIVE
Ketones, ur: NEGATIVE
Leukocytes, UA: NEGATIVE
Nitrite: NEGATIVE
RBC / HPF: NONE SEEN (ref 0–?)
Specific Gravity, Urine: <=1.005 — AB (ref 1.000–1.030)
Total Protein, Urine: NEGATIVE
Urine Glucose: NEGATIVE
Urobilinogen, UA: 0.2 (ref 0.0–1.0)
WBC, UA: NONE SEEN (ref 0–?)
pH: 6 (ref 5.0–8.0)

## 2016-02-20 LAB — LIPID PANEL
Cholesterol: 234 mg/dL — ABNORMAL HIGH (ref 0–200)
HDL: 56.6 mg/dL (ref >39.00)
LDL Cholesterol: 163 mg/dL — ABNORMAL HIGH (ref 0–99)
NonHDL: 177.86
Total CHOL/HDL Ratio: 4
Triglycerides: 74 mg/dL (ref 0.0–149.0)
VLDL: 14.8 mg/dL (ref 0.0–40.0)

## 2016-02-20 LAB — HEPATITIS C ANTIBODY: HCV Ab: NEGATIVE

## 2016-02-20 LAB — TSH: TSH: 1.24 u[IU]/mL (ref 0.35–4.50)

## 2016-02-20 LAB — PSA: PSA: 1.56 ng/mL (ref 0.10–4.00)

## 2016-02-20 MED ORDER — ZOLPIDEM TARTRATE 5 MG PO TABS: 5.0000 mg | ORAL_TABLET | Freq: Every evening | ORAL | 5 refills | Status: DC | PRN

## 2016-02-20 MED ORDER — TASIMELTEON 20 MG PO CAPS: ORAL_CAPSULE | ORAL | 5 refills | Status: DC

## 2016-02-20 MED ORDER — ATORVASTATIN CALCIUM 20 MG PO TABS: 20.0000 mg | ORAL_TABLET | Freq: Every day | ORAL | 3 refills | Status: DC

## 2016-02-20 NOTE — Telephone Encounter (Signed)
Ok for change to [facility] 5 qhs prn

## 2016-02-20 NOTE — Progress Notes (Signed)
Pre visit review using our clinic review tool, if applicable. No additional management support is needed unless otherwise documented below in the visit note. 

## 2016-02-20 NOTE — Telephone Encounter (Signed)
Wife left msg on triage stating the [facility]im[facility]te[facility] MD rx husband insurance does not cover. Requesting alternative to be sent to pharmacy...Johny Chess

## 2016-02-20 NOTE — Progress Notes (Signed)
Subjective:    Patient ID: Randy Moran, male    DOB: [birth year], 55 y.o.   MRN: [id number]  HPI  Here for wellness and f/u;  Overall doing ok;  Pt denies Chest pain, worsening SOB, DOE, wheezing, orthopnea, PND, worsening LE edema, palpitations, dizziness or syncope.  Pt denies neurological change such as new headache, facial or extremity weakness.  Pt denies polydipsia, polyuria, or low sugar symptoms. Pt states overall good compliance with treatment and medications, good tolerability, and has been trying to follow appropriate diet.  Pt denies worsening depressive symptoms, suicidal ideation or panic. No fever, night sweats, wt loss, loss of appetite, or other constitutional symptoms.  Pt states good ability with ADL's, has low fall risk, home safety reviewed and adequate, no other significant changes in hearing or vision, and only occasionally active with exercise.May have been checked for hep c last yr per rheum per pt prior to humira start. Sees Dr Veva Holes  Having significant difficulty with sleep, has variable work hours that makes regular sleep hours just about impossible.  Has intermittent days with marked fatigue. Sometimes only sleeps 4 hrs at at time Past Medical History:  Diagnosis Date  . Anxiety   . ANXIETY 03/22/2007   Qualifier: Diagnosis of  By: Jenny Reichmann MD, Hunt Oris   . Asthma    as a child  . DJD (degenerative joint disease)   . GERD (gastroesophageal reflux disease)   . Glaucoma left eye  . Hx of colonic polyps   . Hyperlipidemia   . Insomnia   . Lower back pain   . Migraines   . Myofascial pain syndrome   . Prostatitis   . Psoriatic arthritis ([place]) 03/04/2015  . Restless leg syndrome 08/25/2012   Past Surgical History:  Procedure Laterality Date  . APPENDECTOMY  1967  . bilateral inguinal herniorrhaphy     1960's  . bilateral shoulder surgery  2006, 2007,2010  . ganglion cyst surgery  2009  . HERNIA REPAIR  09/30/11   bilateral inguinal hernia  . knee  surgery x 2  2004,2008    reports that he has never smoked. He has never used smokeless tobacco. He reports that he does not drink alcohol or use drugs. family history includes Cancer in his maternal aunt, maternal uncle, and mother; Colon cancer in his maternal uncle and mother. Allergies  Allergen Reactions  . Prednisone Other (See Comments)    pred pak makes the pt very agitated--pt can tolerate the depo medrol injection  . Sulfamethoxazole-Trimethoprim     REACTION: rash  . Adhesive [Tape] Itching and Rash  . Levofloxacin Rash   Current Outpatient Prescriptions on File Prior to Visit  Medication Sig Dispense Refill  . acetaminophen (TYLENOL) 500 MG tablet Take 500 mg by mouth every 6 (six) hours as needed for mild pain, moderate pain or headache.     . eletriptan (RELPAX) 40 MG tablet Take 40 mg by mouth every 2 (two) hours as needed for migraine or headache. May repeat dose in 2 hours if necessary    . HUMIRA PEN 40 MG/0.8ML PNKT     . latanoprost (XALATAN) 0.005 % ophthalmic solution Place 1 drop into both eyes at bedtime.     . naproxen (NAPROSYN) 500 MG tablet Take 500 mg by mouth daily as needed for mild pain or moderate pain.     Marland Kitchen omeprazole (PRILOSEC) 20 MG capsule Take 20 mg by mouth daily as needed (heartburn).     . ondansetron (  ZOFRAN-ODT) 8 MG disintegrating tablet Take 8 mg by mouth every 6 (six) hours as needed for nausea or vomiting.     . pantoprazole (PROTONIX) 20 MG tablet Take 1 tablet (20 mg total) by mouth daily. 30 tablet 0  . rizatriptan (MAXALT) 10 MG tablet Take 10 mg by mouth every 2 (two) hours as needed for migraine. May repeat in 2 hours if needed    . [DISCONTINUED] chlorproMAZINE (THORAZINE) 10 MG tablet As needed for migraine headache      No current facility-administered medications on file prior to visit.    Review of Systems Constitutional: Negative for increased diaphoresis, or other activity, appetite or siginficant weight change other than  noted HENT: Negative for worsening hearing loss, ear pain, facial swelling, mouth sores and neck stiffness.   Eyes: Negative for other worsening pain, redness or visual disturbance.  Respiratory: Negative for choking or stridor Cardiovascular: Negative for other chest pain and palpitations.  Gastrointestinal: Negative for worsening diarrhea, blood in stool, or abdominal distention Genitourinary: Negative for hematuria, flank pain or change in urine volume.  Musculoskeletal: Negative for myalgias or other joint complaints.  Skin: Negative for other color change and wound or drainage.  Neurological: Negative for syncope and numbness. other than noted Hematological: Negative for adenopathy. or other swelling Psychiatric/Behavioral: Negative for hallucinations, SI, self-injury, decreased concentration or other worsening agitation.      Objective:   Physical Exam BP 118/72   Pulse 76   Temp 98.7 F (37.1 C) (Oral)   Resp 20   Wt 158 lb (71.7 kg)   SpO2 97%   BMI 23.33 kg/m  VS noted,  Constitutional: Pt is oriented to person, place, and time. Appears well-developed and well-nourished, in no significant distress Head: Normocephalic and atraumatic  Eyes: Conjunctivae and EOM are normal. Pupils are equal, round, and reactive to light Right Ear: External ear normal.  Left Ear: External ear normal Nose: Nose normal.  Mouth/Throat: Oropharynx is clear and moist  Neck: Normal range of motion. Neck supple. No JVD present. No tracheal deviation present or significant neck LA or mass Cardiovascular: Normal rate, regular rhythm, normal heart sounds and intact distal pulses.   Pulmonary/Chest: Effort normal and breath sounds without rales or wheezing  Abdominal: Soft. Bowel sounds are normal. NT. No HSM  Musculoskeletal: Normal range of motion. Exhibits no edema Lymphadenopathy: Has no cervical adenopathy.  Neurological: Pt is alert and oriented to person, place, and time. Pt has normal  reflexes. No cranial nerve deficit. Motor grossly intact Skin: Skin is warm and dry. No rash noted or new ulcers Psychiatric:  Has normal mood and affect. Behavior is normal.     Assessment & Plan:

## 2016-02-20 NOTE — Patient Instructions (Addendum)
You had the flu shot today  Please take all new medication as prescribed - the sleep medication  Please continue all other medications as before, and refills have been done if requested.  Please have the pharmacy call with any other refills you may need.  Please continue your efforts at being more active, low cholesterol diet, and weight control.  You are otherwise up to date with prevention measures today.  Please keep your appointments with your specialists as you may have planned  Please go to the LAB in the Basement (turn left off the elevator) for the tests to be done today  You will be contacted by phone if any changes need to be made immediately.  Otherwise, you will receive a letter about your results with an explanation, but please check with MyChart first.  Please remember to sign up for MyChart if you have not done so, as this will be important to you in the future with finding out test results, communicating by private email, and scheduling acute appointments online when needed.  Please return in 1 year for your yearly visit, or sooner if needed, with Lab testing done 3-5 days before

## 2016-02-21 NOTE — Assessment & Plan Note (Signed)
[place] for trial hetlioz,  to f/u any worsening symptoms or concerns

## 2016-02-21 NOTE — Assessment & Plan Note (Signed)

## 2016-02-23 NOTE — Telephone Encounter (Signed)
Rec'd call from [facility]. Verified if rx was received Friday. Per Maudie Mercury they have not received. Gave MD response on med change to Ambien. Called pt inform med has been called into the pharmacy...Randy Moran

## 2016-02-24 ENCOUNTER — Other Ambulatory Visit: Payer: Self-pay | Admitting: Internal Medicine

## 2016-02-24 MED ORDER — VALACYCLOVIR HCL 1 G PO TABS: 1000.0000 mg | ORAL_TABLET | Freq: Two times a day (BID) | ORAL | 3 refills | Status: DC

## 2016-03-24 NOTE — Progress Notes (Signed)
[name] [address] [place], [place] [address] Phone: [phone] Subjective:    I'm seeing this patient by the request  of:  Cathlean Cower, MD   CC: Shoulder pain  [id number]  Randy Moran is a 55 y.o. male coming in with complaint of shoulder pain. Patient past medical history is significant for psoriatic arthritis.Patient is complaining more of a right shoulder pain. Has been going on for multiple years. Unfortunate has had 3 surgeries on the shoulder 2 times for rotator cuff tear as well as acromioclavicular arthritis. Patient states that the pain is starting to worsen again. Has full range of motion but states that there is tightness. Patient feels like it is getting worse. Waking him up at night. No significant radiation down the arm but states that unfortunately chronic dull aching pain in the shoulder. Patient rates the severity of pain a 7 out of 10. Not responding to over-the-counter medications. No numbness in the hand.     Past Medical History:  Diagnosis Date  . Anxiety   . ANXIETY 03/22/2007   Qualifier: Diagnosis of  By: Jenny Reichmann MD, Hunt Oris   . Asthma    as a child  . DJD (degenerative joint disease)   . GERD (gastroesophageal reflux disease)   . Glaucoma left eye  . Hx of colonic polyps   . Hyperlipidemia   . Insomnia   . Lower back pain   . Migraines   . Myofascial pain syndrome   . Prostatitis   . Psoriatic arthritis ([place]) 03/04/2015  . Restless leg syndrome 08/25/2012   Past Surgical History:  Procedure Laterality Date  . APPENDECTOMY  1967  . bilateral inguinal herniorrhaphy     1960's  . bilateral shoulder surgery  2006, 2007,2010  . ganglion cyst surgery  2009  . HERNIA REPAIR  09/30/11   bilateral inguinal hernia  . knee surgery x 2  2004,2008   Social History   Social History  . Marital status: Married    Spouse name: Reynold Bowen  . Number of children: N/A  . Years of education: N/A   Occupational History    . 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 Smoking status: Never Smoker  . Smokeless tobacco: Never Used  . Alcohol use No  . Drug use: No  . Sexual activity: Not on file   Other Topics Concern  . Not on file   Social History Narrative   Work or School: [employer] - on call every few weeks      Home Situation: lives with wife      Spiritual Beliefs: Darrick Meigs, [place]      Lifestyle: walks 6 days per week; diet is fair               Allergies  Allergen Reactions  . Prednisone Other (See Comments)    pred pak makes the pt very agitated--pt can tolerate the depo medrol injection  . Sulfamethoxazole-Trimethoprim     REACTION: rash  . Adhesive [Tape] Itching and Rash  . Levofloxacin Rash   Family History  Problem Relation Age of Onset  . Coronary artery disease    . Hyperlipidemia    . Hypertension    . Cancer Mother     colon  . Colon cancer Mother   . Cancer Maternal Aunt     breast  . Cancer Maternal Uncle     prostate  . Colon cancer Maternal Uncle   .  Esophageal cancer Neg Hx   . Rectal cancer Neg Hx   . Stomach cancer Neg Hx     Past medical history, social, surgical and family history all reviewed in electronic medical record.  No pertanent information unless stated regarding to the chief complaint.   Review of Systems: No headache, visual changes, nausea, vomiting, diarrhea, constipation, dizziness, abdominal pain, skin rash, fevers, chills, night sweats, weight loss, swollen lymph nodes, body aches, joint swelling, muscle aches, chest pain, shortness of breath, mood changes.   Objective  There were no vitals taken for this visit.  General: No apparent distress alert and oriented x3 mood and affect normal, dressed appropriately.  HEENT: Pupils equal, extraocular movements intact  Respiratory: Patient's speak in full sentences and does not appear short of breath  Cardiovascular: No lower extremity edema, non tender, no erythema  Skin:  Warm dry intact with no signs of infection or rash on extremities or on axial skeleton.  Abdomen: Soft nontender  Neuro: Cranial nerves II through XII are intact, neurovascularly intact in all extremities with 2+ DTRs and 2+ pulses.  Lymph: No lymphadenopathy of posterior or anterior cervical chain or axillae bilaterally.  Gait normal with good balance and coordination.  MSK:  Non tender with full range of motion and good stability and symmetric strength and tone of  elbows, wrist, hip, knee and ankles bilaterally. Arthritic changes of multiple joints that seems worse then patients stated age.  Shoulder: Right Inspection reveals no abnormalities, atrophy or asymmetry. Palpation is normal with no tenderness over AC joint or bicipital groove. Lacks last 5 of 4 flexion as well as the last 10 of internal rotation. Crepitus with range of motion Rotator cuff strength normal throughout. signs of impingement with positive Neer and Hawkin's tests, but negative empty can sign. Speeds and Yergason's tests normal. No labral pathology noted with negative Obrien's, negative clunk and good stability. Normal scapular function observed. No painful arc and no drop arm sign. No apprehension sign  MSK US performed of: Right This study was ordered, performed, and interpreted by Charlann Boxer D.O.  Shoulder:   Supraspinatus:  Degenerative tearing noted with some mild atrophy. Appears to be intact though. Patient does have underlying osteophytic changes as well as what appears to be a bony erosion. Infraspinatus:  Appears normal on long and transverse views. Significant increase in Doppler flow Subscapularis anterior bone spur noted underneath the subscapularis tendon. Mild hypoechoic changes. Possible bony erosion noted at the insertion. Teres Minor:  Appears normal on long and transverse views. AC joint:  Post surgical changes noted Glenohumeral Joint:  Moderate arthritis Glenoid Labrum: Degenerative tearing  noted. Biceps Tendon:  Appears normal on long and transverse views, no fraying of tendon, tendon located in intertubercular groove, no subluxation with shoulder internal or external rotation.  Impression: Moderate osteophytic changes of the joint with possible erosions.     Impression and Recommendations:     This case required medical decision making of moderate complexity.      Note: This dictation was prepared with Dragon dictation along with smaller phrase technology. Any transcriptional errors that result from this process are unintentional.

## 2016-03-25 ENCOUNTER — Encounter: Payer: Self-pay | Admitting: Family Medicine

## 2016-03-25 ENCOUNTER — Ambulatory Visit (INDEPENDENT_AMBULATORY_CARE_PROVIDER_SITE_OTHER)
Admission: RE | Admit: 2016-03-25 | Discharge: 2016-03-25 | Disposition: A | Discharge status: Home / Self Care | Payer: 59 | Source: Ambulatory Visit | Attending: Family Medicine | Admitting: Family Medicine

## 2016-03-25 ENCOUNTER — Other Ambulatory Visit: Payer: Self-pay

## 2016-03-25 ENCOUNTER — Ambulatory Visit (INDEPENDENT_AMBULATORY_CARE_PROVIDER_SITE_OTHER): Payer: 59 | Admitting: Family Medicine

## 2016-03-25 VITALS — BP 122/82 | HR 91 | Wt 156.0 lb

## 2016-03-25 DIAGNOSIS — G8929 Other chronic pain: Secondary | ICD-10-CM

## 2016-03-25 DIAGNOSIS — M25511 Pain in right shoulder: Secondary | ICD-10-CM

## 2016-03-25 DIAGNOSIS — M13811 Other specified arthritis, right shoulder: Secondary | ICD-10-CM

## 2016-03-25 DIAGNOSIS — L4050 Arthropathic psoriasis, unspecified: Secondary | ICD-10-CM

## 2016-03-25 MED ORDER — DICLOFENAC SODIUM 2 % TD SOLN: TRANSDERMAL | 3 refills | Status: DC

## 2016-03-25 MED ORDER — VITAMIN D (ERGOCALCIFEROL) 1.25 MG (50000 UNIT) PO CAPS: 50000.0000 [IU] | ORAL_CAPSULE | ORAL | 0 refills | Status: DC

## 2016-03-25 MED ORDER — PREDNISONE 50 MG PO TABS: 50.0000 mg | ORAL_TABLET | Freq: Every day | ORAL | 0 refills | Status: DC

## 2016-03-25 NOTE — Patient Instructions (Signed)
Good to see you.  Ice 20 minutes 2 times daily. Usually after activity and before bed. pennsaid pinkie amount topically 2 times daily as needed.  Prednisone daily for 5 days.  Once weekly vitamin D for next 12 weeks can help with the pain and the psoriatic Get turmeric 500mg  twice daily to help as well.  Wear wrist brace day and night for 1 week then nightly for 2 weeks.  Xray downstairs See me again in 3 weeks to make sure we are making progress or if we need to inject the shoulder.

## 2016-03-25 NOTE — Assessment & Plan Note (Signed)
She does have findings that seemed to be consistent with more of a psoriatic arthritis of the right shoulder. Do believe that this will be moderate in severity. Patient will have x-rays. Vitamin D supplementation given to help with decreasing the progression of the disease. Topical anti-inflammatories written for. Home exercises given an icing regimen. We discussed which activities to do an which was to avoid. Patient then will come back and see me again in 3-4 weeks. If worsening symptoms we will consider injection.

## 2016-03-25 NOTE — Assessment & Plan Note (Signed)
Discussed with patient was seen the erosions noted on the bone possible need for different management area and patient will discuss with his rheumatologist.

## 2016-04-01 ENCOUNTER — Other Ambulatory Visit: Payer: 59

## 2016-04-01 DIAGNOSIS — M25511 Pain in right shoulder: Principal | ICD-10-CM

## 2016-04-01 DIAGNOSIS — G8929 Other chronic pain: Secondary | ICD-10-CM

## 2016-04-15 ENCOUNTER — Ambulatory Visit: Payer: 59 | Admitting: Family Medicine

## 2016-04-29 ENCOUNTER — Other Ambulatory Visit: Payer: Self-pay | Admitting: Family Medicine

## 2016-06-01 ENCOUNTER — Other Ambulatory Visit: Payer: Self-pay | Admitting: Family Medicine

## 2016-06-02 NOTE — Telephone Encounter (Signed)
Refill done.  

## 2016-06-11 ENCOUNTER — Telehealth: Payer: Self-pay | Admitting: Internal Medicine

## 2016-06-11 NOTE — Telephone Encounter (Signed)
OK 

## 2016-06-11 NOTE — Telephone Encounter (Signed)
Ok to transfer care per Dr Carlean Purl.

## 2016-06-22 NOTE — Telephone Encounter (Signed)
Patient has been scheduled for office visit 08/03/16 with Dr.Gessner

## 2016-06-29 ENCOUNTER — Other Ambulatory Visit: Payer: Self-pay | Admitting: Family Medicine

## 2016-06-29 NOTE — Telephone Encounter (Signed)
Refill done.  

## 2016-07-13 DIAGNOSIS — H401132 Primary open-angle glaucoma, bilateral, moderate stage: Secondary | ICD-10-CM | POA: Diagnosis not present

## 2016-07-14 ENCOUNTER — Ambulatory Visit: Payer: 59 | Admitting: Internal Medicine

## 2016-07-22 DIAGNOSIS — Z79899 Other long term (current) drug therapy: Secondary | ICD-10-CM | POA: Diagnosis not present

## 2016-07-22 DIAGNOSIS — M5136 Other intervertebral disc degeneration, lumbar region: Secondary | ICD-10-CM | POA: Diagnosis not present

## 2016-07-22 DIAGNOSIS — L4050 Arthropathic psoriasis, unspecified: Secondary | ICD-10-CM | POA: Diagnosis not present

## 2016-07-30 ENCOUNTER — Ambulatory Visit (INDEPENDENT_AMBULATORY_CARE_PROVIDER_SITE_OTHER): Payer: 59 | Admitting: Family Medicine

## 2016-07-30 ENCOUNTER — Ambulatory Visit: Payer: Self-pay

## 2016-07-30 ENCOUNTER — Encounter: Payer: Self-pay | Admitting: Family Medicine

## 2016-07-30 VITALS — BP 120/80 | HR 66 | Ht 69.0 in | Wt 158.0 lb

## 2016-07-30 DIAGNOSIS — G5751 Tarsal tunnel syndrome, right lower limb: Secondary | ICD-10-CM | POA: Diagnosis not present

## 2016-07-30 DIAGNOSIS — M25571 Pain in right ankle and joints of right foot: Secondary | ICD-10-CM

## 2016-07-30 MED ORDER — GABAPENTIN 100 MG PO CAPS: 200.0000 mg | ORAL_CAPSULE | Freq: Every day | ORAL | 3 refills | Status: DC

## 2016-07-30 NOTE — Patient Instructions (Signed)
head injury Ice 20 minutes 2 times daily. Usually after activity and before bed. Wear boot daily for next 2 weeks.  OK to come out of the boot if sitting but any walking wear the boot  Gabapentin at night Exercises 3 times a week.  Send message Monday or Tuesday if not better and we will get doppler.  Consider 1 aspirin 325mg  daily unless it hurts your stomach  See me again in 2-3 weeks.

## 2016-07-30 NOTE — Progress Notes (Signed)
[facility] [place],  [address] Phone: [phone] Subjective:    I'm seeing this patient by the request  of:    CC: Right ankle pain  [id number]  Randy Moran is a 56 y.o. male coming in with complaint of right ankle pain. Past medical history significant for psoriatic arthritis. States that he started having pain for quite some time. Medial aspect of the knee ankle. States that it seems to be worsening over the course of time. Patient states and feels that he can walk less than less. Mild numbness associated with it. Patient rates the pain is severe. Denies any injury. Better with rest. Sometimes can still wake him up in the morning.     Past Medical History:  Diagnosis Date  . Anxiety   . ANXIETY 03/22/2007   Qualifier: Diagnosis of  By: Jenny Reichmann MD, Hunt Oris   . Asthma    as a child  . DJD (degenerative joint disease)   . GERD (gastroesophageal reflux disease)   . Glaucoma left eye  . Hx of colonic polyps   . Hyperlipidemia   . Insomnia   . Lower back pain   . Migraines   . Myofascial pain syndrome   . Prostatitis   . Psoriatic arthritis ([place]) 03/04/2015  . Restless leg syndrome 08/25/2012   Past Surgical History:  Procedure Laterality Date  . APPENDECTOMY  1967  . bilateral inguinal herniorrhaphy     1960's  . bilateral shoulder surgery  2006, 2007,2010  . ganglion cyst surgery  2009  . HERNIA REPAIR  09/30/11   bilateral inguinal hernia  . knee surgery x 2  2004,2008   Social History   Social History  . Marital status: Married    Spouse name: Reynold Bowen  . Number of children: N/A  . Years of education: N/A   Occupational History  . 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 Smoking status: Never Smoker  . Smokeless tobacco: Never Used  . Alcohol use No  . Drug use: No  . Sexual activity: Not Asked   Other Topics Concern  . None   Social History Narrative   Work or School: [employer] - on call every few weeks      Home Situation: lives with wife      Spiritual Beliefs: Darrick Meigs, [place]      Lifestyle: walks 6 days per week; diet is fair               Allergies  Allergen Reactions  . Prednisone Other (See Comments)    pred pak makes the pt very agitated--pt can tolerate the depo medrol injection  . Sulfamethoxazole-Trimethoprim     REACTION: rash  . Adhesive [Tape] Itching and Rash  . Levofloxacin Rash   Family History  Problem Relation Age of Onset  . Coronary artery disease    . Hyperlipidemia    . Hypertension    . Cancer Mother     colon  . Colon cancer Mother   . Cancer Maternal Aunt     breast  . Cancer Maternal Uncle     prostate  . Colon cancer Maternal Uncle   . Esophageal cancer Neg Hx   . Rectal cancer Neg Hx   . Stomach cancer Neg Hx     Past medical history, social, surgical and family history all reviewed in electronic medical record.  No pertanent information unless stated regarding  to the chief complaint.   Review of Systems:Review of systems updated and as accurate as of 07/30/16  No headache, visual changes, nausea, vomiting, diarrhea, constipation, dizziness, abdominal pain, skin rash, fevers, chills, night sweats, weight loss, swollen lymph nodes,, chest pain, shortness of breath, mood changes. Positive muscle aches and body aches  Objective  Blood pressure 120/80, pulse 66, height 5\' 9"  (1.753 m), weight 158 lb (71.7 kg). Systems examined below as of 07/30/16   General: No apparent distress alert and oriented x3 mood and affect normal, dressed appropriately.  HEENT: Pupils equal, extraocular movements intact  Respiratory: Patient's speak in full sentences and does not appear short of breath  Cardiovascular: No lower extremity edema, non tender, no erythema  Skin: Warm dry intact with no signs of infection or rash on extremities or on axial skeleton.  Abdomen: Soft nontender  Neuro: Cranial nerves II through XII  are intact, neurovascularly intact in all extremities with 2+ DTRs and 2+ pulses.  Lymph: No lymphadenopathy of posterior or anterior cervical chain or axillae bilaterally.  Gait Antalgic gait  MSK:  Non tender with full range of motion and good stability and symmetric strength and tone of shoulders, elbows, wrist, hip, knee and bilaterally. Arthritic changes of multiple joints  Ankle: Right No visible erythema or swelling. Range of motion is full in all directions. 3+ out of 5 strength with inversion otherwise full strength Stable lateral and medial ligaments; squeeze test and kleiger test unremarkable; Talar dome nontender; No pain at base of 5th MT; No tenderness over cuboid; No tenderness over N spot or navicular prominence Severely tender to palpation over the posterior aspect of the medial malleolus No sign of peroneal tendon subluxations or tenderness to palpation Positive tarsal tunnel tinel's Able to walk 4 steps.  MSK US performed of: Right ankle This study was ordered, performed, and interpreted by Charlann Boxer D.O.  Foot/Ankle:    Right medial aspect the ankle shows the patient's posterior tibialis tendon seems to be in place. Patient though does have significant hypoechoic changes and increasing Doppler flow just posterior and inferior to the medial malleolus. This seems to be around the tarsal tunnel. Increasing Doppler flow over the posterior tibialis nerve. Patient's pain seemed to be compressible.  IMPRESSION:  Possible nerve entrapment, tarsal tunnel syndrome      Impression and Recommendations:     This case required medical decision making of moderate complexity.      Note: This dictation was prepared with Dragon dictation along with smaller phrase technology. Any transcriptional errors that result from this process are unintentional.

## 2016-07-30 NOTE — Assessment & Plan Note (Signed)
Patient does have more of a tarsal tunnel syndrome. New problem. Patient put in a Cam Walker. We discussed icing regimen and home exercises. Discussed which activities doing which ones to avoid. Encourage patient to increase activity as tolerated. Patient will do range of motion exercises more than strengthening at this time. We discussed once weekly vitamin D to help with any type of bone healing. We discussed the potential for injections. Patient declined today. Differential included potential did venous anastomosis with patient having psoriatic arthritis but seemed to be compressible today. Patient was given the option to potentially have in Doppler ultrasound done which patient declined. Patient will call if worsening symptoms otherwise will follow-up with me again in 2-3 weeks.

## 2016-08-02 ENCOUNTER — Encounter: Payer: Self-pay | Admitting: Family Medicine

## 2016-08-03 ENCOUNTER — Ambulatory Visit: Payer: 59 | Admitting: Internal Medicine

## 2016-08-06 ENCOUNTER — Ambulatory Visit (INDEPENDENT_AMBULATORY_CARE_PROVIDER_SITE_OTHER): Payer: 59 | Admitting: Internal Medicine

## 2016-08-06 ENCOUNTER — Encounter: Payer: Self-pay | Admitting: Internal Medicine

## 2016-08-06 VITALS — BP 122/76 | HR 84 | Temp 97.6°F | Ht 69.0 in | Wt 153.0 lb

## 2016-08-06 DIAGNOSIS — F419 Anxiety disorder, unspecified: Secondary | ICD-10-CM | POA: Diagnosis not present

## 2016-08-06 DIAGNOSIS — G43809 Other migraine, not intractable, without status migrainosus: Secondary | ICD-10-CM

## 2016-08-06 DIAGNOSIS — G4700 Insomnia, unspecified: Secondary | ICD-10-CM | POA: Diagnosis not present

## 2016-08-06 MED ORDER — ESCITALOPRAM OXALATE 10 MG PO TABS: 10.0000 mg | ORAL_TABLET | Freq: Every day | ORAL | 3 refills | Status: DC

## 2016-08-06 MED ORDER — ELETRIPTAN HYDROBROMIDE 40 MG PO TABS: 40.0000 mg | ORAL_TABLET | ORAL | 11 refills | Status: DC | PRN

## 2016-08-06 MED ORDER — LORAZEPAM 0.5 MG PO TABS: 0.5000 mg | ORAL_TABLET | Freq: Every day | ORAL | 5 refills | Status: DC

## 2016-08-06 NOTE — Assessment & Plan Note (Signed)
Ok for change ambien to ativan qhs prn,  to f/u any worsening symptoms or concerns

## 2016-08-06 NOTE — Assessment & Plan Note (Signed)
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

## 2016-08-06 NOTE — Patient Instructions (Signed)
Please take all new medication as prescribed - the ativan as needed for sleep, and the lexapro 10 mg - 1/2 - 1 tab in the AM  Please continue all other medications as before, and refills have been done if requested - the relpax  OK to stop the ambien  Please have the pharmacy call with any other refills you may need.  Please keep your appointments with your specialists as you may have planned

## 2016-08-06 NOTE — Progress Notes (Signed)
Subjective:    Patient ID: Randy Moran, male    DOB: [birth year], 56 y.o.   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   Has had ongoing anxiety but not this severe.  No other marital problems or family issues.  No ETOH use at all, no other drug use.  Denies worsening depressive symptoms, suicidal ideation, or panic  Somewhat less anxiety last wk with holding the ambien, would like to try change of this as well.  Pt denies chest pain, increased sob or doe, wheezing, orthopnea, PND, increased LE swelling, palpitations, dizziness or syncope.   Pt denies polydipsia, polyuria, No hx of thyroid dz  On average gets about 4 -5 hr sleep per day due to commute to work over 2 hrs each way Pt denies new neurological symptoms such as new headache, or facial or extremity weakness or numbness, but cannot tolerate preventive migraine meds, no longer sees [facility]y, asks for relpax refill for ongoin migraine. Lab Results  Component Value Date   TSH 1.24 02/20/2016   Past Medical History:  Diagnosis Date  . Anxiety   . ANXIETY 03/22/2007   Qualifier: Diagnosis of  By: Jenny Reichmann MD, Hunt Oris   . Asthma    as a child  . DJD (degenerative joint disease)   . GERD (gastroesophageal reflux disease)   . Glaucoma left eye  . Hx of colonic polyps   . Hyperlipidemia   . Insomnia   . Lower back pain   . Migraines   . Myofascial pain syndrome   . Prostatitis   . Psoriatic arthritis ([place]) 03/04/2015  . Restless leg syndrome 08/25/2012   Past Surgical History:  Procedure Laterality Date  . APPENDECTOMY  1967  . bilateral inguinal herniorrhaphy     1960's  . bilateral shoulder surgery  2006, 2007,2010  . ganglion cyst surgery  2009  . HERNIA REPAIR  09/30/11   bilateral inguinal hernia  . knee surgery x 2  2004,2008    reports that he has  never smoked. He has never used smokeless tobacco. He reports that he does not drink alcohol or use drugs. family history includes Cancer in his maternal aunt, maternal uncle, and mother; Colon cancer in his maternal uncle and mother. Allergies  Allergen Reactions  . Prednisone Other (See Comments)    pred pak makes the pt very agitated--pt can tolerate the depo medrol injection  . Sulfamethoxazole-Trimethoprim     REACTION: rash  . Adhesive [Tape] Itching and Rash  . Levofloxacin Rash   Current Outpatient Prescriptions on File Prior to Visit  Medication Sig Dispense Refill  . acetaminophen (TYLENOL) 500 MG tablet Take 500 mg by mouth every 6 (six) hours as needed for mild pain, moderate pain or headache.     Marland Kitchen atorvastatin (LIPITOR) 20 MG tablet Take 1 tablet (20 mg total) by mouth daily. 90 tablet 3  . Diclofenac Sodium (PENNSAID) 2 % SOLN Apply 1 pump twice daily. 112 g 3  . gabapentin (NEURONTIN) 100 MG capsule Take 2 capsules (200 mg total) by mouth at bedtime. 60 capsule 3  . HUMIRA PEN 40 MG/0.8ML PNKT     . latanoprost (XALATAN) 0.005 % ophthalmic solution Place 1 drop into both eyes at bedtime.     . naproxen (NAPROSYN) 500 MG tablet Take 500 mg  by mouth daily as needed for mild pain or moderate pain.     Marland Kitchen omeprazole (PRILOSEC) 20 MG capsule Take 20 mg by mouth daily as needed (heartburn).     . ondansetron (ZOFRAN-ODT) 8 MG disintegrating tablet Take 8 mg by mouth every 6 (six) hours as needed for nausea or vomiting.     . pantoprazole (PROTONIX) 20 MG tablet Take 1 tablet (20 mg total) by mouth daily. 30 tablet 0  . predniSONE (DELTASONE) 50 MG tablet Take 1 tablet (50 mg total) by mouth daily. 5 tablet 0  . rizatriptan (MAXALT) 10 MG tablet Take 10 mg by mouth every 2 (two) hours as needed for migraine. May repeat in 2 hours if needed    . Tasimelteon (HETLIOZ) 20 MG CAPS 1 by mouth at bedtime as needed 30 capsule 5  . valACYclovir (VALTREX) 1000 MG tablet Take 1 tablet (1,000  mg total) by mouth 2 (two) times daily. 20 tablet 3  . Vitamin D, Ergocalciferol, (DRISDOL) 50000 units CAPS capsule TAKE 1 TABLET ONCE A WEEK AS DIRECTED. 12 capsule 0  . zolpidem (AMBIEN) 5 MG tablet Take 1 tablet (5 mg total) by mouth at bedtime as needed for sleep. 30 tablet 5  . [DISCONTINUED] chlorproMAZINE (THORAZINE) 10 MG tablet As needed for migraine headache      No current facility-administered medications on file prior to visit.    Review of Systems  Constitutional: Negative for unusual diaphoresis or night sweats HENT: Negative for ear swelling or discharge Eyes: Negative for worsening visual haziness  Respiratory: Negative for choking and stridor.   Gastrointestinal: Negative for distension or worsening eructation Genitourinary: Negative for retention or change in urine volume.  Musculoskeletal: Negative for other MSK pain or swelling Skin: Negative for color change and worsening wound Neurological: Negative for tremors and numbness other than noted  Psychiatric/Behavioral: Negative for decreased concentration or agitation other than above   All other system neg per pt    Objective:   Physical Exam BP 122/76   Pulse 84   Temp 97.6 F (36.4 C)   Ht 5\' 9"  (1.753 m)   Wt 153 lb (69.4 kg)   SpO2 100%   BMI 22.59 kg/m  VS noted,  Constitutional: Pt appears in no apparent distress HENT: Head: NCAT.  Right Ear: External ear normal.  Left Ear: External ear normal.  Eyes: . Pupils are equal, round, and reactive to light. Conjunctivae and EOM are normal Neck: Normal range of motion. Neck supple.  Cardiovascular: Normal rate and regular rhythm.   Pulmonary/Chest: Effort normal and breath sounds without rales or wheezing.  Abd:  Soft, NT, ND, + BS Neurological: Pt is alert. Not confused , motor grossly intact Skin: Skin is warm. No rash, no LE edema Psychiatric: Pt behavior is normal. No agitation.  No other new exam findings    Assessment & Plan:

## 2016-08-06 NOTE — Assessment & Plan Note (Signed)
Overall stable, for relpax prn asd,  to f/u any worsening symptoms or concerns

## 2016-08-13 ENCOUNTER — Ambulatory Visit: Payer: 59 | Admitting: Internal Medicine

## 2016-08-13 ENCOUNTER — Telehealth: Payer: Self-pay | Admitting: Internal Medicine

## 2016-08-13 ENCOUNTER — Ambulatory Visit: Payer: 59 | Admitting: Family Medicine

## 2016-08-13 NOTE — Telephone Encounter (Signed)
No Charge 

## 2016-08-17 DIAGNOSIS — M5136 Other intervertebral disc degeneration, lumbar region: Secondary | ICD-10-CM | POA: Diagnosis not present

## 2016-08-17 DIAGNOSIS — M50322 Other cervical disc degeneration at C5-C6 level: Secondary | ICD-10-CM | POA: Diagnosis not present

## 2016-08-22 NOTE — Progress Notes (Signed)
[name] [place] [address], [place] [address] Phone: [phone] Subjective:    I'm seeing this patient by the request  of:    CC: Right ankle pain f/u   [id number]  Randy Moran is a 56 y.o. male coming in with complaint of right ankle pain. Past medical history significant for psoriatic arthritis.  Patient was having pain of the ankle that seemed to be more consistent with a tarsal tunnel syndrome. Patient was given exercises, we discussed over-the-counter orthotics, given topical anti-inflammatories. Patient states that he does seem to be doing better when he was in a [employer] for some time but then unfortunately seem to get worse for some time. Patient now states on the pain seems to be unrelenting. No swelling or redness but states that it feels like something is grinding inside his ankle. States it is bad enough that is stopped him from some daily activities.    Past Medical History:  Diagnosis Date  . Anxiety   . ANXIETY 03/22/2007   Qualifier: Diagnosis of  By: Jenny Reichmann MD, Hunt Oris   . Asthma    as a child  . DJD (degenerative joint disease)   . GERD (gastroesophageal reflux disease)   . Glaucoma left eye  . Hx of colonic polyps   . Hyperlipidemia   . Insomnia   . Lower back pain   . Migraines   . Myofascial pain syndrome   . Prostatitis   . Psoriatic arthritis ([place]) 03/04/2015  . Restless leg syndrome 08/25/2012   Past Surgical History:  Procedure Laterality Date  . APPENDECTOMY  1967  . bilateral inguinal herniorrhaphy     1960's  . bilateral shoulder surgery  2006, 2007,2010  . ganglion cyst surgery  2009  . HERNIA REPAIR  09/30/11   bilateral inguinal hernia  . knee surgery x 2  2004,2008   Social History   Social History  . Marital status: Married    Spouse name: Reynold Bowen  . Number of children: N/A  . Years of education: N/A   Occupational History  . 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  Smoking status: Never Smoker  . Smokeless tobacco: Never Used  . Alcohol use No  . Drug use: No  . Sexual activity: Not Asked   Other Topics Concern  . None   Social History Narrative   Work or School: [employer] - on call every few weeks      Home Situation: lives with wife      Spiritual Beliefs: Darrick Meigs, [place]      Lifestyle: walks 6 days per week; diet is fair               Allergies  Allergen Reactions  . Prednisone Other (See Comments)    pred pak makes the pt very agitated--pt can tolerate the depo medrol injection  . Sulfamethoxazole-Trimethoprim     REACTION: rash  . Adhesive [Tape] Itching and Rash  . Levofloxacin Rash   Family History  Problem Relation Age of Onset  . Cancer Mother     colon  . Colon cancer Mother   . Coronary artery disease    . Hyperlipidemia    . Hypertension    . Cancer Maternal Aunt     breast  . Cancer Maternal Uncle     prostate  . Colon cancer Maternal Uncle   . Esophageal cancer Neg Hx   . Rectal  cancer Neg Hx   . Stomach cancer Neg Hx     Past medical history, social, surgical and family history all reviewed in electronic medical record.  No pertanent information unless stated regarding to the chief complaint.   Review of Systems: No headache, visual changes, nausea, vomiting, diarrhea, constipation, dizziness, abdominal pain, skin rash, fevers, chills, night sweats, weight loss, swollen lymph nodes, body aches,, chest pain, shortness of breath, mood changes.  + muscle ache and joint swelling.   Objective  Blood pressure 108/72, pulse 75, height 5\' 9"  (1.753 m), weight 153 lb 8 oz (69.6 kg), SpO2 99 %.   Systems examined below as of 08/23/16 General: NAD A&O x3 mood, affect normal  HEENT: Pupils equal, extraocular movements intact no nystagmus Respiratory: not short of breath at rest or with speaking Cardiovascular: No lower extremity edema, non tender Skin: Warm dry intact with no signs of infection or rash on  extremities or on axial skeleton. Abdomen: Soft nontender, no masses Neuro: Cranial nerves  intact, neurovascularly intact in all extremities with 2+ DTRs and 2+ pulses. Lymph: No lymphadenopathy appreciated today  Gait Antalgic gait still noted.  MSK:  Non tender with full range of motion and good stability and symmetric strength and tone of shoulders, elbows, wrist, hip, knee and bilaterally. Arthritic changes of multiple joints  Ankle: Right No visible erythema or swelling. Range of motion is full in all directions. 4 out of 5 strength which is minimally improved. Talar dome nontender; No pain at base of 5th MT; No tenderness over cuboid; No tenderness over N spot or navicular prominence Severely tender to palpation over the posterior aspect of the medial malleolus still noted No sign of peroneal tendon subluxations or tenderness to palpation Positive tarsal tunnel tinel's Able to walk 4 steps.  Procedure: Real-time Ultrasound Guided Injection of tarsal tunnel Device: GE Logiq Q7 Ultrasound guided injection is preferred based studies that show increased duration, increased effect, greater accuracy, decreased procedural pain, increased response rate, and decreased cost with ultrasound guided versus blind injection.  Verbal informed consent obtained.  Time-out conducted.  Noted no overlying erythema, induration, or other signs of local infection.  Skin prepped in a sterile fashion.  Local anesthesia: Topical Ethyl chloride.  With sterile technique and under real time ultrasound guidance:  With a 25-gauge half-inch needle patient was injected with a total of 0.5 mL of 0.5% Marcaine and 0.5 mL of Kenalog 40 mg/dL within the posterior tibialis nerve sheath Completed without difficulty  Pain immediately resolved suggesting accurate placement of the medication.  Advised to call if fevers/chills, erythema, induration, drainage, or persistent bleeding.  Images permanently stored and available  for review in the ultrasound unit.  Impression: Technically successful ultrasound guided injection.      Impression and Recommendations:     This case required medical decision making of moderate complexity.      Note: This dictation was prepared with Dragon dictation along with smaller phrase technology. Any transcriptional errors that result from this process are unintentional.

## 2016-08-23 ENCOUNTER — Encounter: Payer: Self-pay | Admitting: Family Medicine

## 2016-08-23 ENCOUNTER — Ambulatory Visit (INDEPENDENT_AMBULATORY_CARE_PROVIDER_SITE_OTHER): Payer: 59 | Admitting: Family Medicine

## 2016-08-23 ENCOUNTER — Ambulatory Visit: Payer: Self-pay

## 2016-08-23 ENCOUNTER — Other Ambulatory Visit (INDEPENDENT_AMBULATORY_CARE_PROVIDER_SITE_OTHER): Payer: 59

## 2016-08-23 VITALS — BP 108/72 | HR 75 | Ht 69.0 in | Wt 153.5 lb

## 2016-08-23 DIAGNOSIS — G5751 Tarsal tunnel syndrome, right lower limb: Secondary | ICD-10-CM

## 2016-08-23 DIAGNOSIS — G8929 Other chronic pain: Secondary | ICD-10-CM

## 2016-08-23 DIAGNOSIS — M25571 Pain in right ankle and joints of right foot: Secondary | ICD-10-CM

## 2016-08-23 LAB — SEDIMENTATION RATE: Sed Rate: 3 mm/hr (ref 0–20)

## 2016-08-23 LAB — URIC ACID: Uric Acid, Serum: 5.8 mg/dL (ref 4.0–7.8)

## 2016-08-23 NOTE — Patient Instructions (Signed)
Good to see you  Ice 20 minutes 2 times daily. Usually after activity and before bed. Lets get a lab downstairs today  I hope the injections helps Start the exercises again on wed.  See you again in 3 weeks. If not better in 1 week call me and we will get a doppler done.

## 2016-08-23 NOTE — Assessment & Plan Note (Signed)
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 We discussed with patient about the possibility of differential including gout and laboratory workup was added today. Patient responded extremity well to the injection which make me hopeful. Possible EMG may be necessary if pain comes back medially. Discussed also the possibility of advanced imaging of the ankle if needed. Possible OCD likely secondary to more of a psoriatic arthritis. Patient is in agreement with the plan. Follow-up again in 2-4 weeks.

## 2016-08-27 ENCOUNTER — Telehealth: Payer: Self-pay | Admitting: Family Medicine

## 2016-08-27 ENCOUNTER — Other Ambulatory Visit: Payer: Self-pay

## 2016-08-27 DIAGNOSIS — M79669 Pain in unspecified lower leg: Secondary | ICD-10-CM

## 2016-08-27 NOTE — Telephone Encounter (Signed)
Spoke with referral coordinator who is working on scheduling patient and will call patient to schedule.

## 2016-08-27 NOTE — Telephone Encounter (Signed)
Spouse called back.  States patient text her to let her know that Mateo Flow had tried to call him but he can not answer phone at work.

## 2016-08-27 NOTE — Telephone Encounter (Signed)
States that patient's leg is not getting any better. States Dr. Tamala Julian had mentioned something about checking for blood clot in the leg.  Spouse would like order to be placed.  Patient can only get this done on Tuesday because of his work.  Is requesting urgent referral to be placed and a phone call back once done.

## 2016-08-31 ENCOUNTER — Ambulatory Visit (HOSPITAL_COMMUNITY)
Admission: RE | Admit: 2016-08-31 | Discharge: 2016-08-31 | Disposition: A | Discharge status: Home / Self Care | Payer: 59 | Source: Ambulatory Visit | Attending: Family Medicine | Admitting: Family Medicine

## 2016-08-31 DIAGNOSIS — M79661 Pain in right lower leg: Secondary | ICD-10-CM | POA: Diagnosis not present

## 2016-08-31 DIAGNOSIS — M79609 Pain in unspecified limb: Secondary | ICD-10-CM | POA: Diagnosis not present

## 2016-08-31 DIAGNOSIS — M79669 Pain in unspecified lower leg: Secondary | ICD-10-CM

## 2016-09-01 ENCOUNTER — Other Ambulatory Visit: Payer: Self-pay | Admitting: Internal Medicine

## 2016-09-01 NOTE — Telephone Encounter (Signed)
Done hardcopy to Shirron  

## 2016-09-03 ENCOUNTER — Other Ambulatory Visit: Payer: Self-pay | Admitting: Internal Medicine

## 2016-09-07 NOTE — Telephone Encounter (Signed)
ambien rx already done mar 21

## 2016-09-08 DIAGNOSIS — M5136 Other intervertebral disc degeneration, lumbar region: Secondary | ICD-10-CM | POA: Diagnosis not present

## 2016-09-08 NOTE — Telephone Encounter (Signed)
Rec'd call from pharmacy @ [facility] on status of [place]. Inform per chart rx was approved on 09/01/16 and faxed back to [facility]. She stated they did not receive script. Gave MD authorization from 3/21 to fill...Randy Moran

## 2016-09-14 ENCOUNTER — Ambulatory Visit: Payer: 59 | Admitting: Internal Medicine

## 2016-09-15 NOTE — Progress Notes (Signed)
[facility] [address] [place], [place] [address] Phone: [phone] Subjective:     CC: Right ankle pain f/u   [id number]  Randy Moran is a 56 y.o. male coming in with complaint of right ankle pain. Past medical history significant for psoriatic arthritis.  Patient was having pain of the ankle that seemed to be more consistent with a tarsal tunnel syndrome. Patient was not making significant improvement with conservative therapy and was seen 2 weeks ago and was given an injection. Patient states No significant improvement after the injection. Patient states that unfortunately the pain is suggested as bad. Patient did have a injection in his back recently but that did not make any significant improvement either. States that is now chronic. More activity does the worse it hurts.    Past Medical History:  Diagnosis Date  . Anxiety   . ANXIETY 03/22/2007   Qualifier: Diagnosis of  By: Jenny Reichmann MD, Hunt Oris   . Asthma    as a child  . DJD (degenerative joint disease)   . GERD (gastroesophageal reflux disease)   . Glaucoma left eye  . Hx of colonic polyps   . Hyperlipidemia   . Insomnia   . Lower back pain   . Migraines   . Myofascial pain syndrome   . Prostatitis   . Psoriatic arthritis ([place]) 03/04/2015  . Restless leg syndrome 08/25/2012   Past Surgical History:  Procedure Laterality Date  . APPENDECTOMY  1967  . bilateral inguinal herniorrhaphy     1960's  . bilateral shoulder surgery  2006, 2007,2010  . ganglion cyst surgery  2009  . HERNIA REPAIR  09/30/11   bilateral inguinal hernia  . knee surgery x 2  2004,2008   Social History   Social History  . Marital status: Married    Spouse name: Reynold Bowen  . Number of children: N/A  . Years of education: N/A   Occupational History  . 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 Smoking status: Never Smoker  . Smokeless tobacco: Never Used  . Alcohol use No  . Drug use:  No  . Sexual activity: Not Asked   Other Topics Concern  . None   Social History Narrative   Work or School: [employer] - on call every few weeks      Home Situation: lives with wife      Spiritual Beliefs: Darrick Meigs, [place]      Lifestyle: walks 6 days per week; diet is fair               Allergies  Allergen Reactions  . Prednisone Other (See Comments)    pred pak makes the pt very agitated--pt can tolerate the depo medrol injection  . Sulfamethoxazole-Trimethoprim     REACTION: rash  . Adhesive [Tape] Itching and Rash  . Levofloxacin Rash   Family History  Problem Relation Age of Onset  . Cancer Mother     colon  . Colon cancer Mother   . Coronary artery disease    . Hyperlipidemia    . Hypertension    . Cancer Maternal Aunt     breast  . Cancer Maternal Uncle     prostate  . Colon cancer Maternal Uncle   . Esophageal cancer Neg Hx   . Rectal cancer Neg Hx   . Stomach cancer Neg Hx     Past medical history, social, surgical and family history all  reviewed in electronic medical record.  No pertanent information unless stated regarding to the chief complaint.   Review of Systems: No headache, visual changes, nausea, vomiting, diarrhea, constipation, dizziness, abdominal pain, skin rash, fevers, chills, night sweats, weight loss, swollen lymph nodes, body aches, joint swelling, muscle aches, chest pain, shortness of breath, mood changes.    Objective  Blood pressure 112/82, pulse 62, height 5\' 9"  (1.753 m), weight 159 lb (72.1 kg).   Systems examined below as of 09/16/16 General: NAD A&O x3 mood, affect normal  HEENT: Pupils equal, extraocular movements intact no nystagmus Respiratory: not short of breath at rest or with speaking Cardiovascular: No lower extremity edema, non tender Skin: Warm dry intact with no signs of infection or rash on extremities or on axial skeleton. Abdomen: Soft nontender, no masses Neuro: Cranial nerves  intact, neurovascularly  intact in all extremities with 2+ DTRs and 2+ pulses. Lymph: No lymphadenopathy appreciated today  Gait normal with good balance and coordination.  MSK: Non tender with full range of motion and good stability and symmetric strength and tone of shoulders, elbows, wrist,  knee hips and bilaterally.    Ankle: Right No visible erythema or swelling. Range of motion is full in all directions. 4 out of 5 strength which is minimally improved. Talar dome nontender; No pain at base of 5th MT; No tenderness over cuboid; No tenderness over N spot or navicular prominence Severely tender to palpation over the posterior aspect of the medial malleolus still noted possibly worse then before.  No sign of peroneal tendon subluxations or tenderness to palpation Positive tarsal tunnel tinel's Able to walk 4 steps. Minimal change from previous exam and potentially worsening.     Impression and Recommendations:     This case required medical decision making of moderate complexity.      Note: This dictation was prepared with Dragon dictation along with smaller phrase technology. Any transcriptional errors that result from this process are unintentional.

## 2016-09-16 ENCOUNTER — Ambulatory Visit (INDEPENDENT_AMBULATORY_CARE_PROVIDER_SITE_OTHER): Payer: 59 | Admitting: Family Medicine

## 2016-09-16 ENCOUNTER — Other Ambulatory Visit (INDEPENDENT_AMBULATORY_CARE_PROVIDER_SITE_OTHER): Payer: 59

## 2016-09-16 ENCOUNTER — Ambulatory Visit (INDEPENDENT_AMBULATORY_CARE_PROVIDER_SITE_OTHER)
Admission: RE | Admit: 2016-09-16 | Discharge: 2016-09-16 | Disposition: A | Discharge status: Home / Self Care | Payer: 59 | Source: Ambulatory Visit | Attending: Family Medicine | Admitting: Family Medicine

## 2016-09-16 ENCOUNTER — Encounter: Payer: Self-pay | Admitting: Family Medicine

## 2016-09-16 VITALS — BP 112/82 | HR 62 | Ht 69.0 in | Wt 159.0 lb

## 2016-09-16 DIAGNOSIS — M25571 Pain in right ankle and joints of right foot: Secondary | ICD-10-CM

## 2016-09-16 DIAGNOSIS — M2550 Pain in unspecified joint: Secondary | ICD-10-CM | POA: Diagnosis not present

## 2016-09-16 DIAGNOSIS — G5751 Tarsal tunnel syndrome, right lower limb: Secondary | ICD-10-CM

## 2016-09-16 LAB — IBC PANEL
Iron: 164 ug/dL (ref 42–165)
Saturation Ratios: 42 % (ref 20.0–50.0)
Transferrin: 279 mg/dL (ref 212.0–360.0)

## 2016-09-16 MED ORDER — VENLAFAXINE HCL ER 37.5 MG PO CP24: 37.5000 mg | ORAL_CAPSULE | Freq: Every day | ORAL | 1 refills | Status: DC

## 2016-09-16 NOTE — Patient Instructions (Addendum)
Good to see you  Ice is your friend  Effexor 37.5 mg daily  We will get xray and labs today  We will do EMG of the ankle to esee if it is from the ankle or if it is from the back.  Depending on what we find we may need a MRI as well and we will discuss after the EMG.

## 2016-09-16 NOTE — Assessment & Plan Note (Signed)
Worsening symptoms. Patient is having more of a tarsal tunnel and believed. We will get an EMG for further evaluation. This also is exactly if there is more of a lumbar radiculopathy versus a potential nerve impingement. Started on Effexor today that I'll hopefully be beneficial as well. X-rays ordered today just in case we do need any advanced imaging such as an MRI. Patient will also have some labs done for further evaluate for any other organic causes a could cause more of a neuropathy. Patient will come back and see me again depending on the's test.

## 2016-09-16 NOTE — Progress Notes (Signed)
iron looks good awaiting vitamin D

## 2016-09-18 LAB — VITAMIN D 1,25 DIHYDROXY
Vitamin D 1, 25 (OH)2 Total: 43 pg/mL (ref 18–72)
Vitamin D2 1, 25 (OH)2: 25 pg/mL
Vitamin D3 1, 25 (OH)2: 18 pg/mL

## 2016-09-23 ENCOUNTER — Ambulatory Visit: Payer: 59 | Admitting: Internal Medicine

## 2016-09-23 ENCOUNTER — Other Ambulatory Visit: Payer: Self-pay | Admitting: *Deleted

## 2016-09-23 ENCOUNTER — Encounter: Payer: Self-pay | Admitting: Neurology

## 2016-09-23 DIAGNOSIS — M2550 Pain in unspecified joint: Secondary | ICD-10-CM

## 2016-10-02 DIAGNOSIS — M5136 Other intervertebral disc degeneration, lumbar region: Secondary | ICD-10-CM | POA: Diagnosis not present

## 2016-10-02 DIAGNOSIS — M50322 Other cervical disc degeneration at C5-C6 level: Secondary | ICD-10-CM | POA: Diagnosis not present

## 2016-10-12 ENCOUNTER — Encounter: Payer: 59 | Admitting: Neurology

## 2016-10-18 DIAGNOSIS — M50322 Other cervical disc degeneration at C5-C6 level: Secondary | ICD-10-CM | POA: Diagnosis not present

## 2016-10-18 DIAGNOSIS — M47816 Spondylosis without myelopathy or radiculopathy, lumbar region: Secondary | ICD-10-CM | POA: Diagnosis not present

## 2016-10-26 ENCOUNTER — Encounter: Payer: 59 | Admitting: Neurology

## 2016-10-26 ENCOUNTER — Encounter: Payer: Self-pay | Admitting: Diagnostic Neuroimaging

## 2016-10-26 ENCOUNTER — Ambulatory Visit (INDEPENDENT_AMBULATORY_CARE_PROVIDER_SITE_OTHER): Payer: 59 | Admitting: Diagnostic Neuroimaging

## 2016-10-26 VITALS — BP 121/72 | HR 91 | Ht 70.0 in | Wt 152.0 lb

## 2016-10-26 DIAGNOSIS — M5441 Lumbago with sciatica, right side: Secondary | ICD-10-CM

## 2016-10-26 DIAGNOSIS — M542 Cervicalgia: Secondary | ICD-10-CM

## 2016-10-26 NOTE — Patient Instructions (Signed)
Thank you for coming to see [place] at [facility]. I hope we have been able to provide you high quality care today.  You may receive a patient satisfaction survey over the next few weeks. We would appreciate your feedback and comments so that we may continue to improve ourselves and the health of our patients.  - I will refer you to [facility]   ~~~~~~~~~~~~~~~~~~~~~~~~~~~~~~~~~~~~~~~~~~~~~~~~~~~~~~~~~~~~~~~~~  DR. Tayron Hunnell'S GUIDE TO HAPPY AND HEALTHY LIVING These are some of my general health and wellness recommendations. Some of them may apply to you better than others. Please use common sense as you try these suggestions and feel free to ask me any questions.   ACTIVITY/FITNESS Mental, social, emotional and physical stimulation are very important for brain and body health. Try learning a new activity (arts, music, language, sports, games).  Keep moving your body to the best of your abilities. You can do this at home, inside or outside, the park, community center, gym or anywhere you like. Consider a physical therapist or personal trainer to get started. Consider the app Sworkit. Fitness trackers such as smart-watches, smart-phones or Fitbits can help as well.   NUTRITION Eat more plants: colorful vegetables, nuts, seeds and berries.  Eat less sugar, salt, preservatives and processed foods.  Avoid toxins such as cigarettes and alcohol.  Drink water when you are thirsty. Warm water with a slice of lemon is an excellent morning drink to start the day.  Consider these websites for more information The Nutrition Source (https://www.henry-hernandez.biz/) Precision Nutrition (WindowBlog.ch)   RELAXATION Consider practicing mindfulness meditation or other relaxation techniques such as deep breathing, prayer, yoga, tai chi, massage. See website mindful.org or the apps Headspace or Calm to help get  started.   SLEEP Try to get at least 7-8+ hours sleep per day. Regular exercise and reduced caffeine will help you sleep better. Practice good sleep hygeine techniques. See website sleep.org for more information.   PLANNING Prepare estate planning, living will, healthcare POA documents. Sometimes this is best planned with the help of an attorney. Theconversationproject.org and agingwithdignity.org are excellent resources.

## 2016-10-26 NOTE — Progress Notes (Signed)
[facility]  PATIENT: Randy Moran DOB: May 27, 1961  REFERRING CLINICIAN: D Brooks HISTORY FROM: patient and wife  REASON FOR VISIT: new consult    HISTORICAL  CHIEF COMPLAINT:  Chief Complaint  Patient presents with  . Pain    rm 6, New Pt, wife- Randy Moran, "pain in lower small of back, radiates down to right foot; occass pain in left leg; pain getting worse over past 5-6 months"     HISTORY OF PRESENT ILLNESS:   56 year old male with psoriatic arthritis, chronic neck pain, chronic back pain, here for evaluation of worsening neck and low back pain.  Patient has at least 20 years of unexplained low back pain. He had conservative management over the years with physical therapy, exercise, pain management, epidural steroid injections. He has had neck pain with tingling and pain across his neck and shoulders for the past 10 years, also unexplained, treated conservatively.  In 2014 patient had skin psoriasis rash, with generalized body aches, diagnosed with psoriatic arthritis. He was started on disease modifying therapy with Humira, with mild improvement in symptoms.  At the end of 2017 patient had increasing pain in his neck and low back. He was referred by rheumatologist to pain management specialist Dr. Nelva Bush. He was also evaluated by orthopedic surgeon Dr. Rolena Infante. MRI of the cervical and lumbar spine were obtained which showed no specific explanation for the degree of severe pain symptoms.  Of note patient has been working in a new job position for past 3 years, requiring 3-4 hours of commuting time per day, 12 hour shifts per day, 3 times per week. During his 3 days of work, sometimes he works in the day shift and sometimes worse in the night shift. He has significantly interrupted sleep during this time. He may average between 3 and 10 hours of sleep. Other days patient tries to catch up with his sleep averaging 78 hours per night. Patient takes low-dose  Ambien to help him with sleep.  Patient does not been to physical therapy recently. Patient tries to do some exercises at home on his own.  Patient is being considered for possible spinal cord stimulator by Dr. Nelva Bush.   REVIEW OF SYSTEMS: Full 14 system review of systems performed and negative with exception of: Insomnia shiftwork sleep and his headache weakness joint pain cramps aching muscles.   ALLERGIES: Allergies  Allergen Reactions  . Prednisone Other (See Comments)    pred pak makes the pt very agitated--pt can tolerate the depo medrol injection  . Sulfamethoxazole-Trimethoprim     REACTION: rash  . Adhesive [Tape] Itching and Rash  . Levofloxacin Rash    HOME MEDICATIONS: Outpatient Medications Prior to Visit  Medication Sig Dispense Refill  . acetaminophen (TYLENOL) 500 MG tablet Take 500 mg by mouth every 6 (six) hours as needed for mild pain, moderate pain or headache.     . Diclofenac Sodium (PENNSAID) 2 % SOLN Apply 1 pump twice daily. 112 g 3  . eletriptan (RELPAX) 40 MG tablet Take 1 tablet (40 mg total) by mouth every 2 (two) hours as needed for migraine or headache. May repeat dose in 2 hours if necessary 10 tablet 11  . HUMIRA PEN 40 MG/0.8ML PNKT     . latanoprost (XALATAN) 0.005 % ophthalmic solution Place 1 drop into both eyes at bedtime.     . naproxen (NAPROSYN) 500 MG tablet Take 500 mg by mouth daily as needed for mild pain or moderate pain.     Marland Kitchen  Vitamin D, Ergocalciferol, (DRISDOL) 50000 units CAPS capsule TAKE 1 TABLET ONCE A WEEK AS DIRECTED. 12 capsule 0  . zolpidem (AMBIEN) 5 MG tablet TAKE 1 TABLET AT BEDTIME IF NEEDED FOR SLEEP. 30 tablet 5  . Tasimelteon (HETLIOZ) 20 MG CAPS 1 by mouth at bedtime as needed 30 capsule 5  . venlafaxine XR (EFFEXOR XR) 37.5 MG 24 hr capsule Take 1 capsule (37.5 mg total) by mouth daily with breakfast. 30 capsule 1   No facility-administered medications prior to visit.     PAST MEDICAL HISTORY: Past Medical  History:  Diagnosis Date  . Anxiety   . ANXIETY 03/22/2007   Qualifier: Diagnosis of  By: Jenny Reichmann MD, Hunt Oris   . Asthma    as a child  . DJD (degenerative joint disease)   . GERD (gastroesophageal reflux disease)   . Glaucoma left eye  . Hx of colonic polyps   . Hyperlipidemia   . Insomnia   . Lower back pain   . Migraines    treated by [facility]  . Myofascial pain syndrome   . Prostatitis   . Psoriatic arthritis ([place]) 03/04/2015  . Restless leg syndrome 08/25/2012    PAST SURGICAL HISTORY: Past Surgical History:  Procedure Laterality Date  . APPENDECTOMY  1967  . bilateral inguinal herniorrhaphy     1960's  . bilateral shoulder surgery  2006, 2007,2010  . ganglion cyst surgery  2009  . HERNIA REPAIR  09/30/11   bilateral inguinal hernia  . knee surgery x 2  2004,2008    FAMILY HISTORY: Family History  Problem Relation Age of Onset  . Colon cancer Mother   . COPD Father   . Coronary artery disease Unknown   . Hyperlipidemia Unknown   . Hypertension Unknown   . Cancer Maternal Aunt        breast  . Cancer Maternal Uncle        prostate  . Colon cancer Maternal Uncle   . Esophageal cancer Neg Hx   . Rectal cancer Neg Hx   . Stomach cancer Neg Hx     SOCIAL HISTORY:  Social History   Social History  . Marital status: Married    Spouse name: Randy Moran  . Number of children: 1  . Years of education: 24   Occupational History  . 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 Smoking status: Never Smoker  . Smokeless tobacco: Never Used  . Alcohol use No  . Drug use: No  . Sexual activity: Not on file   Other Topics Concern  . Not on file   Social History Narrative   Work or School: [employer] - on call every few weeks      Home Situation: lives with wife      Spiritual Beliefs: Darrick Me[place], [place]      Lifestyle: walks 6 days per week; diet is fair      Caffeine- varies, some days I have none                 PHYSICAL  EXAM  GENERAL EXAM/CONSTITUTIONAL: Vitals:  Vitals:   10/26/16 1243  BP: 121/72  Pulse: 91  Weight: 152 lb (68.9 kg)  Height: 5\' 10"  (1.778 m)     Body mass index is 21.81 kg/m.  Visual Acuity Screening   Right eye Left eye Both eyes  Without correction:     With correction: 20/30 20/40      Patient is  in no distress; well developed, nourished and groomed; neck is supple  UNCOMFORTABLE APPEARING  WALKING AROUND EXAM ROOM, STRETCHING LEGS AND LOW BACK  CARDIOVASCULAR:  Examination of carotid arteries is normal; no carotid bruits  Regular rate and rhythm, no murmurs  Examination of peripheral vascular system by observation and palpation is normal  EYES:  Ophthalmoscopic exam of optic discs and posterior segments is normal; no papilledema or hemorrhages  MUSCULOSKELETAL:  Gait, strength, tone, movements noted in Neurologic exam below  NEUROLOGIC: MENTAL STATUS:  No flowsheet data found.  awake, alert, oriented to person, place and time  recent and remote memory intact  normal attention and concentration  language fluent, comprehension intact, naming intact,   fund of knowledge appropriate  CRANIAL NERVE:   2nd - no papilledema on fundoscopic exam  2nd, 3rd, 4th, 6th - pupils equal and reactive to light, visual fields full to confrontation, extraocular muscles intact, no nystagmus  5th - facial sensation symmetric  7th - facial strength symmetric  8th - hearing intact  9th - palate elevates symmetrically, uvula midline  11th - shoulder shrug symmetric  12th - tongue protrusion midline  MOTOR:   normal bulk and tone, full strength in the BUE, LLE  RLE LIMITED BY PAIN IN HF, KE, KF AND DF (4/5)  SENSORY:   normal and symmetric to light touch, temperature, vibration  COORDINATION:   finger-nose-finger, fine finger movements normal  REFLEXES:   deep tendon reflexes TRACE and symmetric  GAIT/STATION:   narrow based gait; able to  walk toes and heels    DIAGNOSTIC DATA (LABS, IMAGING, TESTING) - I reviewed patient records, labs, notes, testing and imaging myself where available.  Lab Results  Component Value Date   WBC 3.9 (L) 02/20/2016   HGB 15.8 02/20/2016   HCT 45.3 02/20/2016   MCV 93.0 02/20/2016   PLT 215.0 02/20/2016      Component Value Date/Time   NA 140 02/20/2016 0934   K 4.1 02/20/2016 0934   CL 103 02/20/2016 0934   CO2 31 02/20/2016 0934   GLUCOSE 102 (H) 02/20/2016 0934   BUN 15 02/20/2016 0934   CREATININE 0.93 02/20/2016 0934   CALCIUM 9.3 02/20/2016 0934   PROT 6.7 02/20/2016 0934   ALBUMIN 4.5 02/20/2016 0934   AST 23 02/20/2016 0934   ALT 37 02/20/2016 0934   ALKPHOS 43 02/20/2016 0934   BILITOT 0.7 02/20/2016 0934   GFRNONAA >60 01/29/2015 1220   GFRAA >60 01/29/2015 1220   Lab Results  Component Value Date   CHOL 234 (H) 02/20/2016   HDL 56.60 02/20/2016   LDLCALC 163 (H) 02/20/2016   LDLDIRECT 135.3 08/24/2012   TRIG 74.0 02/20/2016   CHOLHDL 4 02/20/2016   No results found for: HGBA1C No results found for: VITAMINB12 Lab Results  Component Value Date   TSH 1.24 02/20/2016    10/02/16 MRI cervical spine [I reviewed images myself and agree with interpretation. -VRP]  - At C6-7: Moderate to severe left and moderate right foraminal stenosis - At C4-5: Mild left neural foraminal narrowing  10/02/16 MRI lumbar spine [I reviewed images myself and agree with interpretation. -VRP]  - At L5-S1 small central right disc protrusion - lumbarization of S1 with small but fully developed S1-2 disc space    ASSESSMENT AND PLAN  56 y.o. year old male here with 20 years of chronic low back pain, 10 years of chronic neck pain, without specific cause found. Patient also has chronic psoriatic arthritis, generalized  body aches, on disease modifying therapy. Patient having worsening symptoms in the last 6 months. Patient also chronically sleep deprived, with rigorous commuting and  work schedule.  I reviewed patient's MRI scans, neurologic examination, charts, history. I do not have any specific alternate explanation of pain symptoms. I recommend to focus on improving and optimizing nutrition, fitness, muscle strengthening, flexibility, relaxation techniques.  Dx:  1. Low back pain with right-sided sciatica, unspecified back pain laterality, unspecified chronicity   2. Neck pain      PLAN: - no further neurologic testing advised at this time - defer to Dr. Nelva Bush for pain mgmt and spinal cord stimulator consideration - consider physical therapy, optimizing nutrition, sleep, relaxation techniques  Orders Placed This Encounter  Procedures  . Ambulatory referral to Physical Therapy   Return if symptoms worsen or fail to improve, for return to Dr. Rolena Infante and Dr. Nelva Bush.    Penni Bombard, MD 02/12/5175, 1:60 PM Certified in Neurology, Neurophysiology and Neuroimaging  [facility] [address] [place], [place] [address] [phone]

## 2016-10-27 ENCOUNTER — Ambulatory Visit: Payer: 59 | Admitting: Internal Medicine

## 2016-11-02 ENCOUNTER — Encounter: Payer: 59 | Admitting: Neurology

## 2016-11-02 DIAGNOSIS — M47816 Spondylosis without myelopathy or radiculopathy, lumbar region: Secondary | ICD-10-CM | POA: Diagnosis not present

## 2016-11-18 DIAGNOSIS — M79604 Pain in right leg: Secondary | ICD-10-CM | POA: Diagnosis not present

## 2016-11-18 DIAGNOSIS — M545 Low back pain: Secondary | ICD-10-CM | POA: Diagnosis not present

## 2016-11-18 DIAGNOSIS — M542 Cervicalgia: Secondary | ICD-10-CM | POA: Diagnosis not present

## 2016-11-26 DIAGNOSIS — M542 Cervicalgia: Secondary | ICD-10-CM | POA: Diagnosis not present

## 2016-11-26 DIAGNOSIS — M545 Low back pain: Secondary | ICD-10-CM | POA: Diagnosis not present

## 2016-11-26 DIAGNOSIS — M79604 Pain in right leg: Secondary | ICD-10-CM | POA: Diagnosis not present

## 2016-11-28 ENCOUNTER — Other Ambulatory Visit: Payer: Self-pay | Admitting: Family Medicine

## 2016-11-29 NOTE — Telephone Encounter (Signed)
Refill done.  

## 2016-12-13 DIAGNOSIS — M545 Low back pain: Secondary | ICD-10-CM | POA: Diagnosis not present

## 2016-12-13 DIAGNOSIS — M542 Cervicalgia: Secondary | ICD-10-CM | POA: Diagnosis not present

## 2016-12-13 DIAGNOSIS — M79604 Pain in right leg: Secondary | ICD-10-CM | POA: Diagnosis not present

## 2017-01-14 DIAGNOSIS — M25551 Pain in right hip: Secondary | ICD-10-CM | POA: Diagnosis not present

## 2017-01-20 DIAGNOSIS — M5136 Other intervertebral disc degeneration, lumbar region: Secondary | ICD-10-CM | POA: Diagnosis not present

## 2017-01-20 DIAGNOSIS — Z79899 Other long term (current) drug therapy: Secondary | ICD-10-CM | POA: Diagnosis not present

## 2017-01-20 DIAGNOSIS — L4050 Arthropathic psoriasis, unspecified: Secondary | ICD-10-CM | POA: Diagnosis not present

## 2017-01-24 DIAGNOSIS — M542 Cervicalgia: Secondary | ICD-10-CM | POA: Diagnosis not present

## 2017-01-24 DIAGNOSIS — M79604 Pain in right leg: Secondary | ICD-10-CM | POA: Diagnosis not present

## 2017-01-24 DIAGNOSIS — M545 Low back pain: Secondary | ICD-10-CM | POA: Diagnosis not present

## 2017-01-28 DIAGNOSIS — M25551 Pain in right hip: Secondary | ICD-10-CM | POA: Diagnosis not present

## 2017-02-03 DIAGNOSIS — G8929 Other chronic pain: Secondary | ICD-10-CM | POA: Diagnosis not present

## 2017-02-03 DIAGNOSIS — M5441 Lumbago with sciatica, right side: Secondary | ICD-10-CM | POA: Diagnosis not present

## 2017-02-08 ENCOUNTER — Ambulatory Visit (INDEPENDENT_AMBULATORY_CARE_PROVIDER_SITE_OTHER): Payer: 59 | Admitting: Neurology

## 2017-02-08 DIAGNOSIS — M2550 Pain in unspecified joint: Secondary | ICD-10-CM

## 2017-02-08 DIAGNOSIS — M79671 Pain in right foot: Secondary | ICD-10-CM

## 2017-02-08 NOTE — Procedures (Signed)
[facility]  [place], [place]  [place], [place] [address] Tel: [phone] Fax:  [phone] Test Date:  02/08/2017  Patient: Randy Moran DOB: 1960-10-06 Physician: Narda Amber, DO  Sex: Male Height: 5\' 9"  Ref Phys: Hulan Saas, M.D.  ID#: [id number] Temp: 36.5 Technician:    Patient Complaints: This is a 56 year-old man referred for evaluation of right ankle pain.  NCV & EMG Findings: Extensive electrodiagnostic testing of the right lower extremity shows:  1. Right sural, superficial peroneal, medial plantar and lateral plantar sensory responses are within normal limits. 2. Right peroneal and tibial (abductor hallucis and abductor digiti quinti minimi) is within normal limits. 3. Right tibial H reflex study is within normal limits. 4. Sparse chronic motor axon loss changes were isolated to the abductor hallucis muscle; in isolation, these are nonspecific findings and of uncertain clinical significance.    Impression: This is a normal study of the right lower extremity.   In particular, there is no evidence of a sensorimotor polyneuropathy, lumbosacral radiculopathy, or tarsal tunnel syndrome.   ___________________________ Narda Amber, DO    Nerve Conduction Studies Anti Sensory Summary Table   Stim Site NR Peak (ms) Norm Peak (ms) P-T Amp (V) Norm P-T Amp  Right Sup Peroneal Anti Sensory (Ant Lat Mall)  36.5C  12 cm    2.8 <4.6 8.2 >4  Right Sural Anti Sensory (Lat Mall)  36.5C  Calf    3.2 <4.6 15.8 >4   Motor Summary Table   Stim Site NR Onset (ms) Norm Onset (ms) O-P Amp (mV) Norm O-P Amp Site1 Site2 Delta-0 (ms) Dist (cm) Vel (m/s) Norm Vel (m/s)  Right Peroneal Motor (Ext Dig Brev)  36.5C  Ankle    3.1 <6.0 4.5 >2.5 B Fib Ankle 7.5 38.0 51 >40  B Fib    10.6  4.4  Poplt B Fib 1.6 8.0 50 >40  Poplt    12.2  4.2         Right Tibial Motor (Abd Hall Brev)  36.5C  Ankle    3.6 <6.0 15.7 >4 Knee Ankle 8.3 44.0 53 >40  Knee    11.9   12.8         Right Tibial (ADQP) Motor (ADQP)  36.5C  Ankle    4.3 <6.5 6.9 >3 Knee Ankle 8.3 44.0 53 >40  Knee    12.6  4.0          Mixed Summary Table   Stim Site NR Peak (ms) Norm Peak (ms) P-T Amp (V) Norm P-T Amp  Right Lateral Plantar Mixed (Med Malleolus)  36.5C  Lateral Foot    3.3 <3.7 11.1 >8  Right Medial Plantar Mixed (Med Malleolus)  36.5C  Medial Foot    2.9 <3.7 13.2 >8   H Reflex Studies   NR H-Lat (ms) Lat Norm (ms) L-R H-Lat (ms)  Right Tibial (Gastroc)  36.5C     33.47 <35    EMG   Side Muscle Ins Act Fibs Psw Fasc Number Recrt Dur Dur. Amp Amp. Poly Poly. Comment  Right AntTibialis Nml Nml Nml Nml Nml Nml Nml Nml Nml Nml Nml Nml N/A  Right Gastroc Nml Nml Nml Nml Nml Nml Nml Nml Nml Nml Nml Nml N/A  Right Flex Dig Long Nml Nml Nml Nml Nml Nml Nml Nml Nml Nml Nml Nml N/A  Right AbdHallucis Nml Nml Nml Nml 1- Mod-R Few 1+ Few 1+ Nml Nml N/A  Right RectFemoris Nml  Nml Nml Nml Nml Nml Nml Nml Nml Nml Nml Nml N/A  Right GluteusMed Nml Nml Nml Nml Nml Nml Nml Nml Nml Nml Nml Nml N/A  Right BicepsFemS Nml Nml Nml Nml Nml Nml Nml Nml Nml Nml Nml Nml N/A      Waveforms:

## 2017-02-10 DIAGNOSIS — M25551 Pain in right hip: Secondary | ICD-10-CM | POA: Diagnosis not present

## 2017-02-11 DIAGNOSIS — M50322 Other cervical disc degeneration at C5-C6 level: Secondary | ICD-10-CM | POA: Diagnosis not present

## 2017-02-11 DIAGNOSIS — M5136 Other intervertebral disc degeneration, lumbar region: Secondary | ICD-10-CM | POA: Diagnosis not present

## 2017-02-11 DIAGNOSIS — G894 Chronic pain syndrome: Secondary | ICD-10-CM | POA: Diagnosis not present

## 2017-02-15 ENCOUNTER — Other Ambulatory Visit: Payer: Self-pay | Admitting: Internal Medicine

## 2017-02-16 DIAGNOSIS — M545 Low back pain: Secondary | ICD-10-CM | POA: Diagnosis not present

## 2017-02-16 DIAGNOSIS — M542 Cervicalgia: Secondary | ICD-10-CM | POA: Diagnosis not present

## 2017-02-16 DIAGNOSIS — M79604 Pain in right leg: Secondary | ICD-10-CM | POA: Diagnosis not present

## 2017-02-24 ENCOUNTER — Encounter: Payer: 59 | Admitting: Internal Medicine

## 2017-02-25 ENCOUNTER — Other Ambulatory Visit (INDEPENDENT_AMBULATORY_CARE_PROVIDER_SITE_OTHER): Payer: 59

## 2017-02-25 ENCOUNTER — Ambulatory Visit (INDEPENDENT_AMBULATORY_CARE_PROVIDER_SITE_OTHER): Payer: 59 | Admitting: Internal Medicine

## 2017-02-25 ENCOUNTER — Encounter: Payer: Self-pay | Admitting: Internal Medicine

## 2017-02-25 VITALS — BP 116/76 | HR 75 | Temp 98.5°F | Ht 70.0 in | Wt 152.0 lb

## 2017-02-25 DIAGNOSIS — F419 Anxiety disorder, unspecified: Secondary | ICD-10-CM

## 2017-02-25 DIAGNOSIS — G4700 Insomnia, unspecified: Secondary | ICD-10-CM

## 2017-02-25 DIAGNOSIS — Z0001 Encounter for general adult medical examination with abnormal findings: Secondary | ICD-10-CM

## 2017-02-25 DIAGNOSIS — Z23 Encounter for immunization: Secondary | ICD-10-CM

## 2017-02-25 DIAGNOSIS — E785 Hyperlipidemia, unspecified: Secondary | ICD-10-CM

## 2017-02-25 LAB — CBC WITH DIFFERENTIAL/PLATELET
Basophils Absolute: 0 10*3/uL (ref 0.0–0.1)
Basophils Relative: 0.3 % (ref 0.0–3.0)
Eosinophils Absolute: 0.2 10*3/uL (ref 0.0–0.7)
Eosinophils Relative: 4.1 % (ref 0.0–5.0)
HCT: 43.5 % (ref 39.0–52.0)
Hemoglobin: 14.9 g/dL (ref 13.0–17.0)
Lymphocytes Relative: 49.8 % — ABNORMAL HIGH (ref 12.0–46.0)
Lymphs Abs: 2.2 10*3/uL (ref 0.7–4.0)
MCHC: 34.2 g/dL (ref 30.0–36.0)
MCV: 96.4 fl (ref 78.0–100.0)
Monocytes Absolute: 0.3 10*3/uL (ref 0.1–1.0)
Monocytes Relative: 6.6 % (ref 3.0–12.0)
Neutro Abs: 1.8 10*3/uL (ref 1.4–7.7)
Neutrophils Relative %: 39.2 % — ABNORMAL LOW (ref 43.0–77.0)
Platelets: 189 10*3/uL (ref 150.0–400.0)
RBC: 4.51 Mil/uL (ref 4.22–5.81)
RDW: 13.1 % (ref 11.5–15.5)
WBC: 4.5 10*3/uL (ref 4.0–10.5)

## 2017-02-25 LAB — URINALYSIS, ROUTINE W REFLEX MICROSCOPIC
Bilirubin Urine: NEGATIVE
Hgb urine dipstick: NEGATIVE
Ketones, ur: NEGATIVE
Leukocytes, UA: NEGATIVE
Nitrite: NEGATIVE
RBC / HPF: NONE SEEN (ref 0–?)
Specific Gravity, Urine: <=1.005 — AB (ref 1.000–1.030)
Total Protein, Urine: NEGATIVE
Urine Glucose: NEGATIVE
Urobilinogen, UA: 0.2 (ref 0.0–1.0)
WBC, UA: NONE SEEN (ref 0–?)
pH: 6 (ref 5.0–8.0)

## 2017-02-25 LAB — LIPID PANEL
Cholesterol: 225 mg/dL — ABNORMAL HIGH (ref 0–200)
HDL: 77.1 mg/dL (ref >39.00)
LDL Cholesterol: 133 mg/dL — ABNORMAL HIGH (ref 0–99)
NonHDL: 148.08
Total CHOL/HDL Ratio: 3
Triglycerides: 73 mg/dL (ref 0.0–149.0)
VLDL: 14.6 mg/dL (ref 0.0–40.0)

## 2017-02-25 LAB — BASIC METABOLIC PANEL
BUN: 11 mg/dL (ref 6–23)
CO2: 31 mEq/L (ref 19–32)
Calcium: 9.2 mg/dL (ref 8.4–10.5)
Chloride: 105 mEq/L (ref 96–112)
Creatinine, Ser: 0.96 mg/dL (ref 0.40–1.50)
GFR: 86.15 mL/min (ref >60.00)
Glucose, Bld: 86 mg/dL (ref 70–99)
Potassium: 4 mEq/L (ref 3.5–5.1)
Sodium: 141 mEq/L (ref 135–145)

## 2017-02-25 LAB — HEPATIC FUNCTION PANEL
ALT: 17 U/L (ref 0–53)
AST: 17 U/L (ref 0–37)
Albumin: 4.3 g/dL (ref 3.5–5.2)
Alkaline Phosphatase: 35 U/L — ABNORMAL LOW (ref 39–117)
Bilirubin, Direct: 0.1 mg/dL (ref 0.0–0.3)
Total Bilirubin: 0.5 mg/dL (ref 0.2–1.2)
Total Protein: 6.4 g/dL (ref 6.0–8.3)

## 2017-02-25 LAB — PSA: PSA: 1.46 ng/mL (ref 0.10–4.00)

## 2017-02-25 LAB — TSH: TSH: 1.76 u[IU]/mL (ref 0.35–4.50)

## 2017-02-25 MED ORDER — EZETIMIBE 10 MG PO TABS: 10.0000 mg | ORAL_TABLET | Freq: Every day | ORAL | 3 refills | Status: DC

## 2017-02-25 MED ORDER — PAROXETINE HCL 10 MG PO TABS: 10.0000 mg | ORAL_TABLET | Freq: Every day | ORAL | 3 refills | Status: DC

## 2017-02-25 MED ORDER — ZOLPIDEM TARTRATE 5 MG PO TABS: 5.0000 mg | ORAL_TABLET | Freq: Every evening | ORAL | 1 refills | Status: DC | PRN

## 2017-02-25 MED ORDER — ZOLPIDEM TARTRATE 10 MG PO TABS: 10.0000 mg | ORAL_TABLET | Freq: Every evening | ORAL | 1 refills | Status: DC | PRN

## 2017-02-25 MED ORDER — BUSPIRONE HCL 15 MG PO TABS: 15.0000 mg | ORAL_TABLET | Freq: Three times a day (TID) | ORAL | 5 refills | Status: DC | PRN

## 2017-02-25 NOTE — Assessment & Plan Note (Signed)
Mild to mod, for trazodone qhs prn,,  to f/u any worsening symptoms or concerns 

## 2017-02-25 NOTE — Assessment & Plan Note (Signed)

## 2017-02-25 NOTE — Patient Instructions (Addendum)
You had the flu shot today  Please take all new medication as prescribed - the paxil every day, and the Buspar only as needed for nerves, and the Zetia 10 mg daily for cholesterol  Ok to stop the ativan  Ok to increase the ambient to 10 mg   Please continue all other medications as before, and refills have been done if requested.  Please have the pharmacy call with any other refills you may need.  Please continue your efforts at being more active, low cholesterol diet, and weight control.  You are otherwise up to date with prevention measures today.  Please keep your appointments with your specialists as you may have planned  Please go to the LAB in the Basement (turn left off the elevator) for the tests to be done today  You will be contacted by phone if any changes need to be made immediately.  Otherwise, you will receive a letter about your results with an explanation, but please check with MyChart first.  Please remember to sign up for MyChart if you have not done so, as this will be important to you in the future with finding out test results, communicating by private email, and scheduling acute appointments online when needed.  Please return in 1 year for your yearly visit, or sooner if needed, with Lab testing done 3-5 days before

## 2017-02-25 NOTE — Assessment & Plan Note (Signed)
Mild to mod, for zetia 10 qd as ghas not been able to tolerate statins,  to f/u any worsening symptoms or concerns

## 2017-02-25 NOTE — Progress Notes (Signed)
Subjective:    Patient ID: Randy Moran, male    DOB: Feb 16, 1961, 56 y.o.   MRN: [id number]  HPI   Here for wellness and f/u;  Overall doing ok;  Pt denies Chest pain, worsening SOB, DOE, wheezing, orthopnea, PND, worsening LE edema, palpitations, dizziness or syncope.  Pt denies neurological change such as new headache, facial or extremity weakness.  Pt denies polydipsia, polyuria, or low sugar symptoms. Pt states overall good compliance with treatment and medications, good tolerability, and has been trying to follow appropriate diet.  Pt denies worsening depression or SI or HI.   No fever, night sweats, wt loss, loss of appetite, or other constitutional symptoms.  Pt states good ability with ADL's, has low fall risk, home safety reviewed and adequate, no other significant changes in hearing or vision, and only occasionally active with exercise.  Does have chronic poor sleep habits and nightly insomnia  where he lives near [place] but drives to near [place] daily and working over 10 hrs per day Pt continues to have recurring LBP without change in severity, bowel or bladder change, fever, wt loss,  worsening LE pain/numbness/weakness, gait change or falls, but pain persists so will have trial spinal cord stimulator sep t25.  Much stress at work but wants to try to retire from current job in 4 yrs (is Sports coach for [employer]).  Has not been taking the lpitor due to issues with fatigue he always seems to get and already has significnat issue with lack of rest hours due to job.  Past Medical History:  Diagnosis Date  . Anxiety   . ANXIETY 03/22/2007   Qualifier: Diagnosis of  By: Jenny Reichmann MD, Hunt Oris   . Asthma    as a child  . DJD (degenerative joint disease)   . GERD (gastroesophageal reflux disease)   . Glaucoma left eye  . Hx of colonic polyps   . Hyperlipidemia   . Insomnia   . Lower back pain   . Migraines    treated by [facility]  . Myofascial pain syndrome   .  Prostatitis   . Psoriatic arthritis ([place]) 03/04/2015  . Restless leg syndrome 08/25/2012   Past Surgical History:  Procedure Laterality Date  . APPENDECTOMY  1967  . bilateral inguinal herniorrhaphy     1960's  . bilateral shoulder surgery  2006, 2007,2010  . ganglion cyst surgery  2009  . HERNIA REPAIR  09/30/11   bilateral inguinal hernia  . knee surgery x 2  2004,2008    reports that he has never smoked. He has never used smokeless tobacco. He reports that he does not drink alcohol or use drugs. family history includes COPD in his father; Cancer in his maternal aunt and maternal uncle; Colon cancer in his maternal uncle and mother; Coronary artery disease in his unknown relative; Hyperlipidemia in his unknown relative; Hypertension in his unknown relative. Allergies  Allergen Reactions  . Prednisone Other (See Comments)    pred pak makes the pt very agitated--pt can tolerate the depo medrol injection  . Statins Other (See Comments)    Fatigue and weakness  . Sulfamethoxazole-Trimethoprim     REACTION: rash  . Adhesive [Tape] Itching and Rash  . Levofloxacin Rash   Current Outpatient Prescriptions on File Prior to Visit  Medication Sig Dispense Refill  . acetaminophen (TYLENOL) 500 MG tablet Take 500 mg by mouth every 6 (six) hours as needed for mild pain, moderate pain or headache.     Marland Kitchen  Diclofenac Sodium (PENNSAID) 2 % SOLN Apply 1 pump twice daily. 112 g 3  . eletriptan (RELPAX) 40 MG tablet Take 1 tablet (40 mg total) by mouth every 2 (two) hours as needed for migraine or headache. May repeat dose in 2 hours if necessary 10 tablet 11  . HUMIRA PEN 40 MG/0.8ML PNKT     . latanoprost (XALATAN) 0.005 % ophthalmic solution Place 1 drop into both eyes at bedtime.     . naproxen (NAPROSYN) 500 MG tablet Take 500 mg by mouth daily as needed for mild pain or moderate pain.     . [DISCONTINUED] chlorproMAZINE (THORAZINE) 10 MG tablet As needed for migraine headache      No current  facility-administered medications on file prior to visit.    Review of Systems  Constitutional: Negative for other unusual diaphoresis or sweats HENT: Negative for ear discharge or swelling Eyes: Negative for other worsening visual disturbances Respiratory: Negative for stridor or other swelling  Gastrointestinal: Negative for worsening distension or other blood Genitourinary: Negative for retention or other urinary change Musculoskeletal: Negative for other MSK pain or swelling Skin: Negative for color change or other new lesions Neurological: Negative for worsening tremors and other numbness  Psychiatric/Behavioral: Negative for worsening agitation or other fatigue All other system neg per pt    Objective:   Physical Exam BP 116/76   Pulse 75   Temp 98.5 F (36.9 C) (Oral)   Ht 5\' 10"  (1.778 m)   Wt 152 lb (68.9 kg)   SpO2 100%   BMI 21.81 kg/m  VS noted,  Constitutional: Pt appears in NAD HENT: Head: NCAT.  Right Ear: External ear normal.  Left Ear: External ear normal.  Eyes: . Pupils are equal, round, and reactive to light. Conjunctivae and EOM are normal Nose: without d/c or deformity Neck: Neck supple. Gross normal ROM Cardiovascular: Normal rate and regular rhythm.   Pulmonary/Chest: Effort normal and breath sounds without rales or wheezing.  Abd:  Soft, NT, ND, + BS, no organomegaly Neurological: Pt is alert. At baseline orientation, motor grossly intact Skin: Skin is warm. No rashes, other new lesions, no LE edema Psychiatric: Pt behavior is normal without agitation, 1-2+ nervous  No other exam findings  Lab Results  Component Value Date   WBC 4.5 02/25/2017   HGB 14.9 02/25/2017   HCT 43.5 02/25/2017   PLT 189.0 02/25/2017   GLUCOSE 86 02/25/2017   CHOL 225 (H) 02/25/2017   TRIG 73.0 02/25/2017   HDL 77.10 02/25/2017   LDLDIRECT 135.3 08/24/2012   LDLCALC 133 (H) 02/25/2017   ALT 17 02/25/2017   AST 17 02/25/2017   NA 141 02/25/2017   K 4.0  02/25/2017   CL 105 02/25/2017   CREATININE 0.96 02/25/2017   BUN 11 02/25/2017   CO2 31 02/25/2017   TSH 1.76 02/25/2017   PSA 1.46 02/25/2017        Assessment & Plan:

## 2017-02-25 NOTE — Assessment & Plan Note (Addendum)
mod, for paxil 10 qd,  to f/u any worsening symptoms or concerns  In addition to the time spent performing CPE, I spent an additional 15 minutes face to face,in which greater than 50% of this time was spent in counseling and coordination of care for patient's illness as documented, including the differential diagnosis, tx, further evaluation and other management of anxiety disorder, insomnia, and HLD in statin intolerant patient

## 2017-02-28 DIAGNOSIS — M1611 Unilateral primary osteoarthritis, right hip: Secondary | ICD-10-CM | POA: Diagnosis not present

## 2017-03-02 ENCOUNTER — Encounter: Payer: 59 | Admitting: Internal Medicine

## 2017-03-08 DIAGNOSIS — M5136 Other intervertebral disc degeneration, lumbar region: Secondary | ICD-10-CM | POA: Diagnosis not present

## 2017-03-08 DIAGNOSIS — M542 Cervicalgia: Secondary | ICD-10-CM | POA: Diagnosis not present

## 2017-03-08 DIAGNOSIS — G894 Chronic pain syndrome: Secondary | ICD-10-CM | POA: Diagnosis not present

## 2017-03-16 ENCOUNTER — Encounter: Payer: Self-pay | Admitting: Internal Medicine

## 2017-03-17 MED ORDER — LORAZEPAM 0.5 MG PO TABS: 0.5000 mg | ORAL_TABLET | Freq: Two times a day (BID) | ORAL | 2 refills | Status: DC | PRN

## 2017-03-17 NOTE — Addendum Note (Signed)
Addended by: Biagio Borg on: 03/17/2017 12:31 PM   Modules accepted: Orders

## 2017-03-17 NOTE — Telephone Encounter (Signed)
Done hardcopy to Shirron  

## 2017-03-21 DIAGNOSIS — M5127 Other intervertebral disc displacement, lumbosacral region: Secondary | ICD-10-CM | POA: Diagnosis not present

## 2017-03-24 ENCOUNTER — Other Ambulatory Visit: Payer: Self-pay | Admitting: Internal Medicine

## 2017-03-29 DIAGNOSIS — M5416 Radiculopathy, lumbar region: Secondary | ICD-10-CM | POA: Diagnosis not present

## 2017-03-29 DIAGNOSIS — M5417 Radiculopathy, lumbosacral region: Secondary | ICD-10-CM | POA: Diagnosis not present

## 2017-04-20 DIAGNOSIS — M5127 Other intervertebral disc displacement, lumbosacral region: Secondary | ICD-10-CM | POA: Diagnosis not present

## 2017-05-11 ENCOUNTER — Telehealth: Payer: Self-pay | Admitting: Vascular Surgery

## 2017-05-11 NOTE — Telephone Encounter (Signed)
-----   Message from Willy Eddy, RN sent at 05/11/2017  3:54 PM EST ----- Regarding: schedule and call patient  Please make patient an appointment with Dr. Donnetta Hutching. ALIF surgery date is 07/01/2017. Thank you, B

## 2017-05-11 NOTE — Telephone Encounter (Signed)
Sched appt 06/21/17 at 1:15. Lm on cell#.

## 2017-05-13 ENCOUNTER — Other Ambulatory Visit: Payer: Self-pay | Admitting: Neurological Surgery

## 2017-05-18 ENCOUNTER — Other Ambulatory Visit: Payer: Self-pay | Admitting: *Deleted

## 2017-06-16 ENCOUNTER — Other Ambulatory Visit: Payer: Self-pay | Admitting: *Deleted

## 2017-06-21 ENCOUNTER — Encounter: Payer: Self-pay | Admitting: Vascular Surgery

## 2017-06-21 ENCOUNTER — Ambulatory Visit (INDEPENDENT_AMBULATORY_CARE_PROVIDER_SITE_OTHER): Payer: 59 | Admitting: Vascular Surgery

## 2017-06-21 VITALS — BP 135/87 | HR 92 | Temp 99.2°F | Resp 16 | Ht 70.0 in | Wt 152.0 lb

## 2017-06-21 DIAGNOSIS — M5136 Other intervertebral disc degeneration, lumbar region: Secondary | ICD-10-CM | POA: Diagnosis not present

## 2017-06-21 NOTE — Progress Notes (Signed)
HISTORY AND PHYSICAL     CC:  Pre op for back surgery Requesting Provider:  Biagio Borg, MD  HPI: This is a 57 y.o. male who presents today for preoperative appointment.  He has been seeing Dr. Ellene Route for back pain.  The pt states that about 2-3 years ago he fell, but did not have any issues until about a year ago.   He states that he started having pain in his back, left hip and down his right leg to his foot.  He has tried multiple steroid injections.  He did get about 2 weeks of relief with the last injection but is now back to where he is having trouble walking.  He states he has had bilateral inguinal hernia repairs.   He is on Humira for psoriatic arthritis.  He takes gtts for glaucoma.  He is on Zetia for cholesterol.   He has never smoked and does not drink alcohol.    Past Medical History:  Diagnosis Date  . Anxiety   . ANXIETY 03/22/2007   Qualifier: Diagnosis of  By: Jenny Reichmann MD, Hunt Oris   . Asthma    as a child  . DJD (degenerative joint disease)   . GERD (gastroesophageal reflux disease)   . Glaucoma left eye  . Hx of colonic polyps   . Hyperlipidemia   . Insomnia   . Lower back pain   . Migraines    treated by [facility]  . Myofascial pain syndrome   . Prostatitis   . Psoriatic arthritis ([place]) 03/04/2015  . Restless leg syndrome 08/25/2012    Past Surgical History:  Procedure Laterality Date  . APPENDECTOMY  1967  . bilateral inguinal herniorrhaphy     1960's  . bilateral shoulder surgery  2006, 2007,2010  . ganglion cyst surgery  2009  . HERNIA REPAIR  09/30/11   bilateral inguinal hernia  . knee surgery x 2  [id number]    Allergies  Allergen Reactions  . Prednisone Other (See Comments)    pred pak makes the pt very agitated--pt can tolerate the depo medrol injection  . Statins Other (See Comments)    Fatigue and weakness  . Sulfamethoxazole-Trimethoprim     REACTION: rash  . Adhesive [Tape] Itching and Rash  . Levofloxacin Rash    Current  Outpatient Medications  Medication Sig Dispense Refill  . acetaminophen (TYLENOL) 500 MG tablet Take 500 mg by mouth every 6 (six) hours as needed for mild pain, moderate pain or headache.     . latanoprost (XALATAN) 0.005 % ophthalmic solution Place 1 drop into both eyes at bedtime.     . naproxen (NAPROSYN) 500 MG tablet Take 500 mg by mouth daily as needed for mild pain or moderate pain.     Marland Kitchen zolpidem (AMBIEN) 10 MG tablet Take 10 mg by mouth at bedtime.    . Diclofenac Sodium (PENNSAID) 2 % SOLN Apply 1 pump twice daily. (Patient not taking: Reported on 06/21/2017) 112 g 3  . eletriptan (RELPAX) 40 MG tablet Take 1 tablet (40 mg total) by mouth every 2 (two) hours as needed for migraine or headache. May repeat dose in 2 hours if necessary (Patient not taking: Reported on 06/21/2017) 10 tablet 11  . ezetimibe (ZETIA) 10 MG tablet Take 1 tablet (10 mg total) by mouth daily. (Patient not taking: Reported on 06/21/2017) 90 tablet 3  . HUMIRA PEN 40 MG/0.8ML PNKT     . LORazepam (ATIVAN) 0.5 MG tablet Take 1  tablet (0.5 mg total) by mouth 2 (two) times daily as needed for anxiety. (Patient not taking: Reported on 06/21/2017) 30 tablet 2  . PARoxetine (PAXIL) 10 MG tablet Take 1 tablet (10 mg total) by mouth daily. (Patient not taking: Reported on 06/21/2017) 90 tablet 3  . valACYclovir (VALTREX) 1000 MG tablet TAKE 1 TABLET TWICE A DAY (Patient not taking: Reported on 06/21/2017) 20 tablet 2  . zolpidem (AMBIEN) 10 MG tablet Take 1 tablet (10 mg total) by mouth at bedtime as needed for sleep. 90 tablet 1   No current facility-administered medications for this visit.     Family History  Problem Relation Age of Onset  . Colon cancer Mother   . COPD Father   . Coronary artery disease Unknown   . Hyperlipidemia Unknown   . Hypertension Unknown   . Cancer Maternal Aunt        breast  . Cancer Maternal Uncle        prostate  . Colon cancer Maternal Uncle   . Esophageal cancer Neg Hx   . Rectal cancer  Neg Hx   . Stomach cancer Neg Hx     Social History   Socioeconomic History  . Marital status: Married    Spouse name: [place]  . Number of children: 1  . Years of education: 100  . Highest education level: Not on file  Social Needs  . Financial resource strain: Not on file  . Food insecurity - worry: Not on file  . Food insecurity - inability: Not on file  . Transportation needs - medical: Not on file  . Transportation needs - non-medical: Not on file  Occupational History  . 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 Smoking status: Never Smoker  . Smokeless tobacco: Never Used  Substance and Sexual Activity  . Alcohol use: No  . Drug use: No  . Sexual activity: Not on file  Other Topics Concern  . Not on file  Social History Narrative   Work or School: [employer] - on call every few weeks      Home Situation: lives with wife      Spiritual Beliefs: Darrick Meigs, [place]      Lifestyle: walks 6 days per week; diet is fair      Caffeine- varies, some days I have none              REVIEW OF SYSTEMS:   [X]  denotes positive finding, [ ]  denotes negative finding Cardiac  Comments:  Chest pain or chest pressure:    Shortness of breath upon exertion:    Short of breath when lying flat:    Irregular heart rhythm:        Vascular    Pain in calf, thigh, or hip brought on by ambulation:    Pain in feet at night that wakes you up from your sleep:     Blood clot in your veins:    Leg swelling:         Pulmonary    Oxygen at home:    Productive cough:     Wheezing:         Neurologic    Sudden weakness in arms or legs:     Sudden numbness in arms or legs:     Sudden onset of difficulty speaking or slurred speech:    Temporary loss of vision in one eye:     Problems with dizziness:  Gastrointestinal    Blood in stool:     Vomited blood:         Genitourinary    Burning when urinating:     Blood in urine:        Psychiatric     Major depression:         Hematologic    Bleeding problems:    Problems with blood clotting too easily:        Skin    Rashes or ulcers:        Constitutional    Fever or chills:      PHYSICAL EXAMINATION:  Vitals:   06/21/17 1302  BP: 135/87  Pulse: 92  Resp: 16  Temp: 99.2 F (37.3 C)  SpO2: 97%   Vitals:   06/21/17 1302  Weight: 152 lb (68.9 kg)  Height: 5\' 10"  (1.778 m)   Body mass index is 21.81 kg/m.  General:  WDWN in NAD; vital signs documented above Gait: Not observed HENT: WNL, normocephalic Pulmonary: normal non-labored breathing , without Rales, rhonchi,  wheezing Cardiac: regular HR, without  Murmurs without carotid bruits Abdomen: soft, NT, no masses Skin: without rashes Vascular Exam/Pulses:  Right Left  Radial 2+ (normal) 2+ (normal)  Popliteal 2+ (normal) 2+ (normal)  DP 2+ (normal) 2+ (normal)   Extremities: without ischemic changes, without Gangrene , without cellulitis; without open wounds;  Musculoskeletal: no muscle wasting or atrophy  Neurologic: A&O X 3;  No focal weakness or paresthesias are detected Psychiatric:  The pt has Normal affect.   Non-Invasive Vascular Imaging:   none  Pt meds includes: Statin:  No. Beta Blocker:  No. Aspirin:  No. ACEI:  No. ARB:  No. CCB use:  No Other Antiplatelet/Anticoagulant:  No   ASSESSMENT/PLAN:: 57 y.o. male with degenerative changes to L5/S1 in need of ALIF   -pt for surgery with Dr. Ellene Route for ALIF and exposure by Dr. Donnetta Hutching.  Dr. Donnetta Hutching reviewed previous CT scan and pt does not have any atherosclerosis of his iliac vessels.  He has had previous inguinal hernia repair bilaterally with mesh.   -Dr. Donnetta Hutching discussed surgery, risks and benefits with pt and family member.  Questions were answered.     Leontine Locket, PA-C [facility] [phone]  Clinic MD:  Pt seen and examined with Dr. Donnetta Hutching  I have examined the patient, reviewed and agree with above.  I  discussed my role for exposure with the patient and his wife present.  Explained mobilization of the rectus sheath, rectus muscle, intraperitoneal contents, left ureter and arterial and venous structures over the spine.  Explained the potential injury to all of these.  He understands and wishes to proceed.  He has had bilateral inguinal hernia repairs that explained that this may require some additional mobilization around the mesh but would not see this is a prohibitive risk.  We will plan with surgery as scheduled on 07/01/2017 Curt Jews, MD 06/21/2017 2:13 PM

## 2017-06-23 ENCOUNTER — Encounter (HOSPITAL_COMMUNITY): Payer: Self-pay

## 2017-06-23 NOTE — Pre-Procedure Instructions (Signed)
Randy Moran  06/23/2017      [place], [place] [address] Phone: [phone] Fax: [phone]  [id number] [address] [place] [address] Phone: [phone] Fax: [phone]    Your procedure is scheduled on Friday January 18.  Report to [facility] Admitting at 5:30 A.M.  Call this number if you have problems the morning of surgery:  [phone]   Remember:  Do not eat food or drink liquids after midnight.  Take these medicines the morning of surgery with A SIP OF WATER: acetaminophen (tylenol) if needed  7 days prior to surgery STOP taking any Aspirin(unless otherwise instructed by your surgeon), Aleve, Naproxen, Ibuprofen, Motrin, Advil, Goody's, BC's, all herbal medications, fish oil, and all vitamins    Do not wear jewelry, make-up or nail polish.  Do not wear lotions, powders, or perfumes, or deodorant.  Do not shave 48 hours prior to surgery.  Men may shave face and neck.  Do not bring valuables to the hospital.  [facility] is not responsible for any belongings or valuables.  Contacts, dentures or bridgework may not be worn into surgery.  Leave your suitcase in the car.  After surgery it may be brought to your room.  For patients admitted to the hospital, discharge time will be determined by your treatment team.  Patients discharged the day of surgery will not be allowed to drive home.   Special instructions:    Randy Moran- Preparing For Surgery  Before surgery, you can play an important role. Because skin is not sterile, your skin needs to be as free of germs as possible. You can reduce the number of germs on your skin by washing with CHG (chlorahexidine gluconate) Soap before surgery.  CHG is an antiseptic cleaner which kills germs and bonds with the skin to continue killing germs even after washing.  Please  do not use if you have an allergy to CHG or antibacterial soaps. If your skin becomes reddened/irritated stop using the CHG.  Do not shave (including legs and underarms) for at least 48 hours prior to first CHG shower. It is OK to shave your face.  Please follow these instructions carefully.   1. Shower the NIGHT BEFORE SURGERY and the MORNING OF SURGERY with CHG.   2. If you chose to wash your hair, wash your hair first as usual with your normal shampoo.  3. After you shampoo, rinse your hair and body thoroughly to remove the shampoo.  4. Use CHG as you would any other liquid soap. You can apply CHG directly to the skin and wash gently with a scrungie or a clean washcloth.   5. Apply the CHG Soap to your body ONLY FROM THE NECK DOWN.  Do not use on open wounds or open sores. Avoid contact with your eyes, ears, mouth and genitals (private parts). Wash Face and genitals (private parts)  with your normal soap.  6. Wash thoroughly, paying special attention to the area where your surgery will be performed.  7. Thoroughly rinse your body with warm water from the neck down.  8. DO NOT shower/wash with your normal soap after using and rinsing off the CHG Soap.  9. Pat yourself dry with a CLEAN TOWEL.  10. Wear CLEAN PAJAMAS to bed the night before surgery, wear comfortable clothes the morning of surgery  11. Place  CLEAN SHEETS on your bed the night of your first shower and DO NOT SLEEP WITH PETS.    Day of Surgery: Do not apply any deodorants/lotions. Please wear clean clothes to the hospital/surgery center.      Please read over the following fact sheets that you were given. Coughing and Deep Breathing, MRSA Information and Surgical Site Infection Prevention

## 2017-06-24 ENCOUNTER — Encounter (HOSPITAL_COMMUNITY)
Admission: RE | Admit: 2017-06-24 | Discharge: 2017-06-24 | Disposition: A | Discharge status: Home / Self Care | Payer: 59 | Source: Ambulatory Visit | Attending: Neurological Surgery | Admitting: Neurological Surgery

## 2017-06-24 ENCOUNTER — Encounter (HOSPITAL_COMMUNITY): Payer: Self-pay

## 2017-06-24 DIAGNOSIS — Z01818 Encounter for other preprocedural examination: Secondary | ICD-10-CM | POA: Insufficient documentation

## 2017-06-24 LAB — BASIC METABOLIC PANEL
Anion gap: 8 (ref 5–15)
BUN: 10 mg/dL (ref 6–20)
CO2: 26 mmol/L (ref 22–32)
Calcium: 9.1 mg/dL (ref 8.9–10.3)
Chloride: 108 mmol/L (ref 101–111)
Creatinine, Ser: 1.02 mg/dL (ref 0.61–1.24)
GFR calc Af Amer: >60 mL/min (ref >60)
GFR calc non Af Amer: >60 mL/min (ref >60)
Glucose, Bld: 96 mg/dL (ref 65–99)
Potassium: 3.9 mmol/L (ref 3.5–5.1)
Sodium: 142 mmol/L (ref 135–145)

## 2017-06-24 LAB — CBC
HCT: 42.9 % (ref 39.0–52.0)
Hemoglobin: 14.5 g/dL (ref 13.0–17.0)
MCH: 31.8 pg (ref 26.0–34.0)
MCHC: 33.8 g/dL (ref 30.0–36.0)
MCV: 94.1 fL (ref 78.0–100.0)
Platelets: 185 10*3/uL (ref 150–400)
RBC: 4.56 MIL/uL (ref 4.22–5.81)
RDW: 12.1 % (ref 11.5–15.5)
WBC: 4.8 10*3/uL (ref 4.0–10.5)

## 2017-06-24 LAB — TYPE AND SCREEN
ABO/RH(D): A POS
Antibody Screen: NEGATIVE

## 2017-06-24 LAB — SURGICAL PCR SCREEN
MRSA, PCR: NEGATIVE
Staphylococcus aureus: NEGATIVE

## 2017-06-24 LAB — ABO/RH: ABO/RH(D): A POS

## 2017-06-24 NOTE — Progress Notes (Signed)
PCP - Cathlean Cower  Pt denies cardiac history or cardiac workup.  Pt last dose of Humira 06/04/17. To hold off prior to surgery per patient.   Patient denies shortness of breath, fever, cough and chest pain at PAT appointment   Patient verbalized understanding of instructions that were given to them at the PAT appointment. Patient was also instructed that they will need to review over the PAT instructions again at home before surgery.

## 2017-06-30 NOTE — Anesthesia Preprocedure Evaluation (Addendum)
Anesthesia Evaluation  Patient identified by MRN, date of birth, ID band Patient awake    Reviewed: Allergy & Precautions, NPO status , Patient's Chart, lab work & pertinent test results  Airway Mallampati: II  TM Distance: >3 FB Neck ROM: Full    Dental no notable dental hx.    Pulmonary neg pulmonary ROS,    Pulmonary exam normal breath sounds clear to auscultation       Cardiovascular negative cardio ROS Normal cardiovascular exam Rhythm:Regular Rate:Normal     Neuro/Psych negative neurological ROS  negative psych ROS   GI/Hepatic negative GI ROS, Neg liver ROS,   Endo/Other  negative endocrine ROS  Renal/GU negative Renal ROS  negative genitourinary   Musculoskeletal negative musculoskeletal ROS (+)   Abdominal   Peds negative pediatric ROS (+)  Hematology negative hematology ROS (+)   Anesthesia Other Findings   Reproductive/Obstetrics negative OB ROS                             Lab Results  Component Value Date   WBC 4.8 06/24/2017   HGB 14.5 06/24/2017   HCT 42.9 06/24/2017   MCV 94.1 06/24/2017   PLT 185 06/24/2017   Lab Results  Component Value Date   CREATININE 1.02 06/24/2017   BUN 10 06/24/2017   NA 142 06/24/2017   K 3.9 06/24/2017   CL 108 06/24/2017   CO2 26 06/24/2017     Anesthesia Physical Anesthesia Plan  ASA: II  Anesthesia Plan: General   Post-op Pain Management:    Induction: Intravenous  PONV Risk Score and Plan: 1 and Treatment may vary due to age or medical condition, Ondansetron, Dexamethasone and Midazolam  Airway Management Planned: Oral ETT  Additional Equipment:   Intra-op Plan:   Post-operative Plan: Extubation in OR  Informed Consent: I have reviewed the patients History and Physical, chart, labs and discussed the procedure including the risks, benefits and alternatives for the proposed anesthesia with the patient or  authorized representative who has indicated his/her understanding and acceptance.   Dental advisory given  Plan Discussed with: CRNA and Anesthesiologist  Anesthesia Plan Comments:         Anesthesia Quick Evaluation

## 2017-07-01 ENCOUNTER — Inpatient Hospital Stay (HOSPITAL_COMMUNITY): Payer: 59

## 2017-07-01 ENCOUNTER — Inpatient Hospital Stay (HOSPITAL_COMMUNITY)
Admission: RE | Admit: 2017-07-01 | Discharge: 2017-07-02 | DRG: 460 | Disposition: A | Discharge status: Home / Self Care | Payer: 59 | Attending: Neurological Surgery | Admitting: Neurological Surgery

## 2017-07-01 ENCOUNTER — Other Ambulatory Visit: Payer: Self-pay

## 2017-07-01 ENCOUNTER — Inpatient Hospital Stay (HOSPITAL_COMMUNITY): Payer: 59 | Admitting: Anesthesiology

## 2017-07-01 ENCOUNTER — Encounter (HOSPITAL_COMMUNITY)
Admission: RE | Disposition: A | Discharge status: Home / Self Care | Payer: Self-pay | Source: Home / Self Care | Attending: Neurological Surgery

## 2017-07-01 ENCOUNTER — Encounter (HOSPITAL_COMMUNITY): Payer: Self-pay | Admitting: *Deleted

## 2017-07-01 DIAGNOSIS — M47812 Spondylosis without myelopathy or radiculopathy, cervical region: Secondary | ICD-10-CM | POA: Diagnosis not present

## 2017-07-01 DIAGNOSIS — Z419 Encounter for procedure for purposes other than remedying health state, unspecified: Secondary | ICD-10-CM

## 2017-07-01 DIAGNOSIS — E785 Hyperlipidemia, unspecified: Secondary | ICD-10-CM | POA: Diagnosis not present

## 2017-07-01 DIAGNOSIS — G8929 Other chronic pain: Secondary | ICD-10-CM | POA: Diagnosis not present

## 2017-07-01 DIAGNOSIS — L4050 Arthropathic psoriasis, unspecified: Secondary | ICD-10-CM | POA: Diagnosis present

## 2017-07-01 DIAGNOSIS — M549 Dorsalgia, unspecified: Secondary | ICD-10-CM | POA: Diagnosis not present

## 2017-07-01 DIAGNOSIS — Z882 Allergy status to sulfonamides status: Secondary | ICD-10-CM

## 2017-07-01 DIAGNOSIS — G2581 Restless legs syndrome: Secondary | ICD-10-CM | POA: Diagnosis present

## 2017-07-01 DIAGNOSIS — Z79899 Other long term (current) drug therapy: Secondary | ICD-10-CM | POA: Diagnosis not present

## 2017-07-01 DIAGNOSIS — M48062 Spinal stenosis, lumbar region with neurogenic claudication: Secondary | ICD-10-CM | POA: Diagnosis not present

## 2017-07-01 DIAGNOSIS — Z86010 Personal history of colonic polyps: Secondary | ICD-10-CM

## 2017-07-01 DIAGNOSIS — G4700 Insomnia, unspecified: Secondary | ICD-10-CM | POA: Diagnosis present

## 2017-07-01 DIAGNOSIS — Z888 Allergy status to other drugs, medicaments and biological substances status: Secondary | ICD-10-CM

## 2017-07-01 DIAGNOSIS — M4050 Lordosis, unspecified, site unspecified: Secondary | ICD-10-CM | POA: Diagnosis present

## 2017-07-01 DIAGNOSIS — Z881 Allergy status to other antibiotic agents status: Secondary | ICD-10-CM

## 2017-07-01 DIAGNOSIS — M5137 Other intervertebral disc degeneration, lumbosacral region: Secondary | ICD-10-CM | POA: Diagnosis not present

## 2017-07-01 DIAGNOSIS — M4326 Fusion of spine, lumbar region: Secondary | ICD-10-CM | POA: Diagnosis not present

## 2017-07-01 DIAGNOSIS — M47817 Spondylosis without myelopathy or radiculopathy, lumbosacral region: Secondary | ICD-10-CM | POA: Diagnosis not present

## 2017-07-01 DIAGNOSIS — H409 Unspecified glaucoma: Secondary | ICD-10-CM | POA: Diagnosis present

## 2017-07-01 DIAGNOSIS — K219 Gastro-esophageal reflux disease without esophagitis: Secondary | ICD-10-CM | POA: Diagnosis present

## 2017-07-01 DIAGNOSIS — M7918 Myalgia, other site: Secondary | ICD-10-CM | POA: Diagnosis present

## 2017-07-01 HISTORY — PX: LUMBAR SPINE SURGERY: SHX701

## 2017-07-01 HISTORY — PX: ABDOMINAL EXPOSURE: SHX5708

## 2017-07-01 HISTORY — PX: ANTERIOR LUMBAR FUSION: SHX1170

## 2017-07-01 SURGERY — ANTERIOR LUMBAR FUSION 1 LEVEL
Anesthesia: General | Site: Spine Lumbar

## 2017-07-01 MED ORDER — PHENYLEPHRINE HCL 10 MG/ML IJ SOLN
INTRAMUSCULAR | Status: DC | PRN
  Administered 2017-07-01: 80 ug via INTRAVENOUS

## 2017-07-01 MED ORDER — SODIUM CHLORIDE 0.9 % IV SOLN: 250.0000 mL | INTRAVENOUS | Status: DC

## 2017-07-01 MED ORDER — FENTANYL CITRATE (PF) 250 MCG/5ML IJ SOLN
INTRAMUSCULAR | Status: AC
  Filled 2017-07-01: qty 5

## 2017-07-01 MED ORDER — DEXAMETHASONE SODIUM PHOSPHATE 10 MG/ML IJ SOLN
INTRAMUSCULAR | Status: DC | PRN
  Administered 2017-07-01: 10 mg via INTRAVENOUS

## 2017-07-01 MED ORDER — HYDROCODONE-ACETAMINOPHEN 5-325 MG PO TABS
2.0000 | ORAL_TABLET | ORAL | Status: DC | PRN
  Administered 2017-07-01 – 2017-07-02 (×3): 2 via ORAL
  Filled 2017-07-01 (×3): qty 2

## 2017-07-01 MED ORDER — ONDANSETRON HCL 4 MG/2ML IJ SOLN
INTRAMUSCULAR | Status: DC | PRN
  Administered 2017-07-01: 4 mg via INTRAVENOUS

## 2017-07-01 MED ORDER — METHOCARBAMOL 1000 MG/10ML IJ SOLN
500.0000 mg | Freq: Four times a day (QID) | INTRAVENOUS | Status: DC | PRN
  Filled 2017-07-01: qty 5

## 2017-07-01 MED ORDER — HYDROMORPHONE HCL 1 MG/ML IJ SOLN
INTRAMUSCULAR | Status: AC
  Filled 2017-07-01: qty 1

## 2017-07-01 MED ORDER — CHLORHEXIDINE GLUCONATE CLOTH 2 % EX PADS: 6.0000 | MEDICATED_PAD | Freq: Once | CUTANEOUS | Status: DC

## 2017-07-01 MED ORDER — MIDAZOLAM HCL 2 MG/2ML IJ SOLN
INTRAMUSCULAR | Status: AC
  Filled 2017-07-01: qty 2

## 2017-07-01 MED ORDER — ACETAMINOPHEN 325 MG PO TABS: 650.0000 mg | ORAL_TABLET | ORAL | Status: DC | PRN

## 2017-07-01 MED ORDER — LIDOCAINE HCL (CARDIAC) 20 MG/ML IV SOLN
INTRAVENOUS | Status: DC | PRN
  Administered 2017-07-01: 100 mg via INTRAVENOUS

## 2017-07-01 MED ORDER — ALUM & MAG HYDROXIDE-SIMETH 200-200-20 MG/5ML PO SUSP
30.0000 mL | Freq: Four times a day (QID) | ORAL | Status: DC | PRN
Start: 2017-07-01 — End: 2017-07-02

## 2017-07-01 MED ORDER — SODIUM CHLORIDE 0.9% FLUSH
3.0000 mL | Freq: Two times a day (BID) | INTRAVENOUS | Status: DC
  Administered 2017-07-01: 3 mL via INTRAVENOUS

## 2017-07-01 MED ORDER — THROMBIN (RECOMBINANT) 5000 UNITS EX SOLR
CUTANEOUS | Status: AC
  Filled 2017-07-01: qty 5000

## 2017-07-01 MED ORDER — CEFAZOLIN SODIUM-DEXTROSE 2-4 GM/100ML-% IV SOLN
INTRAVENOUS | Status: AC
  Filled 2017-07-01: qty 100

## 2017-07-01 MED ORDER — THROMBIN (RECOMBINANT) 5000 UNITS EX SOLR
OROMUCOSAL | Status: DC | PRN
  Administered 2017-07-01: 5 mL via TOPICAL

## 2017-07-01 MED ORDER — SENNA 8.6 MG PO TABS
1.0000 | ORAL_TABLET | Freq: Two times a day (BID) | ORAL | Status: DC
  Administered 2017-07-01 – 2017-07-02 (×2): 8.6 mg via ORAL
  Filled 2017-07-01 (×2): qty 1

## 2017-07-01 MED ORDER — PHENOL 1.4 % MT LIQD: 1.0000 | OROMUCOSAL | Status: DC | PRN

## 2017-07-01 MED ORDER — SUGAMMADEX SODIUM 200 MG/2ML IV SOLN
INTRAVENOUS | Status: DC | PRN
  Administered 2017-07-01: 150 mg via INTRAVENOUS

## 2017-07-01 MED ORDER — MEPERIDINE HCL 25 MG/ML IJ SOLN: 6.2500 mg | INTRAMUSCULAR | Status: DC | PRN

## 2017-07-01 MED ORDER — FLEET ENEMA 7-19 GM/118ML RE ENEM: 1.0000 | ENEMA | Freq: Once | RECTAL | Status: DC | PRN

## 2017-07-01 MED ORDER — POLYETHYLENE GLYCOL 3350 17 G PO PACK: 17.0000 g | PACK | Freq: Every day | ORAL | Status: DC | PRN

## 2017-07-01 MED ORDER — SODIUM CHLORIDE 0.9% FLUSH: 3.0000 mL | INTRAVENOUS | Status: DC | PRN

## 2017-07-01 MED ORDER — ACETAMINOPHEN 10 MG/ML IV SOLN: 1000.0000 mg | Freq: Once | INTRAVENOUS | Status: DC | PRN

## 2017-07-01 MED ORDER — PROPOFOL 10 MG/ML IV BOLUS
INTRAVENOUS | Status: DC | PRN
  Administered 2017-07-01: 150 mg via INTRAVENOUS

## 2017-07-01 MED ORDER — HYDROMORPHONE HCL 1 MG/ML IJ SOLN
0.2500 mg | INTRAMUSCULAR | Status: DC | PRN
  Administered 2017-07-01 (×2): 0.5 mg via INTRAVENOUS

## 2017-07-01 MED ORDER — HYDROCODONE-ACETAMINOPHEN 7.5-325 MG PO TABS
ORAL_TABLET | ORAL | Status: AC
  Filled 2017-07-01: qty 1

## 2017-07-01 MED ORDER — MORPHINE SULFATE (PF) 4 MG/ML IV SOLN
2.0000 mg | INTRAVENOUS | Status: DC | PRN
  Filled 2017-07-01: qty 1

## 2017-07-01 MED ORDER — KETOROLAC TROMETHAMINE 15 MG/ML IJ SOLN
15.0000 mg | Freq: Four times a day (QID) | INTRAMUSCULAR | Status: DC
  Administered 2017-07-01 – 2017-07-02 (×3): 15 mg via INTRAVENOUS
  Filled 2017-07-01 (×4): qty 1

## 2017-07-01 MED ORDER — HYDROCODONE-ACETAMINOPHEN 5-325 MG PO TABS: 1.0000 | ORAL_TABLET | ORAL | Status: DC | PRN

## 2017-07-01 MED ORDER — METHOCARBAMOL 500 MG PO TABS: 500.0000 mg | ORAL_TABLET | Freq: Four times a day (QID) | ORAL | Status: DC | PRN

## 2017-07-01 MED ORDER — LIDOCAINE-EPINEPHRINE 1 %-1:100000 IJ SOLN
INTRAMUSCULAR | Status: AC
  Filled 2017-07-01: qty 1

## 2017-07-01 MED ORDER — ROCURONIUM BROMIDE 100 MG/10ML IV SOLN
INTRAVENOUS | Status: DC | PRN
  Administered 2017-07-01: 10 mg via INTRAVENOUS
  Administered 2017-07-01: 30 mg via INTRAVENOUS
  Administered 2017-07-01: 50 mg via INTRAVENOUS

## 2017-07-01 MED ORDER — LATANOPROST 0.005 % OP SOLN
1.0000 [drp] | Freq: Every day | OPHTHALMIC | Status: DC
  Administered 2017-07-01: 1 [drp] via OPHTHALMIC
  Filled 2017-07-01: qty 2.5

## 2017-07-01 MED ORDER — LACTATED RINGERS IV SOLN
INTRAVENOUS | Status: DC
  Administered 2017-07-01: 16:00:00 via INTRAVENOUS

## 2017-07-01 MED ORDER — THROMBIN (RECOMBINANT) 20000 UNITS EX SOLR
CUTANEOUS | Status: AC
  Filled 2017-07-01: qty 20000

## 2017-07-01 MED ORDER — 0.9 % SODIUM CHLORIDE (POUR BTL) OPTIME
TOPICAL | Status: DC | PRN
  Administered 2017-07-01: 1000 mL

## 2017-07-01 MED ORDER — DOCUSATE SODIUM 100 MG PO CAPS
100.0000 mg | ORAL_CAPSULE | Freq: Two times a day (BID) | ORAL | Status: DC
  Administered 2017-07-01 – 2017-07-02 (×2): 100 mg via ORAL
  Filled 2017-07-01 (×2): qty 1

## 2017-07-01 MED ORDER — SODIUM CHLORIDE 0.9 % IR SOLN
Status: DC | PRN
  Administered 2017-07-01: 500 mL

## 2017-07-01 MED ORDER — CEFAZOLIN SODIUM-DEXTROSE 2-4 GM/100ML-% IV SOLN
2.0000 g | Freq: Three times a day (TID) | INTRAVENOUS | Status: AC
  Administered 2017-07-01 (×2): 2 g via INTRAVENOUS
  Filled 2017-07-01 (×2): qty 100

## 2017-07-01 MED ORDER — MIDAZOLAM HCL 5 MG/5ML IJ SOLN
INTRAMUSCULAR | Status: DC | PRN
  Administered 2017-07-01: 2 mg via INTRAVENOUS

## 2017-07-01 MED ORDER — ACETAMINOPHEN 650 MG RE SUPP: 650.0000 mg | RECTAL | Status: DC | PRN

## 2017-07-01 MED ORDER — PROPOFOL 10 MG/ML IV BOLUS
INTRAVENOUS | Status: AC
  Filled 2017-07-01: qty 20

## 2017-07-01 MED ORDER — LACTATED RINGERS IV SOLN
INTRAVENOUS | Status: DC | PRN
  Administered 2017-07-01 (×2): via INTRAVENOUS

## 2017-07-01 MED ORDER — MENTHOL 3 MG MT LOZG: 1.0000 | LOZENGE | OROMUCOSAL | Status: DC | PRN

## 2017-07-01 MED ORDER — BISACODYL 10 MG RE SUPP: 10.0000 mg | Freq: Every day | RECTAL | Status: DC | PRN

## 2017-07-01 MED ORDER — CEFAZOLIN SODIUM-DEXTROSE 2-4 GM/100ML-% IV SOLN
2.0000 g | INTRAVENOUS | Status: AC
  Administered 2017-07-01: 2 g via INTRAVENOUS

## 2017-07-01 MED ORDER — PHENYLEPHRINE HCL 10 MG/ML IJ SOLN
INTRAVENOUS | Status: DC | PRN
  Administered 2017-07-01: 25 ug/min via INTRAVENOUS

## 2017-07-01 MED ORDER — ONDANSETRON HCL 4 MG PO TABS
4.0000 mg | ORAL_TABLET | Freq: Four times a day (QID) | ORAL | Status: DC | PRN
Start: 2017-07-01 — End: 2017-07-02

## 2017-07-01 MED ORDER — PROMETHAZINE HCL 25 MG/ML IJ SOLN: 6.2500 mg | INTRAMUSCULAR | Status: DC | PRN

## 2017-07-01 MED ORDER — HYDROCODONE-ACETAMINOPHEN 7.5-325 MG PO TABS
1.0000 | ORAL_TABLET | Freq: Once | ORAL | Status: AC | PRN
  Administered 2017-07-01: 1 via ORAL

## 2017-07-01 MED ORDER — BUPIVACAINE HCL (PF) 0.5 % IJ SOLN
INTRAMUSCULAR | Status: AC
  Filled 2017-07-01: qty 30

## 2017-07-01 MED ORDER — ONDANSETRON HCL 4 MG/2ML IJ SOLN: 4.0000 mg | Freq: Four times a day (QID) | INTRAMUSCULAR | Status: DC | PRN

## 2017-07-01 MED ORDER — ZOLPIDEM TARTRATE 5 MG PO TABS: 10.0000 mg | ORAL_TABLET | Freq: Every evening | ORAL | Status: DC | PRN

## 2017-07-01 MED ORDER — FENTANYL CITRATE (PF) 100 MCG/2ML IJ SOLN
INTRAMUSCULAR | Status: DC | PRN
  Administered 2017-07-01 (×3): 50 ug via INTRAVENOUS
  Administered 2017-07-01: 100 ug via INTRAVENOUS

## 2017-07-01 SURGICAL SUPPLY — 92 items
ADH SKN CLS APL DERMABOND .7 (GAUZE/BANDAGES/DRESSINGS) ×2
APPLIER CLIP 11 MED OPEN (CLIP) ×4
APR CLP MED 11 20 MLT OPN (CLIP) ×2
BAG DECANTER FOR FLEXI CONT (MISCELLANEOUS) ×4 IMPLANT
BASKET BONE COLLECTION (BASKET) IMPLANT
BOLT BASE TI 5X17.5 VARIABLE (Bolt) ×6 IMPLANT
BONE CANC CHIPS 20CC PCAN1/4 (Bone Implant) ×4 IMPLANT
BUR BARREL STRAIGHT FLUTE 4.0 (BURR) ×4 IMPLANT
BUR MATCHSTICK NEURO 3.0 LAGG (BURR) IMPLANT
CANISTER SUCT 3000ML PPV (MISCELLANEOUS) ×4 IMPLANT
CHIPS CANC BONE 20CC PCAN1/4 (Bone Implant) ×2 IMPLANT
CLIP APPLIE 11 MED OPEN (CLIP) ×4 IMPLANT
CLIP LIGATING EXTRA MED SLVR (CLIP) ×2 IMPLANT
CLIP LIGATING EXTRA SM BLUE (MISCELLANEOUS) ×2 IMPLANT
COVER BACK TABLE 60X90IN (DRAPES) ×4 IMPLANT
DECANTER SPIKE VIAL GLASS SM (MISCELLANEOUS) ×4 IMPLANT
DERMABOND ADVANCED (GAUZE/BANDAGES/DRESSINGS) ×2
DERMABOND ADVANCED .7 DNX12 (GAUZE/BANDAGES/DRESSINGS) ×4 IMPLANT
DRAPE C-ARM 42X72 X-RAY (DRAPES) ×12 IMPLANT
DRAPE LAPAROTOMY 100X72X124 (DRAPES) ×4 IMPLANT
DRAPE POUCH INSTRU U-SHP 10X18 (DRAPES) ×4 IMPLANT
DRSG OPSITE POSTOP 4X6 (GAUZE/BANDAGES/DRESSINGS) ×2 IMPLANT
DURAPREP 26ML APPLICATOR (WOUND CARE) ×4 IMPLANT
ELECT BLADE 4.0 EZ CLEAN MEGAD (MISCELLANEOUS) ×4
ELECT REM PT RETURN 9FT ADLT (ELECTROSURGICAL) ×8
ELECTRODE BLDE 4.0 EZ CLN MEGD (MISCELLANEOUS) ×4 IMPLANT
ELECTRODE REM PT RTRN 9FT ADLT (ELECTROSURGICAL) ×2 IMPLANT
GAUZE SPONGE 4X4 16PLY XRAY LF (GAUZE/BANDAGES/DRESSINGS) IMPLANT
GLOVE BIOGEL PI IND STRL 6.5 (GLOVE) IMPLANT
GLOVE BIOGEL PI IND STRL 7.5 (GLOVE) IMPLANT
GLOVE BIOGEL PI IND STRL 8.5 (GLOVE) IMPLANT
GLOVE BIOGEL PI INDICATOR 6.5 (GLOVE) ×2
GLOVE BIOGEL PI INDICATOR 7.5 (GLOVE)
GLOVE BIOGEL PI INDICATOR 8.5 (GLOVE) ×2
GLOVE ECLIPSE 8.5 STRL (GLOVE) ×4 IMPLANT
GLOVE EXAM NITRILE LRG STRL (GLOVE) IMPLANT
GLOVE EXAM NITRILE XL STR (GLOVE) IMPLANT
GLOVE EXAM NITRILE XS STR PU (GLOVE) IMPLANT
GLOVE SS BIOGEL STRL SZ 7.5 (GLOVE) ×2 IMPLANT
GLOVE SUPERSENSE BIOGEL SZ 7.5 (GLOVE) ×4
GLOVE SURG SS PI 7.5 STRL IVOR (GLOVE) ×8 IMPLANT
GOWN STRL REUS W/ TWL LRG LVL3 (GOWN DISPOSABLE) ×2 IMPLANT
GOWN STRL REUS W/ TWL XL LVL3 (GOWN DISPOSABLE) ×2 IMPLANT
GOWN STRL REUS W/TWL 2XL LVL3 (GOWN DISPOSABLE) ×4 IMPLANT
GOWN STRL REUS W/TWL LRG LVL3 (GOWN DISPOSABLE) ×8
GOWN STRL REUS W/TWL XL LVL3 (GOWN DISPOSABLE) ×4
GRAFT BNE CANC CHIPS 1-8 20CC (Bone Implant) IMPLANT
IMPL BASE TI 6X38X28MM 15DE (Neuro Prosthesis/Implant) IMPLANT
IMPLANT BASE TI 6X38X28MM 15DE (Neuro Prosthesis/Implant) ×4 IMPLANT
INSERT FOGARTY 61MM (MISCELLANEOUS) IMPLANT
INSERT FOGARTY SM (MISCELLANEOUS) IMPLANT
KIT BASIN OR (CUSTOM PROCEDURE TRAY) ×4 IMPLANT
KIT INFUSE X SMALL 1.4CC (Orthopedic Implant) ×2 IMPLANT
KIT ROOM TURNOVER OR (KITS) ×4 IMPLANT
LOOP VESSEL MAXI BLUE (MISCELLANEOUS) ×4 IMPLANT
LOOP VESSEL MINI RED (MISCELLANEOUS) IMPLANT
NDL HYPO 25X1 1.5 SAFETY (NEEDLE) ×2 IMPLANT
NDL SPNL 18GX3.5 QUINCKE PK (NEEDLE) IMPLANT
NEEDLE HYPO 25X1 1.5 SAFETY (NEEDLE) ×4 IMPLANT
NEEDLE SPNL 18GX3.5 QUINCKE PK (NEEDLE) ×4 IMPLANT
NS IRRIG 1000ML POUR BTL (IV SOLUTION) ×4 IMPLANT
PACK LAMINECTOMY NEURO (CUSTOM PROCEDURE TRAY) ×4 IMPLANT
PAD ARMBOARD 7.5X6 YLW CONV (MISCELLANEOUS) ×10 IMPLANT
SPONGE INTESTINAL PEANUT (DISPOSABLE) ×10 IMPLANT
SPONGE LAP 18X18 X RAY DECT (DISPOSABLE) ×4 IMPLANT
SPONGE LAP 4X18 X RAY DECT (DISPOSABLE) IMPLANT
SPONGE SURGIFOAM ABS GEL 100 (HEMOSTASIS) ×2 IMPLANT
STAPLER VISISTAT 35W (STAPLE) IMPLANT
SUT PDS AB 1 CTX 36 (SUTURE) ×2 IMPLANT
SUT PROLENE 4 0 RB 1 (SUTURE)
SUT PROLENE 4-0 RB1 .5 CRCL 36 (SUTURE) IMPLANT
SUT PROLENE 5 0 C1 (SUTURE) ×6 IMPLANT
SUT PROLENE 5 0 CC1 (SUTURE) IMPLANT
SUT PROLENE 6 0 C 1 30 (SUTURE) ×2 IMPLANT
SUT PROLENE 6 0 CC (SUTURE) IMPLANT
SUT SILK 0 TIES 10X30 (SUTURE) ×2 IMPLANT
SUT SILK 2 0 TIES 10X30 (SUTURE) ×8 IMPLANT
SUT SILK 2 0SH CR/8 30 (SUTURE) IMPLANT
SUT SILK 3 0 TIES 10X30 (SUTURE) ×2 IMPLANT
SUT SILK 3 0SH CR/8 30 (SUTURE) IMPLANT
SUT VIC AB 0 CT1 27 (SUTURE) ×4
SUT VIC AB 0 CT1 27XBRD ANBCTR (SUTURE) ×4 IMPLANT
SUT VIC AB 1 CT1 18XBRD ANBCTR (SUTURE) ×2 IMPLANT
SUT VIC AB 1 CT1 8-18 (SUTURE) ×4
SUT VIC AB 2-0 CP2 18 (SUTURE) ×4 IMPLANT
SUT VIC AB 3-0 FS2 27 (SUTURE) ×2 IMPLANT
SUT VIC AB 3-0 SH 8-18 (SUTURE) ×6 IMPLANT
SUT VICRYL 4-0 PS2 18IN ABS (SUTURE) IMPLANT
TOWEL GREEN STERILE (TOWEL DISPOSABLE) ×8 IMPLANT
TOWEL GREEN STERILE FF (TOWEL DISPOSABLE) ×10 IMPLANT
TRAY FOLEY W/METER SILVER 16FR (SET/KITS/TRAYS/PACK) ×4 IMPLANT
WATER STERILE IRR 1000ML POUR (IV SOLUTION) ×4 IMPLANT

## 2017-07-01 NOTE — Anesthesia Procedure Notes (Signed)
Procedure Name: Intubation Date/Time: 07/01/2017 7:41 AM Performed by: Gaylene Brooks, CRNA Pre-anesthesia Checklist: Patient identified, Emergency Drugs available, Suction available and Patient being monitored Patient Re-evaluated:Patient Re-evaluated prior to induction Oxygen Delivery Method: Circle System Utilized Preoxygenation: Pre-oxygenation with 100% oxygen Induction Type: IV induction Ventilation: Mask ventilation without difficulty and Oral airway inserted - appropriate to patient size Laryngoscope Size: Sabra Heck and 2 Grade View: Grade II Tube type: Oral Tube size: 7.5 mm Number of attempts: 1 Airway Equipment and Method: Stylet and Oral airway Placement Confirmation: ETT inserted through vocal cords under direct vision,  positive ETCO2 and breath sounds checked- equal and bilateral Secured at: 22 cm Tube secured with: Tape Dental Injury: Teeth and Oropharynx as per pre-operative assessment

## 2017-07-01 NOTE — Anesthesia Postprocedure Evaluation (Signed)
Anesthesia Post Note  Patient: Randy Moran  Procedure(s) Performed: Lumbar Five-Sacral one Anterior lumbar interbody fusion (N/A Spine Lumbar) ABDOMINAL EXPOSURE (N/A )     Patient location during evaluation: PACU Anesthesia Type: General Level of consciousness: awake and alert Pain management: pain level controlled Vital Signs Assessment: post-procedure vital signs reviewed and stable Respiratory status: spontaneous breathing, nonlabored ventilation and respiratory function stable Cardiovascular status: blood pressure returned to baseline and stable Postop Assessment: no apparent nausea or vomiting Anesthetic complications: no    Last Vitals:  Vitals:   07/01/17 1114 07/01/17 1115  BP: 106/69   Pulse: 79 81  Resp: 10 11  Temp:  36.6 C  SpO2: 100% 100%    Last Pain:  Vitals:   07/01/17 1115  TempSrc:   PainSc: 6                  Barnet Glasgow

## 2017-07-01 NOTE — Op Note (Signed)
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 Surgeon: Kristeen Miss First assistant: Newman Pies M.D. Approach and closure: Dr. Sherren Mocha Early Anesthesia: Gen. Endotracheal Indications: Mr. Cutberto Winfree is a 57 year old individual who's had a number of years of intractable back pain that has been treated conservatively in various forms. He had responded transiently to intradiscal steroid injections at the level of L5-S1. He has no evidence of any neural entrapment is no evidence of any significant spinal stenosis but does have a markedly degenerated disc at the level of L5-S1. Having failed all efforts at conservative management use advised regarding surgical stabilization of the L5-S1 joint with an anterior discectomy and arthrodesis.  Procedure: Patient was brought to the operating room placed on the table in supine position. After localizing L5-S1 space on the patient's skin the procedure was started by Dr. Sherren Mocha early who opened of the retroperitoneal region and isolated the L5-S1 interspace. With appropriate retractors in place I open the anterior portion of the disc space with #15 blade removed a substantial quantity of severely degenerated and desiccated disc material. Superior and inferior endplates were curettaged to remove all remnants of cartilaginous attachment. In a high-speed drill with a barrel attachment was used to decorticate the endplates adequately and smooth the edges from some small spurs in the posterior aspect of the endplates. With this the interbody sizer was used to choose an appropriate size and angle of correction for his lordosis and lumbar spine. Is felt that a 13 mm tall 15 lordotic spacer of medium-size would fit best into this interval. This  space was packed with a piece of extra small infuse along with 20 mL of allograft cancellus bone. The spacer was secured with 3 locking screws 2 of them into the vertebral body of the sacrum and one into the vertebral body of L5. These measured 17 mm in length. Final radiographs were obtained in AP and lateral projection. The procedure was then returned to Dr. Donnetta Hutching for final closure as there was noted to be some bleeding from an iliac vein that was easily controlled.

## 2017-07-01 NOTE — H&P (Signed)
Randy Moran is an 57 y.o. male.   Chief Complaint: Back pain HPI: Randy Moran is a 57 yo male with chronic unremitting back pain. He has failed conservative efforts but has had transient relief with intradiscal steroid injections at L5S1. After careful consideration he is now admitted to undergo anterior lumbar interbody fusion at L5S1.  Past Medical History:  Diagnosis Date  . Anxiety   . ANXIETY 03/22/2007   Qualifier: Diagnosis of  By: Jenny Reichmann MD, Hunt Oris   . Asthma    as a child  . DJD (degenerative joint disease)   . GERD (gastroesophageal reflux disease)    in the past  . Glaucoma left eye  . Hx of colonic polyps   . Hyperlipidemia   . Insomnia   . Lower back pain   . Migraines    treated by [facility]  . Myofascial pain syndrome   . Prostatitis   . Psoriatic arthritis ([place]) 03/04/2015  . Restless leg syndrome 08/25/2012    Past Surgical History:  Procedure Laterality Date  . APPENDECTOMY  1967  . bilateral inguinal herniorrhaphy     1960's  . bilateral shoulder surgery  2006, 2007,2010  . ganglion cyst surgery  2009  . HERNIA REPAIR  09/30/11   bilateral inguinal hernia  . knee surgery x 2  2004,2008    Family History  Problem Relation Age of Onset  . Colon cancer Mother   . COPD Father   . Coronary artery disease Unknown   . Hyperlipidemia Unknown   . Hypertension Unknown   . Cancer Maternal Aunt        breast  . Cancer Maternal Uncle        prostate  . Colon cancer Maternal Uncle   . Esophageal cancer Neg Hx   . Rectal cancer Neg Hx   . Stomach cancer Neg Hx    Social History:  reports that  has never smoked. he has never used smokeless tobacco. He reports that he does not drink alcohol or use drugs.  Allergies:  Allergies  Allergen Reactions  . Statins Other (See Comments)    Fatigue and weakness  . Sulfamethoxazole-Trimethoprim     REACTION: rash  . Levofloxacin Rash    Medications Prior to Admission  Medication Sig Dispense Refill  .  acetaminophen (TYLENOL) 500 MG tablet Take 500 mg by mouth every 6 (six) hours as needed for mild pain, moderate pain or headache.     . ibuprofen (ADVIL,MOTRIN) 200 MG tablet Take 200 mg by mouth every 8 (eight) hours as needed for mild pain or moderate pain.    Marland Kitchen latanoprost (XALATAN) 0.005 % ophthalmic solution Place 1 drop into both eyes at bedtime.     . Multiple Vitamins-Minerals (MULTIVITAMIN WITH MINERALS) tablet Take 1 tablet by mouth daily.    Marland Kitchen zolpidem (AMBIEN) 10 MG tablet Take 10 mg by mouth at bedtime as needed for sleep.     Marland Kitchen HUMIRA PEN 40 MG/0.8ML PNKT Inject 40 mg into the muscle every 14 (fourteen) days.       No results found for this or any previous visit (from the past 48 hour(s)). No results found.  Review of Systems  Constitutional: Negative.   HENT: Negative.   Eyes: Negative.   Respiratory: Negative.   Cardiovascular: Negative.   Gastrointestinal: Negative.   Genitourinary: Negative.   Musculoskeletal: Positive for back pain.  Skin: Negative.   Neurological: Positive for tingling.  Endo/Heme/Allergies: Negative.   Psychiatric/Behavioral: Negative.  Blood pressure 123/77, pulse 79, temperature 98.1 F (36.7 C), temperature source Oral, resp. rate 18, weight 69.9 kg (154 lb), SpO2 100 %. Physical Exam  Constitutional: He is oriented to person, place, and time. He appears well-developed and well-nourished.  HENT:  Head: Normocephalic and atraumatic.  Eyes: Conjunctivae and EOM are normal. Pupils are equal, round, and reactive to light.  Neck: Normal range of motion. Neck supple.  GI: Soft. Bowel sounds are normal.  Musculoskeletal:  Positive SLR bilaterally at 30 degrees. Negative Patricks maneuver. nontender back to palpation and percussion  Neurological: He is alert and oriented to person, place, and time. He has normal reflexes. No cranial nerve deficit. Coordination normal.  Skin: Skin is warm and dry.  Psychiatric: He has a normal mood and  affect. His behavior is normal. Judgment and thought content normal.     Assessment/Plan Spondylosis L5S1 with chronic back pain. Anterior lumbar interbody fusion L5S1.  Earleen Newport, MD 07/01/2017, 6:26 AM

## 2017-07-01 NOTE — Transfer of Care (Signed)
Immediate Anesthesia Transfer of Care Note  Patient: Randy Moran  Procedure(s) Performed: Lumbar Five-Sacral one Anterior lumbar interbody fusion (N/A Spine Lumbar) ABDOMINAL EXPOSURE (N/A )  Patient Location: PACU  Anesthesia Type:General  Level of Consciousness: awake, alert  and oriented  Airway & Oxygen Therapy: Patient Spontanous Breathing and Patient connected to nasal cannula oxygen  Post-op Assessment: Report given to RN, Post -op Vital signs reviewed and stable and Patient moving all extremities X 4  Post vital signs: Reviewed and stable  Last Vitals:  Vitals:   07/01/17 0540  BP: 123/77  Pulse: 79  Resp: 18  Temp: 36.7 C  SpO2: 100%    Last Pain:  Vitals:   07/01/17 0604  TempSrc:   PainSc: 4       Patients Stated Pain Goal: 3 (84/16/60 6301)  Complications: No apparent anesthesia complications

## 2017-07-01 NOTE — Op Note (Signed)
    OPERATIVE REPORT  DATE OF SURGERY: 07/01/2017  PATIENT: Randy Moran, 57 y.o. male MRN: [id number]  DOB: Oct 15, 1960  PRE-OPERATIVE DIAGNOSIS: Degenerative disc disease  POST-OPERATIVE DIAGNOSIS:  Same  PROCEDURE: Anterior exposure for L5-S1 disc fusion  SURGEON:  Curt Jews, M.D.  Co-surgeon for the exposure Dr. Kristeen Miss  ANESTHESIA: General  EBL: 100 ml  Total I/O In: 1000 [I.V.:1000] Out: 275 [Urine:175; Blood:100]  BLOOD ADMINISTERED: None  DRAINS: None  SPECIMEN: None  COUNTS CORRECT:  YES  PLAN OF CARE: PACU  PATIENT DISPOSITION:  PACU - hemodynamically stable  PROCEDURE DETAILS: Patient was taken to the operating placed in supine position where the area of the abdomen was prepped and draped in the usual sterile fashion.  Lateral C-arm projection was used to identify the level of the L5-S1 disc.  An incision was made from the midline quadrant.  This was carried down through the anterior rectus sheath in line with the skin incision.  The rectus muscle was mobilized circumferentially.  The patient had a prior left inguinal hernia repair and the mesh was visible under the level of the rectus muscle.  The retroperitoneal space was entered laterally at the level of the semilunar line and blunt dissection was used to mobilize the to the right.  The posterior rectus sheath was opened in line laterally.  Blunt dissection was used to continue mobilization of the intraperitoneal contents and left ureter to the right.  The L5-S1 disc was exposed.  The left iliac vein was mobilized to the left.  There was a a variant crossing branch that did have some bleeding and was divided blunt dissection was continued the right and left of the L5-S1 disc.  Middle sacral vessels were clipped and divided.  The Thompson retractor was brought onto the field and the reverse lip 150 blade was positioned to the right of the L5-S1 disc.  The 140 malleable retractor was used to mobilize the  left iliac vein to the left and a 140 malleable retractor was exposure as well.  The discectomy and fusion will be dictated by Dr. Ellene Route.  At the completion of this the retractors were removed.  The left iliac vein repair area was re-visualized and a Ligaclip was placed at one area with some venous ooze at the edge.  This controlled the bleeding.  The wounds were irrigated and the anterior rectus sheath was closed with a #1 PDS suture in a running fashion.  The wounds were again irrigated and the skin was closed with a 4-0 subcuticular Vicryl stitch.  Sterile dressing was applied and the patient was transferred to the recovery room in stable condition   Rosetta Posner, M.D., [facility] 07/01/2017 10:08 AM

## 2017-07-02 MED ORDER — HYDROCODONE-ACETAMINOPHEN 5-325 MG PO TABS: 1.0000 | ORAL_TABLET | ORAL | 0 refills | Status: DC | PRN

## 2017-07-02 MED ORDER — METHOCARBAMOL 500 MG PO TABS: 500.0000 mg | ORAL_TABLET | Freq: Four times a day (QID) | ORAL | 3 refills | Status: DC | PRN

## 2017-07-02 NOTE — Discharge Summary (Signed)
Physician Discharge Summary  Patient ID: Randy Moran MRN: [id number] DOB/AGE: 1961-03-03 57 y.o.  Admit date: 07/01/2017 Discharge date: 07/02/2017  Admission Diagnoses: Spondylosis L5-S1 with chronic back pain  Discharge Diagnoses: Spondylosis L5-S1 with chronic back pain Active Problems:   Spinal stenosis, lumbar region, with neurogenic claudication   Discharged Condition: good  Hospital Course: Patient was admitted to undergo anterior lumbar decompression and arthrodesis at L5-S1. He tolerated surgery well.  Consults: vascular surgery  Significant Diagnostic Studies: None  Treatments: surgery: Anterior lumbar interbody arthrodesis with titanium spacer allograft and infuse.  Discharge Exam: Blood pressure 93/64, pulse 61, temperature 97.9 F (36.6 C), temperature source Oral, resp. rate 11, height 5\' 10"  (1.778 m), weight 69.9 kg (154 lb), SpO2 99 %. Incision is clean and dry. Motor function is intact. Station and gait are intact.  Disposition: 01-Home or Self Care  Discharge Instructions    Call MD for:  redness, tenderness, or signs of infection (pain, swelling, redness, odor or green/yellow discharge around incision site)   Complete by:  As directed    Call MD for:  severe uncontrolled pain   Complete by:  As directed    Call MD for:  temperature >100.4   Complete by:  As directed    Diet - low sodium heart healthy   Complete by:  As directed    Discharge instructions   Complete by:  As directed    Okay to shower. Do not apply salves or appointments to incision. No heavy lifting with the upper extremities greater than 15 pounds. May resume driving when not requiring pain medication and patient feels comfortable with doing so.   Incentive spirometry RT   Complete by:  As directed    Increase activity slowly   Complete by:  As directed      Allergies as of 07/02/2017      Reactions   Statins Other (See Comments)   Fatigue and weakness   Sulfamethoxazole-trimethoprim    REACTION: rash   Levofloxacin Rash      Medication List    TAKE these medications   acetaminophen 500 MG tablet Commonly known as:  TYLENOL Take 500 mg by mouth every 6 (six) hours as needed for mild pain, moderate pain or headache.   HUMIRA PEN 40 MG/0.8ML Pnkt Generic drug:  Adalimumab Inject 40 mg into the muscle every 14 (fourteen) days.   HYDROcodone-acetaminophen 5-325 MG tablet Commonly known as:  NORCO/VICODIN Take 1-2 tablets by mouth every 4 (four) hours as needed for moderate pain ((score 4 to 6)).   ibuprofen 200 MG tablet Commonly known as:  ADVIL,MOTRIN Take 200 mg by mouth every 8 (eight) hours as needed for mild pain or moderate pain.   latanoprost 0.005 % ophthalmic solution Commonly known as:  XALATAN Place 1 drop into both eyes at bedtime.   methocarbamol 500 MG tablet Commonly known as:  ROBAXIN Take 1 tablet (500 mg total) by mouth every 6 (six) hours as needed for muscle spasms.   multivitamin with minerals tablet Take 1 tablet by mouth daily.   zolpidem 10 MG tablet Commonly known as:  AMBIEN Take 10 mg by mouth at bedtime as needed for sleep.        SignedEarleen Newport 07/02/2017, 9:18 AM

## 2017-07-02 NOTE — Evaluation (Signed)
Physical Therapy Evaluation and Discharge Patient Details Name: Randy Moran MRN: [id number] DOB: 09/19/1960 Today's Date: 07/02/2017   History of Present Illness  Pt is a 57 y.o. male s/p L5-S1 anterior lumbar interbody fusion with abdominal exposure. PMH significant for anxiety, asthma, GERD, dejenerative joint disease, glaucoma, hyperlipidemia, insomnia, lower back pain, migraines, myofascial pain syndrome, psoriatic arthritis, and restless leg syndrome.   Clinical Impression  Patient evaluated by Physical Therapy with no further acute PT needs identified. All education has been completed and the patient has no further questions. At the time of PT eval pt was able to perform transfers and ambulation with gross modified independence and no AD. Wife present and they both were educated on precautions, brace application/wearing schedule, stair negotiation, and generalized walking program with activity progression. See below for any follow-up Physical Therapy or equipment needs. PT is signing off. Thank you for this referral.     Follow Up Recommendations No PT follow up;Supervision for mobility/OOB    Equipment Recommendations  None recommended by PT    Recommendations for Other Services       Precautions / Restrictions Precautions Precautions: Back Precaution Booklet Issued: Yes (comment) Precaution Comments: Reviewed back precautions related to ADL.  Required Braces or Orthoses: Spinal Brace Spinal Brace: Lumbar corset;Applied in sitting position Restrictions Weight Bearing Restrictions: No      Mobility  Bed Mobility Overal bed mobility: Needs Assistance Bed Mobility: Rolling;Sidelying to Sit Rolling: Modified independent (Device/Increase time) Sidelying to sit: Modified independent (Device/Increase time)       General bed mobility comments: Pt demonstrated proper log roll technique with HOB flat and rails lowered to simulate home environment.   Transfers Overall  transfer level: Modified independent Equipment used: None Transfers: Sit to/from Stand           General transfer comment: Pt demonstrated proper hand placement on seated surface for safety. Min cues for posture.  Ambulation/Gait Ambulation/Gait assistance: Modified independent (Device/Increase time) Ambulation Distance (Feet): 300 Feet Assistive device: None Gait Pattern/deviations: Decreased weight shift to right;Antalgic Gait velocity: Decreased Gait velocity interpretation: Below normal speed for age/gender General Gait Details: Slightly antalgic due to numbness/tingling in R foot. Overall pt is safe and stable with ambulation.  Stairs Stairs: Yes Stairs assistance: Supervision Stair Management: One rail Left;Step to pattern;Forwards Number of Stairs: 3 General stair comments: VC's for sequencing and general safety.   Wheelchair Mobility    Modified Rankin (Stroke Patients Only)       Balance Overall balance assessment: No apparent balance deficits (not formally assessed)                                           Pertinent Vitals/Pain Pain Assessment: Faces Faces Pain Scale: Hurts little more Pain Location: abdominal incision; lower back Pain Descriptors / Indicators: Aching;Sore Pain Intervention(s): Limited activity within patient's tolerance;Monitored during session;Repositioned    Home Living Family/patient expects to be discharged to:: Private residence Living Arrangements: Spouse/significant other Available Help at Discharge: Family;Available 24 hours/day Type of Home: House Home Access: Stairs to enter Entrance Stairs-Rails: Left Entrance Stairs-Number of Steps: 3 Home Layout: Two level;Able to live on main level with bedroom/bathroom Home Equipment: Shower seat - built in      Prior Function Level of Independence: Independent               Hand Dominance  Extremity/Trunk Assessment   Upper Extremity  Assessment Upper Extremity Assessment: Overall WFL for tasks assessed    Lower Extremity Assessment Lower Extremity Assessment: RLE deficits/detail RLE Deficits / Details: slight numbness/tingling in R foot    Cervical / Trunk Assessment Cervical / Trunk Assessment: Other exceptions Cervical / Trunk Exceptions: s/p surgery  Communication      Cognition Arousal/Alertness: Awake/alert Behavior During Therapy: WFL for tasks assessed/performed Overall Cognitive Status: Within Functional Limits for tasks assessed                                        General Comments      Exercises     Assessment/Plan    PT Assessment Patent does not need any further PT services  PT Problem List         PT Treatment Interventions      PT Goals (Current goals can be found in the Care Plan section)  Acute Rehab PT Goals Patient Stated Goal: go home today PT Goal Formulation: All assessment and education complete, DC therapy    Frequency     Barriers to discharge        Co-evaluation               AM-PAC PT "6 Clicks" Daily Activity  Outcome Measure Difficulty turning over in bed (including adjusting bedclothes, sheets and blankets)?: None Difficulty moving from lying on back to sitting on the side of the bed? : None Difficulty sitting down on and standing up from a chair with arms (e.g., wheelchair, bedside commode, etc,.)?: None Help needed moving to and from a bed to chair (including a wheelchair)?: None Help needed walking in hospital room?: None Help needed climbing 3-5 steps with a railing? : A Little 6 Click Score: 23    End of Session Equipment Utilized During Treatment: Back brace Activity Tolerance: Patient tolerated treatment well Patient left: in chair;with call bell/phone within reach;with family/visitor present Nurse Communication: Mobility status PT Visit Diagnosis: Pain;Other symptoms and signs involving the nervous system (R29.898) Pain -  part of body: (back)    Time: 8127-5170 PT Time Calculation (min) (ACUTE ONLY): 17 min   Charges:   PT Evaluation $PT Eval Moderate Complexity: 1 Mod     PT G Codes:        Rolinda Roan, PT, DPT Acute Rehabilitation Services Pager: [place] 07/02/2017, 1:34 PM

## 2017-07-02 NOTE — Progress Notes (Signed)
CSW acknowleding consult for SNF placement. Patient was not recommended for any follow up by OT and PT. Patient was discharged home with his wife.  CSW signing off.  Madilyn Fireman, MSW, LCSW-A Weekend Clinical Social Worker [phone]

## 2017-07-02 NOTE — Evaluation (Signed)
Occupational Therapy Evaluation and Discharge Patient Details Name: Randy Moran MRN: [id number] DOB: [birth year] Today's Date: 07/02/2017    History of Present Illness Pt is a 57 y.o. male s/p L5-S1 anterior lumbar interbody fusion with abdominal exposure. PMH significant for anxiety, asthma, GERD, dejenerative joint disease, glaucoma, hyperlipidemia, insomnia, lower back pain, migraines, myofascial pain syndrome, psoriatic arthritis, and restless leg syndrome.    Clinical Impression   PTA, pt was independent with ADL and functional mobility. He currently requires overall supervision for LB ADL and ADL transfers at this time to maintain back precautions and safety. Educated pt and wife concerning safe ADL participation, post-operative back precautions (handout provided) and compensatory strategies for dressing, bathing, and grooming tasks. Pt and wife additionally educated on always wearing clothing between brace, never sleeping in brace, set an alarm at night for medication, avoid sitting for long periods of time, correct bed positioning for sleeping, correct sequence for bed mobility, avoiding lifting more than 5 pounds and never wash directly over incision. All education is complete and patient/wife indicate understanding. No further acute OT needs identified and OT will sign off.       Follow Up Recommendations  No OT follow up;Supervision/Assistance - 24 hour(initial)    Equipment Recommendations  None recommended by OT    Recommendations for Other Services       Precautions / Restrictions Precautions Precautions: Back Precaution Booklet Issued: Yes (comment) Precaution Comments: Reviewed back precautions related to ADL.  Required Braces or Orthoses: Spinal Brace Spinal Brace: Lumbar corset;Applied in sitting position Restrictions Weight Bearing Restrictions: No      Mobility Bed Mobility Overal bed mobility: Needs Assistance Bed Mobility: Rolling;Sidelying to Sit;Sit  to Sidelying Rolling: Supervision;Modified independent (Device/Increase time) Sidelying to sit: Supervision;Modified independent (Device/Increase time)     Sit to sidelying: Supervision;Modified independent (Device/Increase time) General bed mobility comments: VC's for log roll technique on first attempt. Able to complete with modified independence on second.  Transfers Overall transfer level: Needs assistance Equipment used: None Transfers: Sit to/from Stand Sit to Stand: Supervision         General transfer comment: Supervision for safety.     Balance Overall balance assessment: No apparent balance deficits (not formally assessed)                                         ADL either performed or assessed with clinical judgement   ADL Overall ADL's : Needs assistance/impaired Eating/Feeding: Set up;Sitting   Grooming: Set up;Sitting   Upper Body Bathing: Set up;Sitting   Lower Body Bathing: Supervison/ safety;Sit to/from stand   Upper Body Dressing : Set up;Sitting   Lower Body Dressing: Supervision/safety;Sit to/from stand   Toilet Transfer: Supervision/safety;Ambulation;Comfort height toilet   Toileting- Clothing Manipulation and Hygiene: Supervision/safety;Sit to/from stand   Tub/ Shower Transfer: Walk-in shower;Supervision/safety;Ambulation;Shower seat   Functional mobility during ADLs: Supervision/safety General ADL Comments: Educated pt on back precautions related to ADL including compensatory strategies for LB ADL and grooming tasks. Pt and wife demonstrate understanding of all topics.      Vision Patient Visual Report: No change from baseline Vision Assessment?: No apparent visual deficits     Perception     Praxis      Pertinent Vitals/Pain Pain Assessment: Faces Faces Pain Scale: Hurts little more Pain Location: abdominal incision; lower back Pain Descriptors / Indicators: Aching;Sore Pain Intervention(s): Limited activity within  patient's tolerance;Monitored during session;Repositioned     Hand Dominance     Extremity/Trunk Assessment Upper Extremity Assessment Upper Extremity Assessment: Overall WFL for tasks assessed   Lower Extremity Assessment Lower Extremity Assessment: RLE deficits/detail RLE Deficits / Details: slight numbness/tingling in R foot       Communication     Cognition Arousal/Alertness: Awake/alert Behavior During Therapy: WFL for tasks assessed/performed Overall Cognitive Status: Within Functional Limits for tasks assessed                                     General Comments       Exercises     Shoulder Instructions      Home Living Family/patient expects to be discharged to:: Private residence Living Arrangements: Spouse/significant other Available Help at Discharge: Family;Available 24 hours/day Type of Home: House Home Access: Stairs to enter Technical brewer of Steps: 3         Bathroom Shower/Tub: Occupational psychologist: Handicapped height     Home Equipment: Civil engineer, contracting - built in          Prior Functioning/Environment Level of Independence: Independent                 OT Problem List: Decreased activity tolerance;Decreased safety awareness;Decreased knowledge of use of DME or AE;Decreased knowledge of precautions;Pain      OT Treatment/Interventions:      OT Goals(Current goals can be found in the care plan section) Acute Rehab OT Goals Patient Stated Goal: go home today OT Goal Formulation: With patient/family  OT Frequency:     Barriers to D/C:            Co-evaluation              AM-PAC PT "6 Clicks" Daily Activity     Outcome Measure Help from another person eating meals?: A Little Help from another person taking care of personal grooming?: A Little Help from another person toileting, which includes using toliet, bedpan, or urinal?: A Little Help from another person bathing (including washing,  rinsing, drying)?: A Little Help from another person to put on and taking off regular upper body clothing?: A Little Help from another person to put on and taking off regular lower body clothing?: A Little 6 Click Score: 18   End of Session Equipment Utilized During Treatment: Back brace Nurse Communication: Mobility status  Activity Tolerance: Patient tolerated treatment well Patient left: in bed;with call bell/phone within reach;with family/visitor present  OT Visit Diagnosis: Other abnormalities of gait and mobility (R26.89);Pain Pain - Right/Left: (abdominal and back) Pain - part of body: (abdominal and back)                Time: 5638-7564 OT Time Calculation (min): 20 min Charges:  OT General Charges $OT Visit: 1 Visit OT Evaluation $OT Eval Moderate Complexity: 1 Mod G-Codes:     Norman Herrlich, MS OTR/L  Pager: [place] A Mychal Durio 07/02/2017, 9:25 AM

## 2017-07-04 ENCOUNTER — Encounter (HOSPITAL_COMMUNITY): Payer: Self-pay | Admitting: Neurological Surgery

## 2017-07-06 MED FILL — Heparin Sodium (Porcine) Inj 1000 Unit/ML: INTRAMUSCULAR | Qty: 30 | Status: AC

## 2017-07-06 MED FILL — Sodium Chloride IV Soln 0.9%: INTRAVENOUS | Qty: 1000 | Status: AC

## 2017-07-12 ENCOUNTER — Other Ambulatory Visit (HOSPITAL_COMMUNITY): Payer: Self-pay | Admitting: Neurological Surgery

## 2017-07-12 DIAGNOSIS — R200 Anesthesia of skin: Secondary | ICD-10-CM | POA: Insufficient documentation

## 2017-07-12 DIAGNOSIS — R52 Pain, unspecified: Secondary | ICD-10-CM

## 2017-07-13 ENCOUNTER — Ambulatory Visit (HOSPITAL_COMMUNITY)
Admission: RE | Admit: 2017-07-13 | Discharge: 2017-07-13 | Disposition: A | Discharge status: Home / Self Care | Payer: 59 | Source: Ambulatory Visit | Attending: Neurological Surgery | Admitting: Neurological Surgery

## 2017-07-13 ENCOUNTER — Encounter (HOSPITAL_COMMUNITY): Payer: 59

## 2017-07-13 DIAGNOSIS — R200 Anesthesia of skin: Secondary | ICD-10-CM | POA: Insufficient documentation

## 2017-07-13 DIAGNOSIS — R52 Pain, unspecified: Secondary | ICD-10-CM | POA: Diagnosis not present

## 2017-07-13 NOTE — Progress Notes (Signed)
*  PRELIMINARY RESULTS* Vascular Ultrasound Right lower extremity venous duplex has been completed.  Preliminary findings: No evidence of deep vein thrombosis or baker's cyst in the right lower extremity.  Preliminary results called to Dr. Clarice Pole office @ 15:15, given to [facility].     Randy Moran 07/13/2017, 3:15 PM

## 2017-07-14 DIAGNOSIS — M5127 Other intervertebral disc displacement, lumbosacral region: Secondary | ICD-10-CM | POA: Diagnosis not present

## 2017-07-21 DIAGNOSIS — M5136 Other intervertebral disc degeneration, lumbar region: Secondary | ICD-10-CM | POA: Diagnosis not present

## 2017-07-21 DIAGNOSIS — Z79899 Other long term (current) drug therapy: Secondary | ICD-10-CM | POA: Diagnosis not present

## 2017-07-21 DIAGNOSIS — L4050 Arthropathic psoriasis, unspecified: Secondary | ICD-10-CM | POA: Diagnosis not present

## 2017-07-21 DIAGNOSIS — R5382 Chronic fatigue, unspecified: Secondary | ICD-10-CM | POA: Diagnosis not present

## 2017-08-17 ENCOUNTER — Encounter: Payer: Self-pay | Admitting: Internal Medicine

## 2017-08-23 ENCOUNTER — Other Ambulatory Visit: Payer: Self-pay | Admitting: Internal Medicine

## 2017-08-23 NOTE — Telephone Encounter (Signed)
Done erx 

## 2017-08-25 DIAGNOSIS — M5127 Other intervertebral disc displacement, lumbosacral region: Secondary | ICD-10-CM | POA: Diagnosis not present

## 2017-08-25 DIAGNOSIS — H524 Presbyopia: Secondary | ICD-10-CM | POA: Diagnosis not present

## 2017-08-25 DIAGNOSIS — H401132 Primary open-angle glaucoma, bilateral, moderate stage: Secondary | ICD-10-CM | POA: Diagnosis not present

## 2017-09-08 DIAGNOSIS — M7989 Other specified soft tissue disorders: Secondary | ICD-10-CM | POA: Insufficient documentation

## 2017-09-13 ENCOUNTER — Other Ambulatory Visit: Payer: Self-pay | Admitting: Internal Medicine

## 2017-09-13 NOTE — Telephone Encounter (Signed)
Done erx 

## 2017-09-13 NOTE — Telephone Encounter (Signed)
Last filled 08/15/2017 30#

## 2017-09-14 ENCOUNTER — Other Ambulatory Visit: Payer: Self-pay | Admitting: Internal Medicine

## 2017-10-12 ENCOUNTER — Ambulatory Visit: Payer: 59 | Admitting: Internal Medicine

## 2017-10-17 ENCOUNTER — Ambulatory Visit: Payer: 59 | Admitting: Internal Medicine

## 2017-10-24 DIAGNOSIS — G2581 Restless legs syndrome: Secondary | ICD-10-CM | POA: Diagnosis not present

## 2017-10-24 DIAGNOSIS — G43709 Chronic migraine without aura, not intractable, without status migrainosus: Secondary | ICD-10-CM | POA: Diagnosis not present

## 2017-11-09 DIAGNOSIS — M1611 Unilateral primary osteoarthritis, right hip: Secondary | ICD-10-CM | POA: Diagnosis not present

## 2017-11-16 DIAGNOSIS — G43709 Chronic migraine without aura, not intractable, without status migrainosus: Secondary | ICD-10-CM | POA: Diagnosis not present

## 2017-11-16 DIAGNOSIS — G2581 Restless legs syndrome: Secondary | ICD-10-CM | POA: Diagnosis not present

## 2017-11-16 DIAGNOSIS — R51 Headache: Secondary | ICD-10-CM | POA: Diagnosis not present

## 2017-11-16 DIAGNOSIS — R110 Nausea: Secondary | ICD-10-CM | POA: Diagnosis not present

## 2017-12-17 ENCOUNTER — Other Ambulatory Visit: Payer: Self-pay | Admitting: Internal Medicine

## 2017-12-19 NOTE — Telephone Encounter (Signed)
11/13/2017 30# 

## 2017-12-19 NOTE — Telephone Encounter (Signed)
Done erx 

## 2017-12-23 DIAGNOSIS — H04123 Dry eye syndrome of bilateral lacrimal glands: Secondary | ICD-10-CM | POA: Diagnosis not present

## 2017-12-23 DIAGNOSIS — H401132 Primary open-angle glaucoma, bilateral, moderate stage: Secondary | ICD-10-CM | POA: Diagnosis not present

## 2017-12-23 DIAGNOSIS — H2513 Age-related nuclear cataract, bilateral: Secondary | ICD-10-CM | POA: Diagnosis not present

## 2018-02-01 ENCOUNTER — Ambulatory Visit: Payer: 59 | Admitting: Family Medicine

## 2018-02-03 DIAGNOSIS — G43709 Chronic migraine without aura, not intractable, without status migrainosus: Secondary | ICD-10-CM | POA: Diagnosis not present

## 2018-02-03 DIAGNOSIS — G2581 Restless legs syndrome: Secondary | ICD-10-CM | POA: Diagnosis not present

## 2018-02-23 DIAGNOSIS — M5127 Other intervertebral disc displacement, lumbosacral region: Secondary | ICD-10-CM | POA: Diagnosis not present

## 2018-03-06 ENCOUNTER — Ambulatory Visit (INDEPENDENT_AMBULATORY_CARE_PROVIDER_SITE_OTHER): Payer: 59 | Admitting: Internal Medicine

## 2018-03-06 ENCOUNTER — Encounter: Payer: Self-pay | Admitting: Internal Medicine

## 2018-03-06 ENCOUNTER — Other Ambulatory Visit (INDEPENDENT_AMBULATORY_CARE_PROVIDER_SITE_OTHER): Payer: 59

## 2018-03-06 VITALS — BP 102/70 | HR 103 | Temp 98.7°F | Ht 70.0 in | Wt 158.0 lb

## 2018-03-06 DIAGNOSIS — Z23 Encounter for immunization: Secondary | ICD-10-CM | POA: Diagnosis not present

## 2018-03-06 DIAGNOSIS — F419 Anxiety disorder, unspecified: Secondary | ICD-10-CM | POA: Diagnosis not present

## 2018-03-06 DIAGNOSIS — G4700 Insomnia, unspecified: Secondary | ICD-10-CM

## 2018-03-06 DIAGNOSIS — R5383 Other fatigue: Secondary | ICD-10-CM | POA: Insufficient documentation

## 2018-03-06 DIAGNOSIS — E785 Hyperlipidemia, unspecified: Secondary | ICD-10-CM

## 2018-03-06 DIAGNOSIS — Z0001 Encounter for general adult medical examination with abnormal findings: Secondary | ICD-10-CM | POA: Diagnosis not present

## 2018-03-06 DIAGNOSIS — Z0000 Encounter for general adult medical examination without abnormal findings: Secondary | ICD-10-CM

## 2018-03-06 DIAGNOSIS — Z800 Family history of malignant neoplasm of digestive organs: Secondary | ICD-10-CM

## 2018-03-06 LAB — BASIC METABOLIC PANEL
BUN: 12 mg/dL (ref 6–23)
CO2: 31 mEq/L (ref 19–32)
Calcium: 9.6 mg/dL (ref 8.4–10.5)
Chloride: 101 mEq/L (ref 96–112)
Creatinine, Ser: 0.96 mg/dL (ref 0.40–1.50)
GFR: 85.83 mL/min (ref >60.00)
Glucose, Bld: 96 mg/dL (ref 70–99)
Potassium: 3.8 mEq/L (ref 3.5–5.1)
Sodium: 140 mEq/L (ref 135–145)

## 2018-03-06 LAB — LIPID PANEL
Cholesterol: 265 mg/dL — ABNORMAL HIGH (ref 0–200)
HDL: 64.8 mg/dL (ref >39.00)
LDL Cholesterol: 183 mg/dL — ABNORMAL HIGH (ref 0–99)
NonHDL: 200.52
Total CHOL/HDL Ratio: 4
Triglycerides: 89 mg/dL (ref 0.0–149.0)
VLDL: 17.8 mg/dL (ref 0.0–40.0)

## 2018-03-06 LAB — HEPATIC FUNCTION PANEL
ALT: 29 U/L (ref 0–53)
AST: 23 U/L (ref 0–37)
Albumin: 4.5 g/dL (ref 3.5–5.2)
Alkaline Phosphatase: 51 U/L (ref 39–117)
Bilirubin, Direct: 0.1 mg/dL (ref 0.0–0.3)
Total Bilirubin: 0.7 mg/dL (ref 0.2–1.2)
Total Protein: 7 g/dL (ref 6.0–8.3)

## 2018-03-06 LAB — CBC WITH DIFFERENTIAL/PLATELET
Basophils Absolute: 0 10*3/uL (ref 0.0–0.1)
Basophils Relative: 0.3 % (ref 0.0–3.0)
Eosinophils Absolute: 0.2 10*3/uL (ref 0.0–0.7)
Eosinophils Relative: 2.8 % (ref 0.0–5.0)
HCT: 44.6 % (ref 39.0–52.0)
Hemoglobin: 15.4 g/dL (ref 13.0–17.0)
Lymphocytes Relative: 36.6 % (ref 12.0–46.0)
Lymphs Abs: 2.4 10*3/uL (ref 0.7–4.0)
MCHC: 34.5 g/dL (ref 30.0–36.0)
MCV: 93.1 fl (ref 78.0–100.0)
Monocytes Absolute: 0.5 10*3/uL (ref 0.1–1.0)
Monocytes Relative: 8.2 % (ref 3.0–12.0)
Neutro Abs: 3.4 10*3/uL (ref 1.4–7.7)
Neutrophils Relative %: 52.1 % (ref 43.0–77.0)
Platelets: 195 10*3/uL (ref 150.0–400.0)
RBC: 4.79 Mil/uL (ref 4.22–5.81)
RDW: 12.5 % (ref 11.5–15.5)
WBC: 6.5 10*3/uL (ref 4.0–10.5)

## 2018-03-06 LAB — URINALYSIS, ROUTINE W REFLEX MICROSCOPIC
Bilirubin Urine: NEGATIVE
Hgb urine dipstick: NEGATIVE
Ketones, ur: NEGATIVE
Leukocytes, UA: NEGATIVE
Nitrite: NEGATIVE
Specific Gravity, Urine: <=1.005 — AB (ref 1.000–1.030)
Total Protein, Urine: NEGATIVE
Urine Glucose: NEGATIVE
Urobilinogen, UA: 0.2 (ref 0.0–1.0)
pH: 6.5 (ref 5.0–8.0)

## 2018-03-06 LAB — TSH: TSH: 1.35 u[IU]/mL (ref 0.35–4.50)

## 2018-03-06 LAB — TESTOSTERONE: Testosterone: 417.43 ng/dL (ref 300.00–890.00)

## 2018-03-06 LAB — PSA: PSA: 1.13 ng/mL (ref 0.10–4.00)

## 2018-03-06 MED ORDER — ZOLPIDEM TARTRATE ER 12.5 MG PO TBCR: 12.5000 mg | EXTENDED_RELEASE_TABLET | Freq: Every evening | ORAL | 1 refills | Status: DC | PRN

## 2018-03-06 NOTE — Assessment & Plan Note (Signed)
Has been statin intolerant, for low chol diet 

## 2018-03-06 NOTE — Assessment & Plan Note (Signed)
Much improved with marked improvement of lbp s/p lumbar surgury jan 2019

## 2018-03-06 NOTE — Progress Notes (Signed)
Subjective:    Patient ID: Randy Moran, male    DOB: [birth year], 57 y.o.   MRN: [id number]  HPI  Here for wellness and f/u;  Overall doing ok;  Pt denies Chest pain, worsening SOB, DOE, wheezing, orthopnea, PND, worsening LE edema, palpitations, dizziness or syncope.  Pt denies neurological change such as new headache, facial or extremity weakness.  Pt denies polydipsia, polyuria, or low sugar symptoms. Pt states overall good compliance with treatment and medications, good tolerability, and has been trying to follow appropriate diet.  Pt denies worsening depressive symptoms, suicidal ideation or panic. No fever, night sweats, wt loss, loss of appetite, or other constitutional symptoms.  Pt states good ability with ADL's, has low fall risk, home safety reviewed and adequate, no other significant changes in hearing or vision, and only occasionally active with exercise.Due for colonoscopy. Does c/o ongoing fatigue, but denies signficant daytime hypersomnolence, asks for ambien change to cr 12.5. Past Medical History:  Diagnosis Date  . Anxiety   . ANXIETY 03/22/2007   Qualifier: Diagnosis of  By: Jenny Reichmann MD, Hunt Oris   . Asthma    as a child  . DJD (degenerative joint disease)   . GERD (gastroesophageal reflux disease)    in the past  . Glaucoma left eye  . Hx of colonic polyps   . Hyperlipidemia   . Insomnia   . Lower back pain   . Migraines    treated by [facility]  . Myofascial pain syndrome   . Prostatitis   . Psoriatic arthritis ([place]) 03/04/2015  . Restless leg syndrome 08/25/2012   Past Surgical History:  Procedure Laterality Date  . ABDOMINAL EXPOSURE N/A 07/01/2017   Procedure: ABDOMINAL EXPOSURE;  Surgeon: Rosetta Posner, MD;  Location: [facility] OR;  Service: Vascular;  Laterality: N/A;  . ANTERIOR LUMBAR FUSION N/A 07/01/2017   Procedure: Lumbar Five-Sacral one Anterior lumbar interbody fusion;  Surgeon: Kristeen Miss, MD;  Location: [facility];  Service: Neurosurgery;  Laterality: N/A;  .  APPENDECTOMY  1967  . bilateral inguinal herniorrhaphy     1960's  . bilateral shoulder surgery  2006, 2007,2010  . ganglion cyst surgery  2009  . HERNIA REPAIR  09/30/11   bilateral inguinal hernia  . knee surgery x 2  2004,2008  . LUMBAR SPINE SURGERY  07/01/2017   Lumbar Five-Sacral one Anterior lumbar interbody fusion (N/A Spine Lumbar)    reports that he has never smoked. He has never used smokeless tobacco. He reports that he does not drink alcohol or use drugs. family history includes COPD in his father; Cancer in his maternal aunt and maternal uncle; Colon cancer in his maternal uncle and mother; Coronary artery disease in his unknown relative; Hyperlipidemia in his unknown relative; Hypertension in his unknown relative. Allergies  Allergen Reactions  . Statins Other (See Comments)    Fatigue and weakness  . Sulfamethoxazole-Trimethoprim     REACTION: rash  . Levofloxacin Rash   Current Outpatient Medications on File Prior to Visit  Medication Sig Dispense Refill  . acetaminophen (TYLENOL) 500 MG tablet Take 500 mg by mouth every 6 (six) hours as needed for mild pain, moderate pain or headache.     Marland Kitchen EMGALITY 120 MG/ML SOAJ     . HUMIRA PEN 40 MG/0.8ML PNKT Inject 40 mg into the muscle every 14 (fourteen) days.     Marland Kitchen ibuprofen (ADVIL,MOTRIN) 200 MG tablet Take 200 mg by mouth every 8 (eight) hours as needed for mild  pain or moderate pain.    Marland Kitchen latanoprost (XALATAN) 0.005 % ophthalmic solution Place 1 drop into both eyes at bedtime.     . Multiple Vitamins-Minerals (MULTIVITAMIN WITH MINERALS) tablet Take 1 tablet by mouth daily.    . [DISCONTINUED] chlorproMAZINE (THORAZINE) 10 MG tablet As needed for migraine headache      No current facility-administered medications on file prior to visit.    Review of Systems Constitutional: Negative for other unusual diaphoresis, sweats, appetite or weight changes HENT: Negative for other worsening hearing loss, ear pain, facial  swelling, mouth sores or neck stiffness.   Eyes: Negative for other worsening pain, redness or other visual disturbance.  Respiratory: Negative for other stridor or swelling Cardiovascular: Negative for other palpitations or other chest pain  Gastrointestinal: Negative for worsening diarrhea or loose stools, blood in stool, distention or other pain Genitourinary: Negative for hematuria, flank pain or other change in urine volume.  Musculoskeletal: Negative for myalgias or other joint swelling.  Skin: Negative for other color change, or other wound or worsening drainage.  Neurological: Negative for other syncope or numbness. Hematological: Negative for other adenopathy or swelling Psychiatric/Behavioral: Negative for hallucinations, other worsening agitation, SI, self-injury, or new decreased concentration All other system neg per pt    Objective:   Physical Exam BP 102/70 (BP Location: Left Arm, Patient Position: Sitting, Cuff Size: Normal)   Pulse (!) 103   Temp 98.7 F (37.1 C) (Oral)   Ht 5\' 10"  (1.778 m)   Wt 158 lb (71.7 kg)   SpO2 98%   BMI 22.67 kg/m  VS noted,  Constitutional: Pt is oriented to person, place, and time. Appears well-developed and well-nourished, in no significant distress and comfortable Head: Normocephalic and atraumatic  Eyes: Conjunctivae and EOM are normal. Pupils are equal, round, and reactive to light Right Ear: External ear normal without discharge Left Ear: External ear normal without discharge Nose: Nose without discharge or deformity Mouth/Throat: Oropharynx is without other ulcerations and moist  Neck: Normal range of motion. Neck supple. No JVD present. No tracheal deviation present or significant neck LA or mass Cardiovascular: Normal rate, regular rhythm, normal heart sounds and intact distal pulses.   Pulmonary/Chest: WOB normal and breath sounds without rales or wheezing  Abdominal: Soft. Bowel sounds are normal. NT. No HSM  Musculoskeletal:  Normal range of motion. Exhibits no edema Lymphadenopathy: Has no other cervical adenopathy.  Neurological: Pt is alert and oriented to person, place, and time. Pt has normal reflexes. No cranial nerve deficit. Motor grossly intact, Gait intact Skin: Skin is warm and dry. No rash noted or new ulcerations Psychiatric:  Has normal mood and affect. Behavior is normal without agitation All other system neg per pt Lab Results  Component Value Date   WBC 6.5 03/06/2018   HGB 15.4 03/06/2018   HCT 44.6 03/06/2018   PLT 195.0 03/06/2018   GLUCOSE 96 03/06/2018   CHOL 265 (H) 03/06/2018   TRIG 89.0 03/06/2018   HDL 64.80 03/06/2018   LDLDIRECT 135.3 08/24/2012   LDLCALC 183 (H) 03/06/2018   ALT 29 03/06/2018   AST 23 03/06/2018   NA 140 03/06/2018   K 3.8 03/06/2018   CL 101 03/06/2018   CREATININE 0.96 03/06/2018   BUN 12 03/06/2018   CO2 31 03/06/2018   TSH 1.35 03/06/2018   PSA 1.13 03/06/2018         Assessment & Plan:

## 2018-03-06 NOTE — Patient Instructions (Addendum)
You will be contacted regarding the referral for: colonoscopy  OK to change the Ambien 10 mg to the CR 12.5 mg at bedtime as needed  Please continue all other medications as before, and refills have been done if requested.  Please have the pharmacy call with any other refills you may need.  Please continue your efforts at being more active, low cholesterol diet, and weight control.  You are otherwise up to date with prevention measures today.  Please keep your appointments with your specialists as you may have planned  Please go to the LAB in the Basement (turn left off the elevator) for the tests to be done today  You will be contacted by phone if any changes need to be made immediately.  Otherwise, you will receive a letter about your results with an explanation, but please check with MyChart first.  Please remember to sign up for MyChart if you have not done so, as this will be important to you in the future with finding out test results, communicating by private email, and scheduling acute appointments online when needed.  Please return in 1 year for your yearly visit, or sooner if needed, with Lab testing done 3-5 days before

## 2018-03-06 NOTE — Assessment & Plan Note (Signed)
[place] for change to [employer] cr 12.5

## 2018-03-06 NOTE — Assessment & Plan Note (Signed)
Ok for testosterone level

## 2018-03-06 NOTE — Assessment & Plan Note (Signed)

## 2018-03-15 ENCOUNTER — Encounter: Payer: Self-pay | Admitting: Internal Medicine

## 2018-03-17 ENCOUNTER — Encounter: Payer: Self-pay | Admitting: Internal Medicine

## 2018-03-20 DIAGNOSIS — Z79899 Other long term (current) drug therapy: Secondary | ICD-10-CM | POA: Diagnosis not present

## 2018-03-20 DIAGNOSIS — L4050 Arthropathic psoriasis, unspecified: Secondary | ICD-10-CM | POA: Diagnosis not present

## 2018-03-20 DIAGNOSIS — M5136 Other intervertebral disc degeneration, lumbar region: Secondary | ICD-10-CM | POA: Diagnosis not present

## 2018-04-06 ENCOUNTER — Encounter: Payer: Self-pay | Admitting: Internal Medicine

## 2018-04-10 ENCOUNTER — Other Ambulatory Visit: Payer: Self-pay

## 2018-04-10 ENCOUNTER — Emergency Department (HOSPITAL_COMMUNITY): Payer: 59

## 2018-04-10 ENCOUNTER — Encounter (HOSPITAL_COMMUNITY): Payer: Self-pay | Admitting: *Deleted

## 2018-04-10 ENCOUNTER — Emergency Department (HOSPITAL_COMMUNITY)
Admission: EM | Admit: 2018-04-10 | Discharge: 2018-04-10 | Disposition: A | Discharge status: Home / Self Care | Payer: 59 | Attending: Emergency Medicine | Admitting: Emergency Medicine

## 2018-04-10 DIAGNOSIS — R110 Nausea: Secondary | ICD-10-CM | POA: Diagnosis not present

## 2018-04-10 DIAGNOSIS — Z79899 Other long term (current) drug therapy: Secondary | ICD-10-CM | POA: Diagnosis not present

## 2018-04-10 DIAGNOSIS — R61 Generalized hyperhidrosis: Secondary | ICD-10-CM | POA: Diagnosis not present

## 2018-04-10 DIAGNOSIS — J45909 Unspecified asthma, uncomplicated: Secondary | ICD-10-CM | POA: Insufficient documentation

## 2018-04-10 DIAGNOSIS — Y999 Unspecified external cause status: Secondary | ICD-10-CM | POA: Insufficient documentation

## 2018-04-10 DIAGNOSIS — Y939 Activity, unspecified: Secondary | ICD-10-CM | POA: Insufficient documentation

## 2018-04-10 DIAGNOSIS — X58XXXA Exposure to other specified factors, initial encounter: Secondary | ICD-10-CM | POA: Diagnosis not present

## 2018-04-10 DIAGNOSIS — T148XXA Other injury of unspecified body region, initial encounter: Secondary | ICD-10-CM

## 2018-04-10 DIAGNOSIS — Y929 Unspecified place or not applicable: Secondary | ICD-10-CM | POA: Insufficient documentation

## 2018-04-10 DIAGNOSIS — R531 Weakness: Secondary | ICD-10-CM | POA: Diagnosis not present

## 2018-04-10 DIAGNOSIS — S29011A Strain of muscle and tendon of front wall of thorax, initial encounter: Secondary | ICD-10-CM | POA: Diagnosis not present

## 2018-04-10 DIAGNOSIS — R0789 Other chest pain: Secondary | ICD-10-CM

## 2018-04-10 DIAGNOSIS — R079 Chest pain, unspecified: Secondary | ICD-10-CM | POA: Diagnosis not present

## 2018-04-10 DIAGNOSIS — S299XXA Unspecified injury of thorax, initial encounter: Secondary | ICD-10-CM | POA: Diagnosis present

## 2018-04-10 LAB — BASIC METABOLIC PANEL
Anion gap: 8 (ref 5–15)
BUN: 8 mg/dL (ref 6–20)
CO2: 28 mmol/L (ref 22–32)
Calcium: 9.7 mg/dL (ref 8.9–10.3)
Chloride: 106 mmol/L (ref 98–111)
Creatinine, Ser: 0.98 mg/dL (ref 0.61–1.24)
GFR calc Af Amer: >60 mL/min (ref >60)
GFR calc non Af Amer: >60 mL/min (ref >60)
Glucose, Bld: 101 mg/dL — ABNORMAL HIGH (ref 70–99)
Potassium: 3.9 mmol/L (ref 3.5–5.1)
Sodium: 142 mmol/L (ref 135–145)

## 2018-04-10 LAB — I-STAT TROPONIN, ED
Troponin i, poc: 0 ng/mL (ref 0.00–0.08)
Troponin i, poc: 0.01 ng/mL (ref 0.00–0.08)

## 2018-04-10 LAB — CBC
HCT: 41.7 % (ref 39.0–52.0)
Hemoglobin: 13.9 g/dL (ref 13.0–17.0)
MCH: 31.9 pg (ref 26.0–34.0)
MCHC: 33.3 g/dL (ref 30.0–36.0)
MCV: 95.6 fL (ref 80.0–100.0)
Platelets: 184 10*3/uL (ref 150–400)
RBC: 4.36 MIL/uL (ref 4.22–5.81)
RDW: 12.1 % (ref 11.5–15.5)
WBC: 5.2 10*3/uL (ref 4.0–10.5)
nRBC: 0 % (ref 0.0–0.2)

## 2018-04-10 NOTE — ED Provider Notes (Signed)
[facility] DEPT Provider Note  CSN: [id number] Arrival date & time: 04/10/18 0411  Chief Complaint(s) Chest Pain  HPI Randy Moran is a 57 y.o. male   The history is provided by the patient.  Chest Pain   This is a new problem. The current episode started 3 to 5 hours ago. The problem occurs constantly. The problem has been gradually improving. The pain is present in the lateral region (left). The pain is moderate. The quality of the pain is described as sharp. The pain radiates to the left neck (felt like it started in the neck and radiated down to chest). The symptoms are aggravated by certain positions. Associated symptoms include diaphoresis and nausea. Pertinent negatives include no back pain, no cough, no fever, no lower extremity edema, no malaise/fatigue, no shortness of breath and no sputum production. Treatments tried: ASA. The treatment provided moderate relief.  His past medical history is significant for hyperlipidemia.  Pertinent negatives for past medical history include no CAD, no diabetes, no hypertension, no MI, no PE, no strokes and no TIA.    Past Medical History Past Medical History:  Diagnosis Date  . Anxiety   . ANXIETY 03/22/2007   Qualifier: Diagnosis of  By: Jenny Reichmann MD, Hunt Oris   . Asthma    as a child  . DJD (degenerative joint disease)   . GERD (gastroesophageal reflux disease)    in the past  . Glaucoma left eye  . Hx of colonic polyps   . Hyperlipidemia   . Insomnia   . Lower back pain   . Migraines    treated by [facility]  . Myofascial pain syndrome   . Prostatitis   . Psoriatic arthritis ([place]) 03/04/2015  . Restless leg syndrome 08/25/2012   Patient Active Problem List   Diagnosis Date Noted  . Fatigue 03/06/2018  . 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 Anxiety 08/06/2016  . Insomnia 08/06/2016  . Tarsal tunnel syndrome of right side 07/30/2016  . Other specified  arthritis, right shoulder 03/25/2016  . 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 Preventative health care 03/04/2015  . Psoriatic arthritis ([place]) 03/04/2015  . GERD (gastroesophageal reflux disease) 03/04/2015  . Migraine - managed by Dr. Ir[facility] 09/28/2013  . Glaucoma - [facility] by [facility] 09/28/2013  . DEGENERATIVE JOINT DISEASE - Managed by Dr. Nelva Bush at [place] 04/07/2008  . Hyperlipidemia 03/22/2007  . LOW BACK PAIN 03/22/2007   Home Medication(s) Prior to Admission medications   Medication Sig Start Date End Date Taking? Authorizing Provider  acetaminophen (TYLENOL) 500 MG tablet Take 500 mg by mouth every 6 (six) hours as needed for mild pain, moderate pain or headache.     [provider]  EMGALITY 120 MG/ML SOAJ  02/24/18   [provider]  HUMIRA PEN 40 MG/0.8ML PNKT Inject 40 mg into the muscle every 14 (fourteen) days.  02/26/15   [provider]  ibuprofen (ADVIL,MOTRIN) 200 MG tablet Take 200 mg by mouth every 8 (eight) hours as needed for mild pain or moderate pain.    [provider]  latanoprost (XALATAN) 0.005 % ophthalmic solution Place 1 drop into both eyes at bedtime.  03/22/11   [provider]  Multiple Vitamins-Minerals (MULTIVITAMIN WITH MINERALS) tablet Take 1 tablet by mouth daily.    [provider]  zolpidem (AMBIEN CR) 12.5 MG CR tablet Take 1 tablet (12.5 mg total) by mouth at bedtime as  needed for sleep. 03/06/18 04/05/18  Biagio Borg, MD  chlorproMAZINE (THORAZINE) 10 MG tablet As needed for migraine headache   08/30/11  [provider]                                                                                                                                    Past Surgical History Past Surgical History:  Procedure Laterality Date  . ABDOMINAL EXPOSURE N/A 07/01/2017   Procedure: ABDOMINAL EXPOSURE;  Surgeon: Rosetta Posner, MD;  Location: [facility] OR;  Service:  Vascular;  Laterality: N/A;  . ANTERIOR LUMBAR FUSION N/A 07/01/2017   Procedure: Lumbar Five-Sacral one Anterior lumbar interbody fusion;  Surgeon: Kristeen Miss, MD;  Location: [facility];  Service: Neurosurgery;  Laterality: N/A;  . APPENDECTOMY  1967  . bilateral inguinal herniorrhaphy     1960's  . bilateral shoulder surgery  2006, 2007,2010  . ganglion cyst surgery  2009  . HERNIA REPAIR  09/30/11   bilateral inguinal hernia  . knee surgery x 2  2004,2008  . LUMBAR SPINE SURGERY  07/01/2017   Lumbar Five-Sacral one Anterior lumbar interbody fusion (N/A Spine Lumbar)   Family History Family History  Problem Relation Age of Onset  . Colon cancer Mother   . COPD Father   . Coronary artery disease Unknown   . Hyperlipidemia Unknown   . Hypertension Unknown   . Cancer Maternal Aunt        breast  . Cancer Maternal Uncle        prostate  . Colon cancer Maternal Uncle   . Esophageal cancer Neg Hx   . Rectal cancer Neg Hx   . Stomach cancer Neg Hx     Social History Social History   Tobacco Use  . Smoking status: Never Smoker  . Smokeless tobacco: Never Used  Substance Use Topics  . Alcohol use: No  . Drug use: No   Allergies Statins; Sulfamethoxazole-trimethoprim; and Levofloxacin  Review of Systems Review of Systems  Constitutional: Positive for diaphoresis. Negative for fever and malaise/fatigue.  Respiratory: Negative for cough, sputum production and shortness of breath.   Cardiovascular: Positive for chest pain.  Gastrointestinal: Positive for nausea.  Musculoskeletal: Negative for back pain.   All other systems are reviewed and are negative for acute change except as noted in the HPI  Physical Exam Vital Signs  I have reviewed the triage vital signs BP (!) 143/98 (BP Location: Left Arm)   Pulse 71   Resp 18   Ht 5\' 9"  (1.753 m)   Wt 71.2 kg   SpO2 100%   BMI 23.18 kg/m   Physical Exam  Constitutional: He is oriented to person, place, and time. He  appears well-developed and well-nourished. No distress.  HENT:  Head: Normocephalic and atraumatic.  Nose: Nose normal.  Eyes: Pupils are equal, round, and reactive to light. Conjunctivae and EOM are normal. Right eye exhibits  no discharge. Left eye exhibits no discharge. No scleral icterus.  Neck: Normal range of motion. Neck supple.  Cardiovascular: Normal rate and regular rhythm. Exam reveals no gallop and no friction rub.  No murmur heard. Pulmonary/Chest: Effort normal and breath sounds normal. No stridor. No respiratory distress. He has no rales. He exhibits tenderness.    Abdominal: Soft. He exhibits no distension. There is no tenderness.  Musculoskeletal: He exhibits no edema or tenderness.       Cervical back: He exhibits pain and spasm.       Back:  Neurological: He is alert and oriented to person, place, and time.  Skin: Skin is warm and dry. No rash noted. He is not diaphoretic. No erythema.  Psychiatric: He has a normal mood and affect.  Vitals reviewed.   ED Results and Treatments Labs (all labs ordered are listed, but only abnormal results are displayed) Labs Reviewed  BASIC METABOLIC PANEL - Abnormal; Notable for the following components:      Result Value   Glucose, Bld 101 (*)    All other components within normal limits  CBC  I-STAT TROPONIN, ED  I-STAT TROPONIN, ED                                                                                                                         EKG  EKG Interpretation  Date/Time:  Monday April 10 2018 04:25:09 EDT Ventricular Rate:  64 PR Interval:  122 QRS Duration: 80 QT Interval:  394 QTC Calculation: 406 R Axis:   63 Text Interpretation:  Normal sinus rhythm Normal ECG NO STEMI No old tracing to compare Confirmed by Addison Lank [phone]) on 04/10/2018 5:00:48 AM      Radiology Dg Chest 2 View  Result Date: 04/10/2018 CLINICAL DATA:  57 y/o M; left-sided chest pain radiating into the left neck. EXAM:  CHEST - 2 VIEW COMPARISON:  None. FINDINGS: The heart size and mediastinal contours are within normal limits. Both lungs are clear. The visualized skeletal structures are unremarkable. IMPRESSION: No acute pulmonary process identified. Electronically Signed   By: Kristine Garbe M.D.   On: 04/10/2018 05:50   Pertinent labs & imaging results that were available during my care of the patient were reviewed by me and considered in my medical decision making (see chart for details).  Medications Ordered in ED Medications - No data to display  Procedures Procedures  (including critical care time)  Medical Decision Making / ED Course I have reviewed the nursing notes for this encounter and the patient's prior records (if available in EHR or on provided paperwork).    Atypical chest pain.  EKG without acute ischemic changes or evidence of pericarditis.  Initial troponin negative.  Heart score less than 4.  Appropriate for delta troponin  Low suspicion for pulmonary embolism.  Presentation not classic for aortic dissection or esophageal perforation.  Chest x-ray without evidence suggestive of pneumonia, pneumothorax, pneumomediastinum.  No abnormal contour of the mediastinum to suggest dissection. No evidence of acute injuries.  Feel this is most consistent with muscular pain.  Delta troponin pending.  Patient care turned over to Dr Maryan Rued at 0800. Patient case and results discussed in detail; please see their note for further ED managment.      Final Clinical Impression(s) / ED Diagnoses Final diagnoses:  Chest wall pain  Muscle strain      This chart was dictated using voice recognition software.  Despite best efforts to proofread,  errors can occur which can change the documentation meaning.   Fatima Blank, MD 04/10/18 [phone]

## 2018-04-10 NOTE — ED Provider Notes (Signed)
Delta trop is neg and pt d/ced home.   Blanchie Dessert, MD 04/10/18 [phone]

## 2018-04-10 NOTE — Discharge Instructions (Addendum)
You may use over-the-counter Motrin (Ibuprofen), Acetaminophen (Tylenol), topical muscle creams such as SalonPas, Icy Hot, Bengay, etc. Please stretch, apply heat, and have massage therapy for additional assistance. ° °

## 2018-04-10 NOTE — ED Notes (Signed)
Pt. Stated that he took 325mg  Aspirin  PO at 0315 this morning.

## 2018-04-10 NOTE — ED Notes (Signed)
Pt also stated "took a 325 mg ASA about 0330.  My legs are weak too."

## 2018-04-10 NOTE — ED Triage Notes (Signed)
Pt stated "I took something for indigestion before I went to bed, woke up sweating, nauseated, left jaw hurting."  Pt c/o left side c/p pain that radiates into left neck.

## 2018-04-14 ENCOUNTER — Other Ambulatory Visit: Payer: Self-pay | Admitting: Internal Medicine

## 2018-04-16 ENCOUNTER — Other Ambulatory Visit: Payer: Self-pay | Admitting: Internal Medicine

## 2018-04-21 ENCOUNTER — Encounter: Payer: 59 | Admitting: Internal Medicine

## 2018-05-03 ENCOUNTER — Encounter: Payer: Self-pay | Admitting: Internal Medicine

## 2018-05-03 ENCOUNTER — Ambulatory Visit (AMBULATORY_SURGERY_CENTER): Payer: Self-pay | Admitting: *Deleted

## 2018-05-03 ENCOUNTER — Other Ambulatory Visit: Payer: Self-pay

## 2018-05-03 VITALS — Ht 70.0 in | Wt 162.0 lb

## 2018-05-03 DIAGNOSIS — Z800 Family history of malignant neoplasm of digestive organs: Secondary | ICD-10-CM

## 2018-05-03 DIAGNOSIS — Z86010 Personal history of colonic polyps: Secondary | ICD-10-CM

## 2018-05-03 NOTE — Progress Notes (Signed)
No egg or soy allergy known to patient  No issues with past sedation with any surgeries  or procedures, no intubation problems  No diet pills per patient No home 02 use per patient  No blood thinners per patient  Pt denies issues with constipation  No A fib or A flutter  EMMI video offered and declined 

## 2018-05-17 ENCOUNTER — Ambulatory Visit (AMBULATORY_SURGERY_CENTER): Payer: 59 | Admitting: Internal Medicine

## 2018-05-17 ENCOUNTER — Encounter: Payer: Self-pay | Admitting: Internal Medicine

## 2018-05-17 VITALS — BP 106/60 | HR 67 | Temp 97.8°F | Resp 17 | Ht 70.0 in | Wt 158.0 lb

## 2018-05-17 DIAGNOSIS — Z1211 Encounter for screening for malignant neoplasm of colon: Secondary | ICD-10-CM | POA: Diagnosis not present

## 2018-05-17 DIAGNOSIS — Z800 Family history of malignant neoplasm of digestive organs: Secondary | ICD-10-CM | POA: Diagnosis not present

## 2018-05-17 DIAGNOSIS — Z86010 Personal history of colonic polyps: Secondary | ICD-10-CM | POA: Diagnosis not present

## 2018-05-17 MED ORDER — SODIUM CHLORIDE 0.9 % IV SOLN: 500.0000 mL | Freq: Once | INTRAVENOUS | Status: DC

## 2018-05-17 NOTE — Patient Instructions (Addendum)
    No polyps (or cancer) today.  Your next routine colonoscopy should be in 5 years - 2024.  I appreciate the opportunity to care for you. Gatha Mayer, MD, FACG   YOU HAD AN ENDOSCOPIC PROCEDURE TODAY AT [facility]:   Refer to the procedure report that was given to you for any specific questions about what was found during the examination.  If the procedure report does not answer your questions, please call your gastroenterologist to clarify.  If you requested that your care partner not be given the details of your procedure findings, then the procedure report has been included in a sealed envelope for you to review at your convenience later.  YOU SHOULD EXPECT: Some feelings of bloating in the abdomen. Passage of more gas than usual.  Walking can help get rid of the air that was put into your GI tract during the procedure and reduce the bloating. If you had a lower endoscopy (such as a colonoscopy or flexible sigmoidoscopy) you may notice spotting of blood in your stool or on the toilet paper. If you underwent a bowel prep for your procedure, you may not have a normal bowel movement for a few days.  Please Note:  You might notice some irritation and congestion in your nose or some drainage.  This is from the oxygen used during your procedure.  There is no need for concern and it should clear up in a day or so.  SYMPTOMS TO REPORT IMMEDIATELY:   Following lower endoscopy (colonoscopy or flexible sigmoidoscopy):  Excessive amounts of blood in the stool  Significant tenderness or worsening of abdominal pains  Swelling of the abdomen that is new, acute  Fever of 100F or higher  For urgent or emergent issues, a gastroenterologist can be reached at any hour by calling [phone].   DIET:  We do recommend a small meal at first, but then you may proceed to your regular diet.  Drink plenty of fluids but you should avoid alcoholic beverages for 24  hours.  ACTIVITY:  You should plan to take it easy for the rest of today and you should NOT DRIVE or use heavy machinery until tomorrow (because of the sedation medicines used during the test).    FOLLOW UP: Our staff will call the number listed on your records the next business day following your procedure to check on you and address any questions or concerns that you may have regarding the information given to you following your procedure. If we do not reach you, we will leave a message.  However, if you are feeling well and you are not experiencing any problems, there is no need to return our call.  We will assume that you have returned to your regular daily activities without incident.  If any biopsies were taken you will be contacted by phone or by letter within the next 1-3 weeks.  Please call us at [phone] if you have not heard about the biopsies in 3 weeks.    SIGNATURES/CONFIDENTIALITY: You and/or your care partner have signed paperwork which will be entered into your electronic medical record.  These signatures attest to the fact that that the information above on your After Visit Summary has been reviewed and is understood.  Full responsibility of the confidentiality of this discharge information lies with you and/or your care-partner.  Read all handouts given to you by your recovery room nurse.

## 2018-05-17 NOTE — Progress Notes (Signed)
To RR, report to RN. VSS, no complaints voiced.

## 2018-05-18 ENCOUNTER — Telehealth: Payer: Self-pay

## 2018-05-18 NOTE — Telephone Encounter (Signed)
No answer, left message to call back later today, B.Junette Bernat RN. 

## 2018-05-18 NOTE — Telephone Encounter (Signed)
Second follow up call attempt, no answer, message left 

## 2018-05-18 NOTE — Op Note (Signed)
[facility] Patient Name: Jaquille Kau Procedure Date: 05/17/2018 12:27 PM MRN: [id number] Endoscopist: Gatha Mayer , MD Age: 57 Referring MD:  Date of Birth: 04-Oct-1960 Gender: Male Account #: [id number] Procedure:                Colonoscopy Indications:              Screening in patient at increased risk: Family                            history of 1st-degree relative with colorectal                            cancer Medicines:                Propofol per Anesthesia, Monitored Anesthesia Care Procedure:                Pre-Anesthesia Assessment:                           - Prior to the procedure, a History and Physical                            was performed, and patient medications and                            allergies were reviewed. The patient's tolerance of                            previous anesthesia was also reviewed. The risks                            and benefits of the procedure and the sedation                            options and risks were discussed with the patient.                            All questions were answered, and informed consent                            was obtained. Prior Anticoagulants: The patient has                            taken no previous anticoagulant or antiplatelet                            agents. ASA Grade Assessment: II - A patient with                            mild systemic disease. After reviewing the risks                            and benefits, the patient was deemed in  satisfactory condition to undergo the procedure.                           After obtaining informed consent, the colonoscope                            was passed under direct vision. Throughout the                            procedure, the patient's blood pressure, pulse, and                            oxygen saturations were monitored continuously. The                            Model CF-HQ190L [id number])  scope was introduced                            through the anus and advanced to the the cecum,                            identified by appendiceal orifice and ileocecal                            valve. The ileocecal valve, appendiceal orifice,                            and rectum were photographed. The quality of the                            bowel preparation was good. The colonoscopy was                            performed without difficulty. The patient tolerated                            the procedure well. The bowel preparation used was                            Miralax. Scope In: 12:32:03 PM Scope Out: 12:44:53 PM Scope Withdrawal Time: 0 hours 7 minutes 34 seconds  Total Procedure Duration: 0 hours 12 minutes 50 seconds  Findings:                 The perianal and digital rectal examinations were                            normal. Pertinent negatives include normal prostate                            (size, shape, and consistency).                           The colon (entire examined portion) appeared normal.  No additional abnormalities were found on                            retroflexion. Complications:            No immediate complications. Estimated blood loss:                            None. Estimated Blood Loss:     Estimated blood loss: none. Recommendation:           - Repeat colonoscopy in 5 years for screening                            purposes.                           - Patient has a contact number available for                            emergencies. The signs and symptoms of potential                            delayed complications were discussed with the                            patient. Return to normal activities tomorrow.                            Written discharge instructions were provided to the                            patient.                           - Resume previous diet.                           - Continue  present medications. Gatha Mayer, MD 05/17/2018 12:48:49 PM This report has been signed electronically.

## 2018-05-31 ENCOUNTER — Encounter: Payer: Self-pay | Admitting: Internal Medicine

## 2018-06-09 ENCOUNTER — Ambulatory Visit: Payer: 59 | Admitting: Family Medicine

## 2018-06-10 ENCOUNTER — Other Ambulatory Visit: Payer: Self-pay | Admitting: Internal Medicine

## 2018-06-12 ENCOUNTER — Other Ambulatory Visit: Payer: Self-pay | Admitting: Internal Medicine

## 2018-06-19 ENCOUNTER — Telehealth: Payer: Self-pay | Admitting: Family Medicine

## 2018-06-19 NOTE — Telephone Encounter (Signed)
Please let pt know I usually advise against frequent transfers for continuity of care Please let him know I am part time now and am quite full. If he insists and is ok with my part time schedule please set up NPV in May or June if possible - cont with current PCP in interim advised if possible. Thanks.

## 2018-06-19 NOTE — Telephone Encounter (Signed)
Copied from [place] [id number]. Topic: Appointment Scheduling - Transfer of Care >> Jun 19, 2018  9:14 AM Pilar Grammes F wrote: Pt is requesting to transfer FROM:  Dr. Cathlean Cower Pt is requesting to transfer TO: Dr. Colin Benton Reason for requested transfer:   Patient was formerly a patient of Dr. Maudie Mercury but thought it would easier for him and his wife to have the same doctor, but the patient is missing Dr. Maudie Mercury and would like to transfer back to Dr. Maudie Mercury.  Send CRM to patient's current PCP (transferring FROM).

## 2018-06-19 NOTE — Telephone Encounter (Signed)
Please advise 

## 2018-07-03 ENCOUNTER — Encounter: Payer: Self-pay | Admitting: Internal Medicine

## 2018-07-03 MED ORDER — ROSUVASTATIN CALCIUM 20 MG PO TABS: 20.0000 mg | ORAL_TABLET | Freq: Every day | ORAL | 3 refills | Status: DC

## 2018-07-10 ENCOUNTER — Encounter: Payer: Self-pay | Admitting: Internal Medicine

## 2018-07-11 MED ORDER — ZOLPIDEM TARTRATE 10 MG PO TABS: 10.0000 mg | ORAL_TABLET | Freq: Every evening | ORAL | 1 refills | Status: DC | PRN

## 2018-07-14 ENCOUNTER — Other Ambulatory Visit: Payer: Self-pay | Admitting: Internal Medicine

## 2018-07-31 ENCOUNTER — Other Ambulatory Visit: Payer: Self-pay | Admitting: Internal Medicine

## 2018-08-04 IMAGING — DX DG ANKLE COMPLETE 3+V*R*
3 series · 3 of 3 positions shown · non-contrast
Comparison: None.

CLINICAL DATA: Chronic pain with numbness

EXAM:
RIGHT ANKLE - COMPLETE 3+ VIEW

[ankle ap]
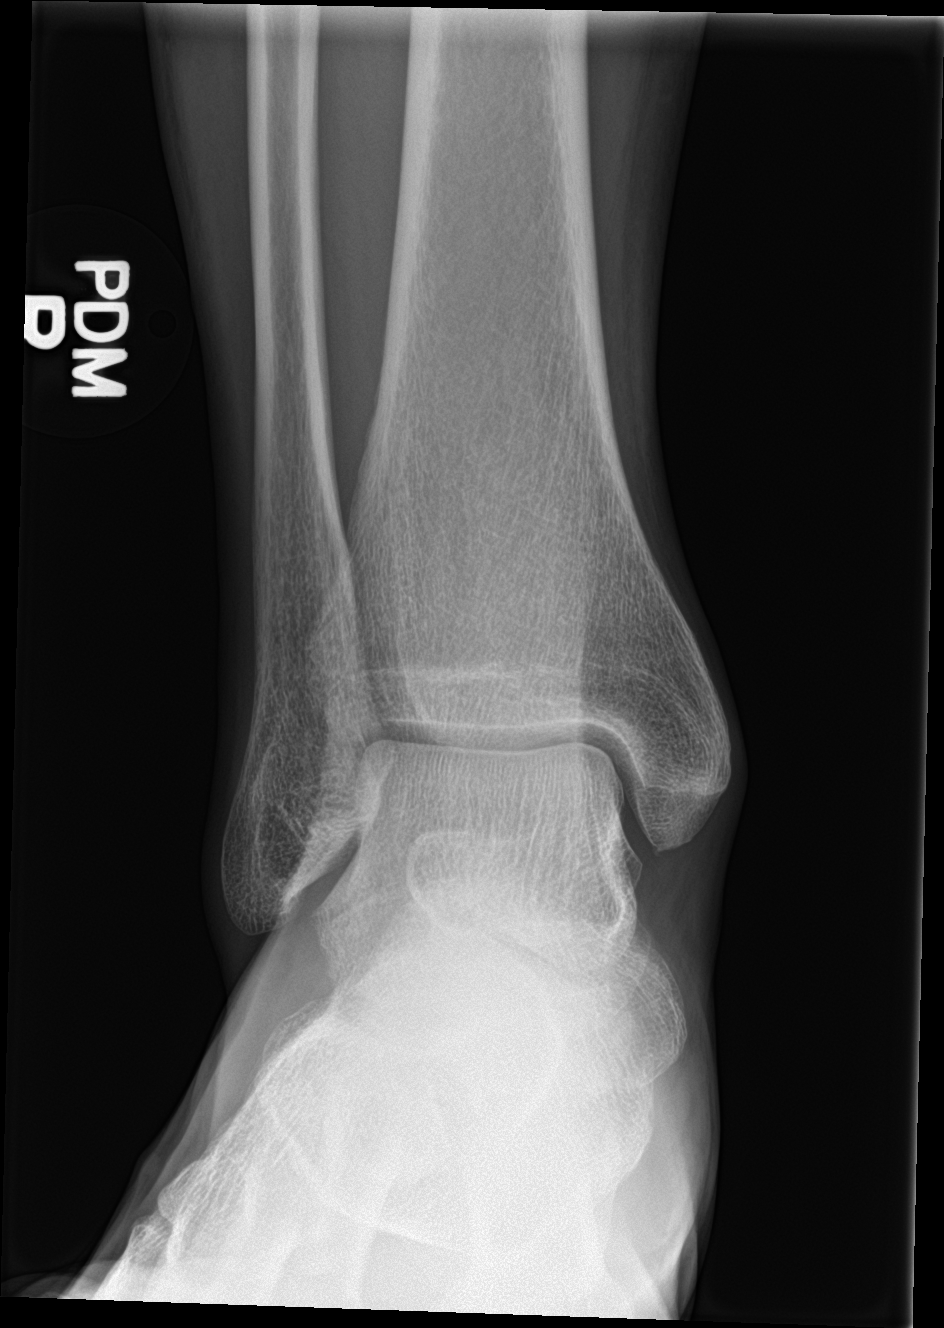

[ankle obl]
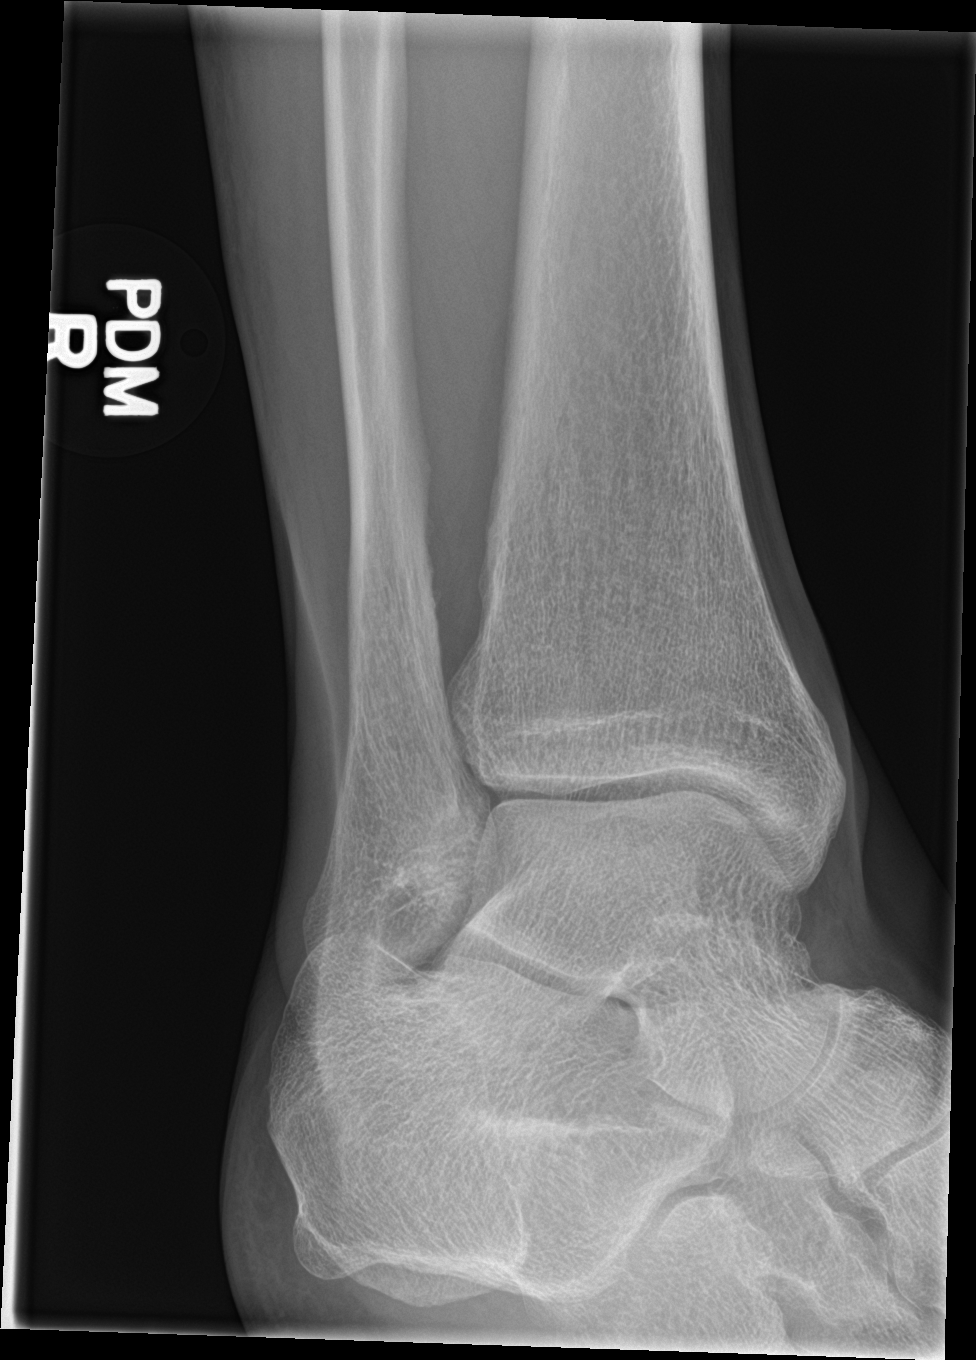

[ankle lat]
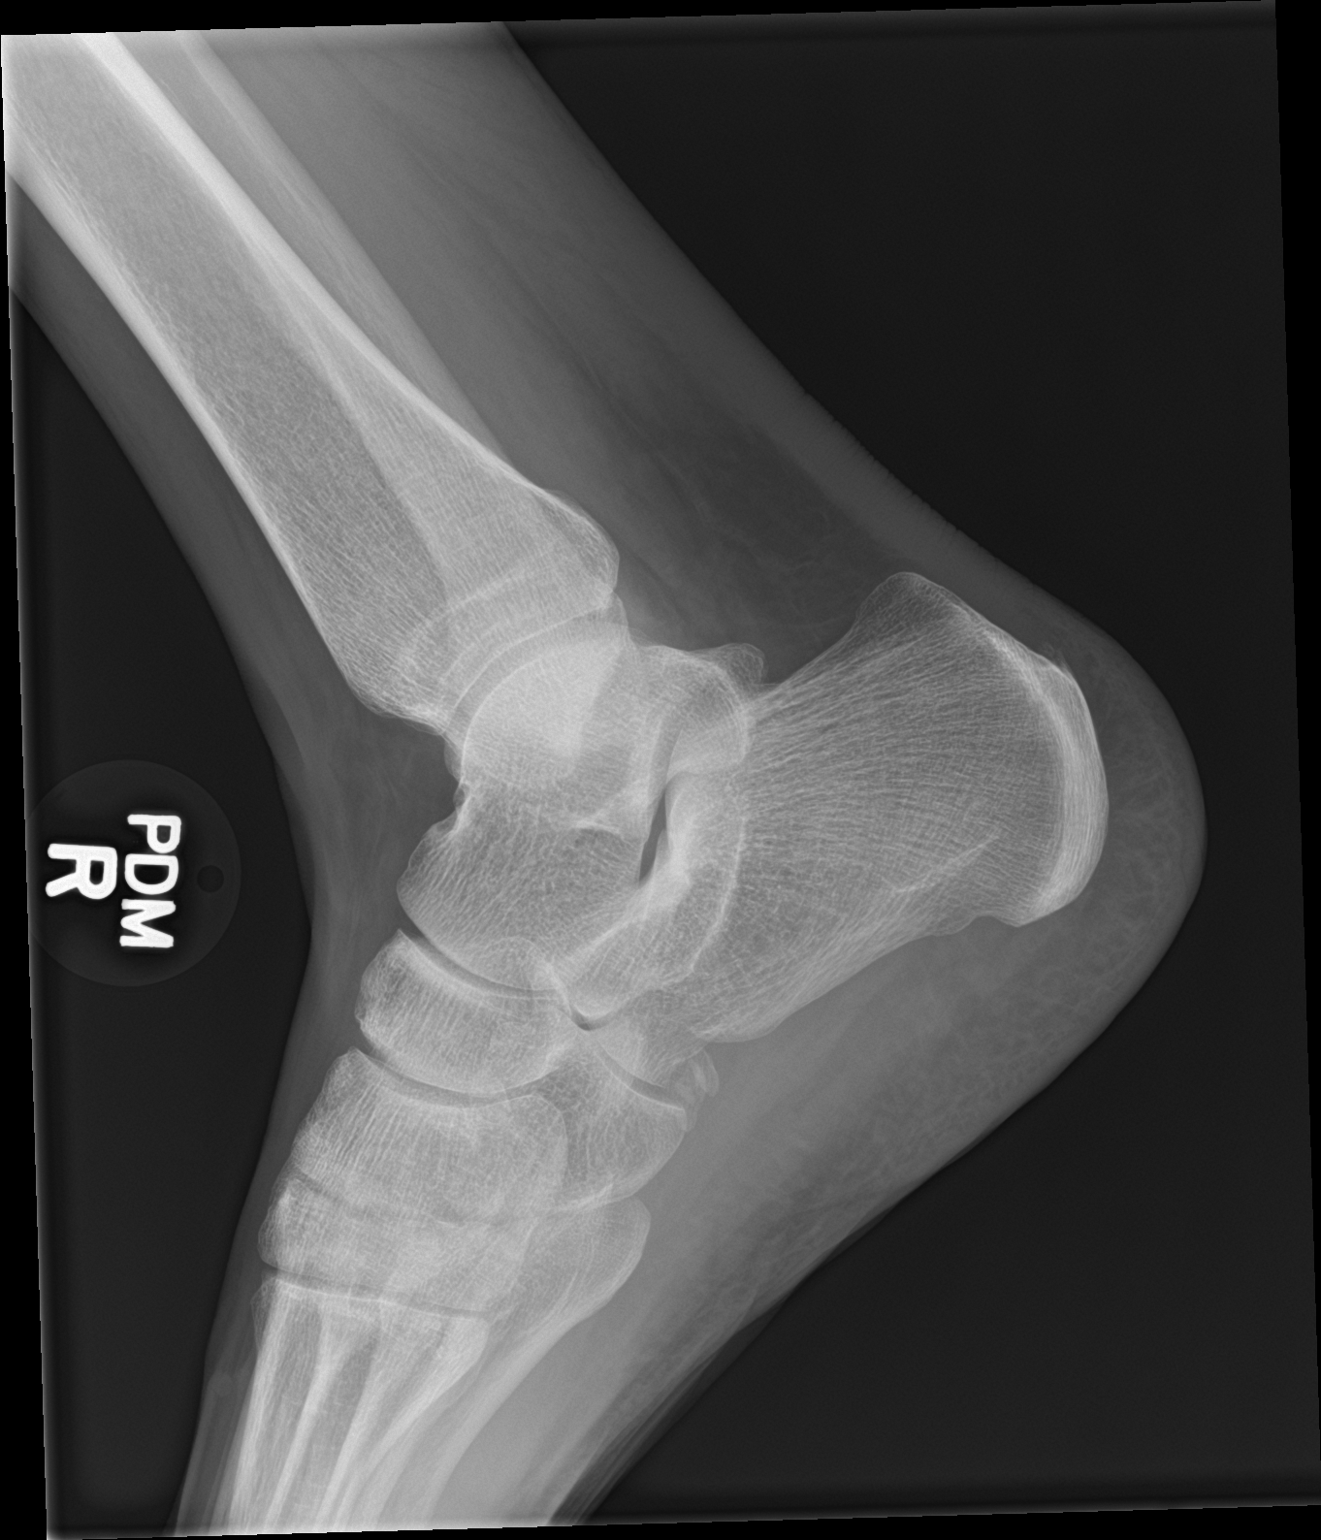

[3 of 3 positions shown; findings below may reference images not displayed]

FINDINGS: Frontal, oblique, and lateral views were obtained. There is no
evident fracture or joint effusion. The ankle mortise appears
intact. There is no appreciable joint space narrowing or erosion.
IMPRESSION: No fracture or appreciable arthropathy. Ankle mortise appears
intact.

## 2018-08-18 ENCOUNTER — Ambulatory Visit: Payer: 59 | Admitting: Internal Medicine

## 2018-09-01 ENCOUNTER — Ambulatory Visit: Payer: 59 | Admitting: Internal Medicine

## 2018-09-13 ENCOUNTER — Ambulatory Visit: Payer: Self-pay | Admitting: *Deleted

## 2018-09-13 ENCOUNTER — Encounter: Payer: Self-pay | Admitting: Internal Medicine

## 2018-09-13 ENCOUNTER — Ambulatory Visit (INDEPENDENT_AMBULATORY_CARE_PROVIDER_SITE_OTHER): Payer: 59 | Admitting: Internal Medicine

## 2018-09-13 DIAGNOSIS — J309 Allergic rhinitis, unspecified: Secondary | ICD-10-CM | POA: Insufficient documentation

## 2018-09-13 DIAGNOSIS — J019 Acute sinusitis, unspecified: Secondary | ICD-10-CM | POA: Insufficient documentation

## 2018-09-13 DIAGNOSIS — E785 Hyperlipidemia, unspecified: Secondary | ICD-10-CM | POA: Diagnosis not present

## 2018-09-13 MED ORDER — AMOXICILLIN-POT CLAVULANATE 875-125 MG PO TABS: 1.0000 | ORAL_TABLET | Freq: Two times a day (BID) | ORAL | 0 refills | Status: DC

## 2018-09-13 MED ORDER — PREDNISONE 10 MG PO TABS: ORAL_TABLET | ORAL | 0 refills | Status: DC

## 2018-09-13 NOTE — Progress Notes (Signed)
Patient ID: Randy Moran, male   DOB: [birth year], 58 y.o.   MRN: [id number]  Virtual Visit via Video Note  I connected with Randy Moran on 09/13/18 at  1:00 PM EDT by a video enabled telemedicine application and verified that I am speaking with the correct person using two identifiers. Pt is at home, and I am in office, and his wife is present as well   I discussed the limitations of evaluation and management by telemedicine and the availability of in person appointments. The patient expressed understanding and agreed to proceed.  History of Present Illness:  Here with 2-3 days acute onset fever, facial pain, pressure, headache, general weakness and malaise, and greenish d/c, with mild ST and cough, but pt denies chest pain, wheezing, increased sob or doe, orthopnea, PND, increased LE swelling, palpitations, dizziness or syncope.  Pt denies new neurological symptoms such as new headache, or facial or extremity weakness or numbness   Pt denies polydipsia, polyuria.  Will need note to return to work stating his symptoms are not consistent with [phone].  Does have several wks ongoing nasal allergy symptoms with clearish congestion, itch and sneezing, without fever, pain, ST, cough, swelling or wheezing.   Also states having some myalgias with current daily statin but has been somewhat improved with taking half daily, but getting tired of cutting pills Past Medical History:  Diagnosis Date  . Allergy   . Anxiety   . ANXIETY 03/22/2007   Qualifier: Diagnosis of  By: Jenny Reichmann MD, Hunt Oris   . Asthma    as a child  . DJD (degenerative joint disease)   . GERD (gastroesophageal reflux disease)    in the past  . Glaucoma left eye  . Hx of colonic polyps   . Hyperlipidemia   . Insomnia   . Lower back pain   . Migraines    treated by [facility]  . Myofascial pain syndrome   . Prostatitis   . Psoriatic arthritis ([place]) 03/04/2015  . Restless leg syndrome 08/25/2012   Past Surgical History:   Procedure Laterality Date  . ABDOMINAL EXPOSURE N/A 07/01/2017   Procedure: ABDOMINAL EXPOSURE;  Surgeon: Rosetta Posner, MD;  Location: [facility];  Service: Vascular;  Laterality: N/A;  . ANTERIOR LUMBAR FUSION N/A 07/01/2017   Procedure: Lumbar Five-Sacral one Anterior lumbar interbody fusion;  Surgeon: Kristeen Miss, MD;  Location: [place];  Service: Neurosurgery;  Laterality: N/A;  . APPENDECTOMY  1967  . bilateral inguinal herniorrhaphy     1960's  . bilateral shoulder surgery  2006, 2007,2010  . COLONOSCOPY    . ganglion cyst surgery  2009  . HERNIA REPAIR  09/30/11   bilateral inguinal hernia  . knee surgery x 2  2004,2008  . LUMBAR SPINE SURGERY  07/01/2017   Lumbar Five-Sacral one Anterior lumbar interbody fusion (N/A Spine Lumbar)  . UPPER GASTROINTESTINAL ENDOSCOPY      reports that he has never smoked. He has never used smokeless tobacco. He reports that he does not drink alcohol or use drugs. family history includes COPD in his father; Cancer in his maternal aunt and maternal uncle; Colon cancer in his maternal uncle and mother; Colon polyps in his sister; Coronary artery disease in his unknown relative; Hyperlipidemia in his unknown relative; Hypertension in his unknown relative. Allergies  Allergen Reactions  . Statins Other (See Comments)    Fatigue and weakness  . Sulfamethoxazole-Trimethoprim     REACTION: rash  . Levofloxacin Rash  Current Outpatient Medications on File Prior to Visit  Medication Sig Dispense Refill  . acetaminophen (TYLENOL) 500 MG tablet Take 500-1,000 mg by mouth every 6 (six) hours as needed for mild pain, moderate pain or headache.     . busPIRone (BUSPAR) 15 MG tablet TAKE 1 TABLET 3 TIMES A DAY AS NEEDED. 90 tablet 1  . EMGALITY 120 MG/ML SOAJ Inject 120 mg as directed every 30 (thirty) days.     Marland Kitchen escitalopram (LEXAPRO) 10 MG tablet TAKE 1 TABLET BY MOUTH DAILY. 30 tablet 0  . HUMIRA PEN 40 MG/0.8ML PNKT Inject 40 mg into the muscle every 14  (fourteen) days.     Marland Kitchen ibuprofen (ADVIL,MOTRIN) 200 MG tablet Take 400 mg by mouth every 8 (eight) hours as needed for mild pain or moderate pain.     Marland Kitchen latanoprost (XALATAN) 0.005 % ophthalmic solution Place 1 drop into both eyes at bedtime.     . Multiple Vitamins-Minerals (MULTIVITAMIN WITH MINERALS) tablet Take 1 tablet by mouth daily.    . naproxen sodium (ALEVE) 220 MG tablet Take 220 mg by mouth.    . rosuvastatin (CRESTOR) 20 MG tablet Take 1 tablet (20 mg total) by mouth daily. 90 tablet 3  . valACYclovir (VALTREX) 1000 MG tablet TAKE 1 TABLET BY MOUTH TWICE A DAY 20 tablet 0  . zolpidem (AMBIEN) 10 MG tablet Take 1 tablet (10 mg total) by mouth at bedtime as needed for up to 30 days for sleep. 90 tablet 1  . [DISCONTINUED] chlorproMAZINE (THORAZINE) 10 MG tablet As needed for migraine headache      No current facility-administered medications on file prior to visit.    Observations/Objective: Alert but fatigued, mild ill but non toxic, pt has facial flushing over maxillary sinuses with visible swelling on the right, and tender bilat  Lab Results  Component Value Date   WBC 5.2 04/10/2018   HGB 13.9 04/10/2018   HCT 41.7 04/10/2018   PLT 184 04/10/2018   GLUCOSE 101 (H) 04/10/2018   CHOL 265 (H) 03/06/2018   TRIG 89.0 03/06/2018   HDL 64.80 03/06/2018   LDLDIRECT 135.3 08/24/2012   LDLCALC 183 (H) 03/06/2018   ALT 29 03/06/2018   AST 23 03/06/2018   NA 142 04/10/2018   K 3.9 04/10/2018   CL 106 04/10/2018   CREATININE 0.98 04/10/2018   BUN 8 04/10/2018   CO2 28 04/10/2018   TSH 1.35 03/06/2018   PSA 1.13 03/06/2018   Assessment and Plan: Acute sinusitis - Mild to mod, for antibx course,  to f/u any worsening symptoms or concerns  Allergy - for predpac asd for seasonal flare,  to f/u any worsening symptoms or concerns  HLD - I asked pt to take the statin qod and will plan on f/u lipids at next visit  Follow Up Instructions: Please take all new medication as  prescribed - the antibiotic and prednisone  Please continue all other medications as before, and refills have been done if requested.  Please have the pharmacy call with any other refills you may need.  Please continue your efforts at being more active, low cholesterol diet, and weight control.  Please keep your appointments with your specialists as you may have planned  I discussed the assessment and treatment plan with the patient. The patient was provided an opportunity to ask questions and all were answered. The patient agreed with the plan and demonstrated an understanding of the instructions.   The patient was advised to call  back or seek an in-person evaluation if the symptoms worsen or if the condition fails to improve as anticipated.   Cathlean Cower, MD

## 2018-09-13 NOTE — Patient Instructions (Signed)
See above

## 2018-09-13 NOTE — Assessment & Plan Note (Signed)
See above

## 2018-09-13 NOTE — Telephone Encounter (Signed)
Virtual Visit has been set up for today.

## 2018-09-13 NOTE — Telephone Encounter (Signed)
Patient having headaches daily, facial pressure around the eyes. Nasal congestion with yellow/greenish discharge daily for approximately one month but seems to be worsening over the last week. Pain and tenderness behind his right ear that runs down his neck with stiffness. Taking aleve but is not helping.Sore throat only from drainage. Experiencing lightheadedness with walking lately, he feels is from the congestion. Has nausea with the lightheadedness no vomiting. Temperature in the 99.0s. Using netti pot, sudafed OTC.  Routing to PCP for possible appointment/advice. Prefers telephone visit. [employer] on file.  Reason for Disposition . [1] Sinus congestion (pressure, fullness) AND [2] present > 10 days  Answer Assessment - Initial Assessment Questions 1. LOCATION: "Where does it hurt?"     Behind right ear 2. ONSET: "When did the sinus pain start?"  (e.g., hours, days)     About a month ago but worsened over the last week.  3. SEVERITY: "How bad is the pain?"   (Scale 1-10; mild, moderate or severe)   - MILD (1-3): doesn't interfere with normal activities    - MODERATE (4-7): interferes with normal activities (e.g., work or school) or awakens from sleep   - SEVERE (8-10): excruciating pain and patient unable to do any normal activities        Pain includes right side of neck stiffness 4. RECURRENT SYMPTOM: "Have you ever had sinus problems before?" If so, ask: "When was the last time?" and "What happened that time?"      no 5. NASAL CONGESTION: "Is the nose blocked?" If so, ask, "Can you open it or must you breathe through the mouth?"    Yes blocked 6. NASAL DISCHARGE: "Do you have discharge from your nose?" If so ask, "What color?"     See note 7. FEVER: "Do you have a fever?" If so, ask: "What is it, how was it measured, and when did it start?"     See note 8. OTHER SYMPTOMS: "Do you have any other symptoms?" (e.g., sore throat, cough, earache, difficulty breathing)     See  note. No difficulty breathing or chest tightness 9. PREGNANCY: "Is there any chance you are pregnant?" "When was your last menstrual period?"     na  Protocols used: SINUS PAIN OR CONGESTION-A-AH

## 2018-09-13 NOTE — Assessment & Plan Note (Signed)
Mild to mod, for antibx course,  to f/u any worsening symptoms or concerns 

## 2018-09-15 ENCOUNTER — Telehealth: Payer: Self-pay | Admitting: Internal Medicine

## 2018-09-15 MED ORDER — CEFDINIR 300 MG PO CAPS: 300.0000 mg | ORAL_CAPSULE | Freq: Two times a day (BID) | ORAL | 0 refills | Status: DC

## 2018-09-15 NOTE — Telephone Encounter (Signed)
Ok to stop the augmentin due to diarrhea  Please take all new medication as prescribed - the omnicef  - done erx

## 2018-09-15 NOTE — Telephone Encounter (Signed)
Called pt, LVM with details below  

## 2018-09-15 NOTE — Telephone Encounter (Signed)
Copied from [place] [id number]. Topic: General - Other >> Sep 15, 2018 10:02 AM Percell Belt A wrote: Reason for CRM: pt wife called in and stated that Dr Jenny Reichmann put pt on Antibiotic and he was not able to go to work today due the diarrhea that it is causing.  She would like to know if there is a different one he can be put on or what should he do?    Best number [phone] Maudie Mercury

## 2018-09-15 NOTE — Addendum Note (Signed)
Addended by: Biagio Borg on: 09/15/2018 11:36 AM   Modules accepted: Orders

## 2018-09-21 ENCOUNTER — Other Ambulatory Visit: Payer: Self-pay | Admitting: Internal Medicine

## 2018-09-21 NOTE — Telephone Encounter (Signed)
Done erx 

## 2018-10-05 DIAGNOSIS — L4050 Arthropathic psoriasis, unspecified: Secondary | ICD-10-CM | POA: Diagnosis not present

## 2018-10-13 ENCOUNTER — Other Ambulatory Visit: Payer: Self-pay | Admitting: Internal Medicine

## 2018-10-17 ENCOUNTER — Ambulatory Visit (INDEPENDENT_AMBULATORY_CARE_PROVIDER_SITE_OTHER): Payer: 59 | Admitting: Internal Medicine

## 2018-10-17 ENCOUNTER — Encounter: Payer: Self-pay | Admitting: Internal Medicine

## 2018-10-17 DIAGNOSIS — R41840 Attention and concentration deficit: Secondary | ICD-10-CM | POA: Diagnosis not present

## 2018-10-17 DIAGNOSIS — E785 Hyperlipidemia, unspecified: Secondary | ICD-10-CM

## 2018-10-17 DIAGNOSIS — F419 Anxiety disorder, unspecified: Secondary | ICD-10-CM

## 2018-10-17 DIAGNOSIS — G4700 Insomnia, unspecified: Secondary | ICD-10-CM | POA: Diagnosis not present

## 2018-10-17 MED ORDER — ALPRAZOLAM 0.25 MG PO TABS: 0.2500 mg | ORAL_TABLET | Freq: Three times a day (TID) | ORAL | 2 refills | Status: DC | PRN

## 2018-10-17 MED ORDER — CITALOPRAM HYDROBROMIDE 20 MG PO TABS: 20.0000 mg | ORAL_TABLET | Freq: Every day | ORAL | 3 refills | Status: DC

## 2018-10-17 MED ORDER — AMPHETAMINE-DEXTROAMPHET ER 30 MG PO CP24: 30.0000 mg | ORAL_CAPSULE | Freq: Every day | ORAL | 0 refills | Status: DC

## 2018-10-17 NOTE — Patient Instructions (Signed)
Please take all new medication as prescribed - the celexa 20 mg per day  Please take all new medication as prescribed - the low dose xanax as needed for nerves  Please take all new medication as prescribed - the adderall XR 30 mg in "your morning"  You will be contacted regarding the referral for: ADD clinic  Please continue all other medications as before, and refills have been done if requested.  Please have the pharmacy call with any other refills you may need.  Please keep your appointments with your specialists as you may have planned

## 2018-10-17 NOTE — Assessment & Plan Note (Signed)
Ok for celexa 20 qd, xanax 0.25 tid prn, consider psychiatry referral,  to f/u any worsening symptoms or concerns

## 2018-10-17 NOTE — Assessment & Plan Note (Signed)
To cont amben qhs prn,  to f/u any worsening symptoms or concerns

## 2018-10-17 NOTE — Assessment & Plan Note (Signed)
Ok to take the statin qod, cont low chol diet

## 2018-10-17 NOTE — Progress Notes (Signed)
Patient ID: Randy Moran, male   DOB: August 09, 1960, 58 y.o.   MRN: [id number]  Virtual Visit via Video Note  I connected with Randy Moran on 10/17/18 at  2:20 PM EDT by a video enabled telemedicine application and verified that I am speaking with the correct person using two identifiers.  Location: Patient: at home with wife present on camera next to him Provider: at office   I discussed the limitations of evaluation and management by telemedicine and the availability of in person appointments. The patient expressed understanding and agreed to proceed.  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  Wife laments no time or energy for the couple.  Only took 1 mo lexapro last yr as became sluggish which he could not tolerate.  Not taking the lorazepam as somewhat sedating and slowing as well even at the 0.5 g dosing.  Feels he has ADD for many years and has been able to compensate, but now stress, anxiety, and task completion at work with moderate worsening.  Has not had significant work performance issue yet, but fears he may make a mistake at some point.  Incidentally, also c/o myalgias with crestor 20 mg and asks for guidance on this. Past Medical History:  Diagnosis Date  . Allergy   . Anxiety   . ANXIETY 03/22/2007   Qualifier: Diagnosis of  By: Jenny Reichmann MD, Hunt Oris   . Asthma    as a child  . DJD (degenerative joint disease)   . GERD (gastroesophageal reflux disease)    in the past  . Glaucoma left eye  . Hx of colonic polyps   . Hyperlipidemia   . Insomnia   . Lower  back pain   . Migraines    treated by [facility]  . Myofascial pain syndrome   . Prostatitis   . Psoriatic arthritis ([place]) 03/04/2015  . Restless leg syndrome 08/25/2012   Past Surgical History:  Procedure Laterality Date  . ABDOMINAL EXPOSURE N/A 07/01/2017   Procedure: ABDOMINAL EXPOSURE;  Surgeon: Rosetta Posner, MD;  Location: [facility] OR;  Service: Vascular;  Laterality: N/A;  . ANTERIOR LUMBAR FUSION N/A 07/01/2017   Procedure: Lumbar Five-Sacral one Anterior lumbar interbody fusion;  Surgeon: Kristeen Miss, MD;  Location: [place];  Service: Neurosurgery;  Laterality: N/A;  . APPENDECTOMY  1967  . bilateral inguinal herniorrhaphy     1960's  . bilateral shoulder surgery  2006, 2007,2010  . COLONOSCOPY    . ganglion cyst surgery  2009  . HERNIA REPAIR  09/30/11   bilateral inguinal hernia  . knee surgery x 2  2004,2008  . LUMBAR SPINE SURGERY  07/01/2017   Lumbar Five-Sacral one Anterior lumbar interbody fusion (N/A Spine Lumbar)  . UPPER GASTROINTESTINAL ENDOSCOPY      reports that he has never smoked. He has never used smokeless tobacco. He reports that he does not drink alcohol or use drugs. family history includes COPD in his father; Cancer in his maternal aunt and maternal uncle; Colon cancer in his maternal uncle and mother; Colon polyps in his sister; Coronary artery disease  in his unknown relative; Hyperlipidemia in his unknown relative; Hypertension in his unknown relative. Allergies  Allergen Reactions  . Augmentin [Amoxicillin-Pot Clavulanate] Diarrhea  . Statins Other (See Comments)    Fatigue and weakness  . Sulfamethoxazole-Trimethoprim     REACTION: rash  . Levofloxacin Rash   Current Outpatient Medications on File Prior to Visit  Medication Sig Dispense Refill  . acetaminophen (TYLENOL) 500 MG tablet Take 500-1,000 mg by mouth every 6 (six) hours as needed for mild pain, moderate pain or headache.     Marland Kitchen EMGALITY 120 MG/ML SOAJ Inject 120 mg as directed every 30 (thirty)  days.     Marland Kitchen HUMIRA PEN 40 MG/0.8ML PNKT Inject 40 mg into the muscle every 14 (fourteen) days.     Marland Kitchen ibuprofen (ADVIL,MOTRIN) 200 MG tablet Take 400 mg by mouth every 8 (eight) hours as needed for mild pain or moderate pain.     Marland Kitchen latanoprost (XALATAN) 0.005 % ophthalmic solution Place 1 drop into both eyes at bedtime.     . Multiple Vitamins-Minerals (MULTIVITAMIN WITH MINERALS) tablet Take 1 tablet by mouth daily.    . naproxen sodium (ALEVE) 220 MG tablet Take 220 mg by mouth.    . rosuvastatin (CRESTOR) 20 MG tablet Take 1 tablet (20 mg total) by mouth daily. 90 tablet 3  . valACYclovir (VALTREX) 1000 MG tablet TAKE 1 TABLET BY MOUTH TWICE A DAY 20 tablet 0  . zolpidem (AMBIEN) 10 MG tablet Take 1 tablet (10 mg total) by mouth at bedtime as needed for up to 30 days for sleep. 90 tablet 1  . [DISCONTINUED] chlorproMAZINE (THORAZINE) 10 MG tablet As needed for migraine headache      No current facility-administered medications on file prior to visit.      Observations/Objective: Alert, NAD, 2+ nervous  mood and affect, resps normal, cn 2-12 intact, moves all 4s, no visible rash or swelling Lab Results  Component Value Date   WBC 5.2 04/10/2018   HGB 13.9 04/10/2018   HCT 41.7 04/10/2018   PLT 184 04/10/2018   GLUCOSE 101 (H) 04/10/2018   CHOL 265 (H) 03/06/2018   TRIG 89.0 03/06/2018   HDL 64.80 03/06/2018   LDLDIRECT 135.3 08/24/2012   LDLCALC 183 (H) 03/06/2018   ALT 29 03/06/2018   AST 23 03/06/2018   NA 142 04/10/2018   K 3.9 04/10/2018   CL 106 04/10/2018   CREATININE 0.98 04/10/2018   BUN 8 04/10/2018   CO2 28 04/10/2018   TSH 1.35 03/06/2018   PSA 1.13 03/06/2018   Assessment and Plan: See notes  Follow Up Instructions: See notes   I discussed the assessment and treatment plan with the patient. The patient was provided an opportunity to ask questions and all were answered. The patient agreed with the plan and demonstrated an understanding of the instructions.    The patient was advised to call back or seek an in-person evaluation if the symptoms worsen or if the condition fails to improve as anticipated.   Cathlean Cower, MD

## 2018-10-17 NOTE — Assessment & Plan Note (Addendum)
Likely ADD, for trial adderal xr 30 qd for 1 mo, and refer for formal diagnosis at ADD clinic of [place]  Note:  Total time for pt hx, exam, review of record with pt in the room, determination of diagnoses and plan for further eval and tx is > 40 min, with over 50% spent in coordination and counseling of patient including the differential dx, tx, further evaluation and other management of concentration defecit, HLD, anxiety, insomnia

## 2018-10-24 ENCOUNTER — Telehealth: Payer: Self-pay

## 2018-10-24 NOTE — Telephone Encounter (Signed)
Called pt's wife and left detailed msg for pt to contact them at [phone] or she can call me at [phone]

## 2018-10-24 NOTE — Telephone Encounter (Signed)
Copied from [place] [id number]. Topic: Referral - Status >> Oct 24, 2018  9:01 AM Valla Leaver wrote: Reason for CRM: Wife would like a call back b/c the # to [facility] is not working. Dr. Jenny Reichmann referred the patient there for ADD. Please advise.

## 2018-11-29 ENCOUNTER — Ambulatory Visit: Payer: 59 | Admitting: Psychology

## 2018-12-13 DIAGNOSIS — M1611 Unilateral primary osteoarthritis, right hip: Secondary | ICD-10-CM | POA: Insufficient documentation

## 2019-02-03 ENCOUNTER — Other Ambulatory Visit: Payer: Self-pay | Admitting: Internal Medicine

## 2019-02-05 ENCOUNTER — Telehealth: Payer: Self-pay

## 2019-02-05 NOTE — Telephone Encounter (Signed)
Copied from [place] [id number]. Topic: General - Other >> Feb 05, 2019 11:36 AM Leward Quan A wrote: Reason for CRM: Patient wife Joelene Millin called to say that he can not take the rosuvastatin (CRESTOR) 20 MG tablet prescribed for because of the side effects that causes his legs to hurt badly and it interfere with his RLS. So she is asking can he be prescribed something with milder side effects please. Asking can this be done ASAP please because his cholesterol is extremely high. Also say that he was seen in the ED in October of 2019 for chest wall pains and he was having those pains again today. Please send Rx to [facility] [address] and call Joelene Millin to inform at Ph# [phone]

## 2019-02-06 MED ORDER — PRAVASTATIN SODIUM 40 MG PO TABS: 40.0000 mg | ORAL_TABLET | Freq: Every day | ORAL | 3 refills | Status: DC

## 2019-02-06 NOTE — Telephone Encounter (Signed)
Pt has been informed.

## 2019-02-06 NOTE — Telephone Encounter (Signed)
[place] for change to pravastatin 40 qd

## 2019-02-06 NOTE — Telephone Encounter (Signed)
Done erx 

## 2019-02-06 NOTE — Addendum Note (Signed)
Addended by: Biagio Borg on: 02/06/2019 06:49 AM   Modules accepted: Orders

## 2019-02-10 ENCOUNTER — Other Ambulatory Visit: Payer: Self-pay | Admitting: Internal Medicine

## 2019-02-13 ENCOUNTER — Ambulatory Visit (INDEPENDENT_AMBULATORY_CARE_PROVIDER_SITE_OTHER): Payer: 59 | Admitting: Internal Medicine

## 2019-02-13 DIAGNOSIS — K219 Gastro-esophageal reflux disease without esophagitis: Secondary | ICD-10-CM | POA: Diagnosis not present

## 2019-02-13 DIAGNOSIS — E785 Hyperlipidemia, unspecified: Secondary | ICD-10-CM

## 2019-02-13 DIAGNOSIS — R41840 Attention and concentration deficit: Secondary | ICD-10-CM

## 2019-02-13 DIAGNOSIS — F419 Anxiety disorder, unspecified: Secondary | ICD-10-CM

## 2019-02-13 DIAGNOSIS — E611 Iron deficiency: Secondary | ICD-10-CM

## 2019-02-13 DIAGNOSIS — E538 Deficiency of other specified B group vitamins: Secondary | ICD-10-CM

## 2019-02-13 DIAGNOSIS — Z0000 Encounter for general adult medical examination without abnormal findings: Secondary | ICD-10-CM

## 2019-02-13 DIAGNOSIS — E559 Vitamin D deficiency, unspecified: Secondary | ICD-10-CM

## 2019-02-13 MED ORDER — AMPHETAMINE-DEXTROAMPHETAMINE 10 MG PO TABS: ORAL_TABLET | ORAL | 0 refills | Status: DC

## 2019-02-13 MED ORDER — AMPHETAMINE-DEXTROAMPHET ER 20 MG PO CP24: 20.0000 mg | ORAL_CAPSULE | Freq: Every day | ORAL | 0 refills | Status: DC

## 2019-02-13 NOTE — Progress Notes (Signed)
Patient ID: Randy Moran, male   DOB: March 30, 1961, 58 y.o.   MRN: [id number]  Virtual Visit via Video Note  I connected with Randy Moran on 02/13/19 at  3:20 PM EDT by a video enabled telemedicine application and verified that I am speaking with the correct person using two identifiers.  Location: Patient: at home Provider: at office   I discussed the limitations of evaluation and management by telemedicine and the availability of in person appointments. The patient expressed understanding and agreed to proceed.  History of Present Illness: Here to f/u ADD, states adderall ER 30 seems to be too much with some "strong" feeling, hoping to reduce his dose as o/w does seem to help quite well at work with task completion, but seems to wane in effect late in the afternoon.  Pt denies chest pain, increased sob or doe, wheezing, orthopnea, PND, increased LE swelling, palpitations, dizziness or syncope.  Pt denies new neurological symptoms such as new headache, or facial or extremity weakness or numbness   Pt denies polydipsia, polyuria,  Pt denies fever, wt loss, night sweats, loss of appetite, or other constitutional symptoms  Denies worsening reflux, abd pain, dysphagia, n/v, bowel change or blood.  Denies worsening depressive symptoms, suicidal ideation, or panic Past Medical History:  Diagnosis Date  . Allergy   . Anxiety   . ANXIETY 03/22/2007   Qualifier: Diagnosis of  By: Jenny Reichmann MD, Hunt Oris   . Asthma    as a child  . DJD (degenerative joint disease)   . GERD (gastroesophageal reflux disease)    in the past  . Glaucoma left eye  . Hx of colonic polyps   . Hyperlipidemia   . Insomnia   . Lower back pain   . Migraines    treated by [facility]  . Myofascial pain syndrome   . Prostatitis   . Psoriatic arthritis ([place]) 03/04/2015  . Restless leg syndrome 08/25/2012   Past Surgical History:  Procedure Laterality Date  . ABDOMINAL EXPOSURE N/A 07/01/2017   Procedure: ABDOMINAL  EXPOSURE;  Surgeon: Rosetta Posner, MD;  Location: [facility] OR;  Service: Vascular;  Laterality: N/A;  . ANTERIOR LUMBAR FUSION N/A 07/01/2017   Procedure: Lumbar Five-Sacral one Anterior lumbar interbody fusion;  Surgeon: Kristeen Miss, MD;  Location: [place];  Service: Neurosurgery;  Laterality: N/A;  . APPENDECTOMY  1967  . bilateral inguinal herniorrhaphy     1960's  . bilateral shoulder surgery  2006, 2007,2010  . COLONOSCOPY    . ganglion cyst surgery  2009  . HERNIA REPAIR  09/30/11   bilateral inguinal hernia  . knee surgery x 2  2004,2008  . LUMBAR SPINE SURGERY  07/01/2017   Lumbar Five-Sacral one Anterior lumbar interbody fusion (N/A Spine Lumbar)  . UPPER GASTROINTESTINAL ENDOSCOPY      reports that he has never smoked. He has never used smokeless tobacco. He reports that he does not drink alcohol or use drugs. family history includes COPD in his father; Cancer in his maternal aunt and maternal uncle; Colon cancer in his maternal uncle and mother; Colon polyps in his sister; Coronary artery disease in his unknown relative; Hyperlipidemia in his unknown relative; Hypertension in his unknown relative. Allergies  Allergen Reactions  . Augmentin [Amoxicillin-Pot Clavulanate] Diarrhea  . Statins Other (See Comments)    Fatigue and weakness  . Sulfamethoxazole-Trimethoprim     REACTION: rash  . Levofloxacin Rash   Current Outpatient Medications on File Prior to Visit  Medication Sig Dispense Refill  . acetaminophen (TYLENOL) 500 MG tablet Take 500-1,000 mg by mouth every 6 (six) hours as needed for mild pain, moderate pain or headache.     . ALPRAZolam (XANAX) 0.25 MG tablet Take 1 tablet (0.25 mg total) by mouth 3 (three) times daily as needed for anxiety. 90 tablet 2  . citalopram (CELEXA) 20 MG tablet Take 1 tablet (20 mg total) by mouth daily. 90 tablet 3  . EMGALITY 120 MG/ML SOAJ Inject 120 mg as directed every 30 (thirty) days.     Marland Kitchen HUMIRA PEN 40 MG/0.8ML PNKT Inject 40 mg into  the muscle every 14 (fourteen) days.     Marland Kitchen ibuprofen (ADVIL,MOTRIN) 200 MG tablet Take 400 mg by mouth every 8 (eight) hours as needed for mild pain or moderate pain.     Marland Kitchen latanoprost (XALATAN) 0.005 % ophthalmic solution Place 1 drop into both eyes at bedtime.     . Multiple Vitamins-Minerals (MULTIVITAMIN WITH MINERALS) tablet Take 1 tablet by mouth daily.    . naproxen sodium (ALEVE) 220 MG tablet Take 220 mg by mouth.    . pravastatin (PRAVACHOL) 40 MG tablet Take 1 tablet (40 mg total) by mouth daily. 90 tablet 3  . valACYclovir (VALTREX) 1000 MG tablet TAKE 1 TABLET BY MOUTH TWICE A DAY 20 tablet 0  . zolpidem (AMBIEN) 10 MG tablet TAKE 1 TABLET BY MOUTH AT BEDTIME IF NEEDED FOR SLEEP. 90 tablet 1  . [DISCONTINUED] chlorproMAZINE (THORAZINE) 10 MG tablet As needed for migraine headache      No current facility-administered medications on file prior to visit.    Observations/Objective: Alert, NAD, appropriate mood and affect, resps normal, cn 2-12 intact, moves all 4s, no visible rash or swelling Lab Results  Component Value Date   WBC 5.2 04/10/2018   HGB 13.9 04/10/2018   HCT 41.7 04/10/2018   PLT 184 04/10/2018   GLUCOSE 101 (H) 04/10/2018   CHOL 265 (H) 03/06/2018   TRIG 89.0 03/06/2018   HDL 64.80 03/06/2018   LDLDIRECT 135.3 08/24/2012   LDLCALC 183 (H) 03/06/2018   ALT 29 03/06/2018   AST 23 03/06/2018   NA 142 04/10/2018   K 3.9 04/10/2018   CL 106 04/10/2018   CREATININE 0.98 04/10/2018   BUN 8 04/10/2018   CO2 28 04/10/2018   TSH 1.35 03/06/2018   PSA 1.13 03/06/2018   Assessment and Plan: See notes  Follow Up Instructions: See notes   I discussed the assessment and treatment plan with the patient. The patient was provided an opportunity to ask questions and all were answered. The patient agreed with the plan and demonstrated an understanding of the instructions.   The patient was advised to call back or seek an in-person evaluation if the symptoms worsen  or if the condition fails to improve as anticipated.  Cathlean Cower, MD

## 2019-02-13 NOTE — Patient Instructions (Addendum)
Ok to change the adderall ER 30 mg to adderal ER 20 mg in the AM, with a smaller short acting Adderall 10 mg in the afternoon  Please continue all other medications as before, and refills have been done if requested.  Please have the pharmacy call with any other refills you may need.  Please continue your efforts at being more active, low cholesterol diet, and weight control  Please keep your appointments with your specialists as you may have planned  Please return in 1 months, or sooner if needed

## 2019-02-14 ENCOUNTER — Encounter: Payer: Self-pay | Admitting: Internal Medicine

## 2019-02-14 NOTE — Assessment & Plan Note (Signed)
stable overall by history and exam, recent data reviewed with pt, and pt to continue medical treatment as before,  to f/u any worsening symptoms or concerns  

## 2019-02-14 NOTE — Assessment & Plan Note (Signed)
[place] for change in Adderall ER to 20 mg in AM, and adderal 10 prn in the afternoons,  to f/u any worsening symptoms or concerns

## 2019-02-28 ENCOUNTER — Ambulatory Visit: Payer: 59 | Admitting: Psychology

## 2019-03-14 ENCOUNTER — Ambulatory Visit: Payer: 59 | Admitting: Psychology

## 2019-03-21 ENCOUNTER — Other Ambulatory Visit: Payer: Self-pay | Admitting: Internal Medicine

## 2019-03-28 ENCOUNTER — Ambulatory Visit: Payer: 59 | Admitting: Psychology

## 2019-03-30 ENCOUNTER — Ambulatory Visit: Payer: 59 | Admitting: Family Medicine

## 2019-04-02 NOTE — Progress Notes (Signed)
[facility] [address], [place] [address] Phone: [phone] Subjective:   I [name] am serving as a Education administrator for Dr. Hulan Saas.  I'm seeing this patient by the request  of:    CC: Right-sided back pain  [id number]  Randy Moran is a 58 y.o. male coming in with complaint of right piriformis pain. Believes it is his piriformis. Drives back and forth to [place] about 4 times a week. Sit/stands for 12 hours. History of back pain and surgery. Was going to PT before covid. Was giving home exercises. Numbness and tingling to the toes with pain down the back of the leg. Achilles pain. History of torn meniscus in the right knee. Quad tightness and pain at times. Stretching makes the pain worse.   Onset- Chronic  Location - right piriformis  Duration-  Character- sore, TTP, steady sharp pain  Aggravating factors- pain at night, sitting  Reliving factors- Therapies tried- biofreeze, PT, heat, stretch, exercise, ice, Ibuprofen, aleve, Asprin  Severity-  8/10 at its worse   Past medical history significant for a single level fusion of the lumbar spine in 2019 patient at that point was having more of a left-sided radicular symptoms.     Past Medical History:  Diagnosis Date  . Allergy   . Anxiety   . ANXIETY 03/22/2007   Qualifier: Diagnosis of  By: Jenny Reichmann MD, Hunt Oris   . Asthma    as a child  . DJD (degenerative joint disease)   . GERD (gastroesophageal reflux disease)    in the past  . Glaucoma left eye  . Hx of colonic polyps   . Hyperlipidemia   . Insomnia   . Lower back pain   . Migraines    treated by [facility]  . Myofascial pain syndrome   . Prostatitis   . Psoriatic arthritis ([place]) 03/04/2015  . Restless leg syndrome 08/25/2012   Past Surgical History:  Procedure Laterality Date  . ABDOMINAL EXPOSURE N/A 07/01/2017   Procedure: ABDOMINAL EXPOSURE;  Surgeon: Rosetta Posner, MD;  Location: [facility] OR;  Service: Vascular;   Laterality: N/A;  . ANTERIOR LUMBAR FUSION N/A 07/01/2017   Procedure: Lumbar Five-Sacral one Anterior lumbar interbody fusion;  Surgeon: Kristeen Miss, MD;  Location: [place];  Service: Neurosurgery;  Laterality: N/A;  . APPENDECTOMY  1967  . bilateral inguinal herniorrhaphy     1960's  . bilateral shoulder surgery  2006, 2007,2010  . COLONOSCOPY    . ganglion cyst surgery  2009  . HERNIA REPAIR  09/30/11   bilateral inguinal hernia  . knee surgery x 2  2004,2008  . LUMBAR SPINE SURGERY  07/01/2017   Lumbar Five-Sacral one Anterior lumbar interbody fusion (N/A Spine Lumbar)  . UPPER GASTROINTESTINAL ENDOSCOPY     Social History   Socioeconomic History  . Marital status: Married    Spouse name: [place]  . Number of children: 1  . Years of education: 48  . Highest education level: Not on file  Occupational History  . Occupation: [employer] TECH    Employer: [employer]  Social Needs  . Financial resource strain: Not on file  . Food insecurity    Worry: Not on file    Inability: Not on file  . Transportation needs    Medical: Not on file    Non-medical: Not on file  Tobacco Use  . Smoking status: Never Smoker  . Smokeless tobacco: Never Used  Substance and  Sexual Activity  . Alcohol use: No  . Drug use: No  . Sexual activity: Not on file  Lifestyle  . Physical activity    Days per week: Not on file    Minutes per session: Not on file  . Stress: Not on file  Relationships  . Social Herbalist on phone: Not on file    Gets together: Not on file    Attends religious service: Not on file    Active member of club or organization: Not on file    Attends meetings of clubs or organizations: Not on file    Relationship status: Not on file  Other Topics Concern  . Not on file  Social History Narrative   Work or School: [employer] - on call every few weeks      Home Situation: lives with wife      Spiritual Beliefs: Darrick Meigs, [place]      Lifestyle: walks 6 days  per week; diet is fair      Caffeine- varies, some days I have none            Allergies  Allergen Reactions  . Augmentin [Amoxicillin-Pot Clavulanate] Diarrhea  . Statins Other (See Comments)    Fatigue and weakness  . Sulfamethoxazole-Trimethoprim     REACTION: rash  . Levofloxacin Rash   Family History  Problem Relation Age of Onset  . Colon cancer Mother   . COPD Father   . Coronary artery disease Unknown   . Hyperlipidemia Unknown   . Hypertension Unknown   . Cancer Maternal Aunt        breast  . Cancer Maternal Uncle        prostate  . Colon cancer Maternal Uncle   . Colon polyps Sister   . Esophageal cancer Neg Hx   . Rectal cancer Neg Hx   . Stomach cancer Neg Hx     Current Outpatient Medications (Endocrine & Metabolic):  .  predniSONE (DELTASONE) 50 MG tablet, Take 1 tablet (50 mg total) by mouth daily.    Current Outpatient Medications (Analgesics):  .  acetaminophen (TYLENOL) 500 MG tablet, Take 500-1,000 mg by mouth every 6 (six) hours as needed for mild pain, moderate pain or headache.  Marland Kitchen  HUMIRA PEN 40 MG/0.8ML PNKT, Inject 40 mg into the muscle every 14 (fourteen) days.  Marland Kitchen  ibuprofen (ADVIL,MOTRIN) 200 MG tablet, Take 400 mg by mouth every 8 (eight) hours as needed for mild pain or moderate pain.  .  naproxen sodium (ALEVE) 220 MG tablet, Take 220 mg by mouth.   Current Outpatient Medications (Other):  Marland Kitchen  ALPRAZolam (XANAX) 0.25 MG tablet, Take 1 tablet (0.25 mg total) by mouth 3 (three) times daily as needed for anxiety. Marland Kitchen  latanoprost (XALATAN) 0.005 % ophthalmic solution, Place 1 drop into both eyes at bedtime.  .  Multiple Vitamins-Minerals (MULTIVITAMIN WITH MINERALS) tablet, Take 1 tablet by mouth daily. .  valACYclovir (VALTREX) 1000 MG tablet, TAKE 1 TABLET BY MOUTH TWICE A DAY .  gabapentin (NEURONTIN) 100 MG capsule, Take 2 capsules (200 mg total) by mouth at bedtime.    Past medical history, social, surgical and family history all  reviewed in electronic medical record.  No pertanent information unless stated regarding to the chief complaint.   Review of Systems:  No headache, visual changes, nausea, vomiting, diarrhea, constipation, dizziness, abdominal pain, skin rash, fevers, chills, night sweats, weight loss, swollen lymph nodes, body aches, joint swelling,  chest pain, shortness of breath, mood changes.  Positive muscle aches  Objective  Blood pressure 110/82, pulse 72, height 5\' 10"  (1.778 m), weight 161 lb (73 kg), SpO2 98 %.     General: No apparent distress alert and oriented x3 mood and affect normal, dressed appropriately.  HEENT: Pupils equal, extraocular movements intact  Respiratory: Patient's speak in full sentences and does not appear short of breath  Cardiovascular: No lower extremity edema, non tender, no erythema  Skin: Warm dry intact with no signs of infection or rash on extremities or on axial skeleton.  Abdomen: Soft nontender  Neuro: Cranial nerves II through XII are intact, neurovascularly intact in all extremities with 2+ DTRs and 2+ pulses.  Lymph: No lymphadenopathy of posterior or anterior cervical chain or axillae bilaterally.  Gait normal with good balance and coordination.  MSK:  Non tender with full range of motion and good stability and symmetric strength and tone of shoulders, elbows, wrist, hip, knee and ankles bilaterally.  Neck exam does have some mild loss of lordosis.  Patient is severely tender to palpation in the paraspinal musculature lumbar spine as well as over the right piriformis.  Positive Corky Sox but also positive straight leg test with radicular symptoms in the S1 distribution.  5 and 5 strength of the lower extremities and deep tendon reflexes are intact   Impression and Recommendations:     This case required medical decision making of moderate complexity. The above documentation has been reviewed and is accurate and complete Lyndal Pulley, DO       Note: This  dictation was prepared with Dragon dictation along with smaller phrase technology. Any transcriptional errors that result from this process are unintentional.

## 2019-04-03 ENCOUNTER — Ambulatory Visit (INDEPENDENT_AMBULATORY_CARE_PROVIDER_SITE_OTHER)
Admission: RE | Admit: 2019-04-03 | Discharge: 2019-04-03 | Disposition: A | Discharge status: Home / Self Care | Payer: 59 | Source: Ambulatory Visit | Attending: Family Medicine | Admitting: Family Medicine

## 2019-04-03 ENCOUNTER — Ambulatory Visit (INDEPENDENT_AMBULATORY_CARE_PROVIDER_SITE_OTHER): Payer: 59 | Admitting: Family Medicine

## 2019-04-03 ENCOUNTER — Other Ambulatory Visit: Payer: Self-pay

## 2019-04-03 ENCOUNTER — Encounter: Payer: Self-pay | Admitting: Family Medicine

## 2019-04-03 VITALS — BP 110/82 | HR 72 | Ht 70.0 in | Wt 161.0 lb

## 2019-04-03 DIAGNOSIS — M545 Low back pain, unspecified: Secondary | ICD-10-CM

## 2019-04-03 DIAGNOSIS — M48062 Spinal stenosis, lumbar region with neurogenic claudication: Secondary | ICD-10-CM | POA: Diagnosis not present

## 2019-04-03 DIAGNOSIS — G8929 Other chronic pain: Secondary | ICD-10-CM

## 2019-04-03 MED ORDER — GABAPENTIN 100 MG PO CAPS: 200.0000 mg | ORAL_CAPSULE | Freq: Every day | ORAL | 3 refills | Status: DC

## 2019-04-03 MED ORDER — PREDNISONE 50 MG PO TABS: 50.0000 mg | ORAL_TABLET | Freq: Every day | ORAL | 0 refills | Status: DC

## 2019-04-03 NOTE — Assessment & Plan Note (Signed)
History of spinal stenosis.  We will get repeat x-rays.  Concern for more of a nerve impingement that could be either from the lumbar radiculopathy versus the piriformis.  Patient is to increase activity as tolerated.  Discussed icing regimen and home exercises.  Patient will increase activity icing regimen.  Follow-up again in 4 to 8 weeks

## 2019-04-03 NOTE — Patient Instructions (Addendum)
Good to see you.  Ice 20 minutes 2 times daily. Usually after activity and before bed. Exercises 3 times a week.  prednsione daily for 5 days  Gabapentin 200 mg at night  Xray downstairs Tennis ball in back pocket See me again in 4 weeks if not better we will do injection in the piriformis

## 2019-04-05 ENCOUNTER — Other Ambulatory Visit: Payer: Self-pay

## 2019-04-05 ENCOUNTER — Ambulatory Visit (INDEPENDENT_AMBULATORY_CARE_PROVIDER_SITE_OTHER): Payer: 59

## 2019-04-05 DIAGNOSIS — Z23 Encounter for immunization: Secondary | ICD-10-CM | POA: Diagnosis not present

## 2019-04-13 ENCOUNTER — Ambulatory Visit: Payer: 59 | Admitting: Psychology

## 2019-05-01 ENCOUNTER — Ambulatory Visit: Payer: 59 | Admitting: Family Medicine

## 2019-05-01 ENCOUNTER — Encounter: Payer: 59 | Admitting: Internal Medicine

## 2019-05-02 ENCOUNTER — Encounter: Payer: 59 | Admitting: Internal Medicine

## 2019-05-16 ENCOUNTER — Ambulatory Visit: Payer: 59 | Admitting: Family Medicine

## 2019-05-16 NOTE — Progress Notes (Deleted)
[facility] Randy [place], [place] [address] Phone: [phone] Subjective:    I'm seeing this patient by the request  of:    This visit occurred during the SARS-CoV-2 public health emergency.  Safety protocols were in place, including screening questions prior to the visit, additional usage of staff PPE, and extensive cleaning of exam room while observing appropriate contact time as indicated for disinfecting solutions.     CC:   [id number]  Randy Moran is a 58 y.o. male coming in with complaint of ***  Onset-  Location Duration-  Character- Aggravating factors- Reliving factors-  Therapies tried-  Severity-     Past Medical History:  Diagnosis Date  . Allergy   . Anxiety   . ANXIETY 03/22/2007   Qualifier: Diagnosis of  By: Jenny Reichmann MD, Hunt Oris   . Asthma    as a child  . DJD (degenerative joint disease)   . GERD (gastroesophageal reflux disease)    in the past  . Glaucoma left eye  . Hx of colonic polyps   . Hyperlipidemia   . Insomnia   . Lower back pain   . Migraines    treated by [facility]  . Myofascial pain syndrome   . Prostatitis   . Psoriatic arthritis ([place]) 03/04/2015  . Restless leg syndrome 08/25/2012   Past Surgical History:  Procedure Laterality Date  . ABDOMINAL EXPOSURE N/A 07/01/2017   Procedure: ABDOMINAL EXPOSURE;  Surgeon: Rosetta Posner, MD;  Location[facility] OR;  Service: Vascular;  Laterality: N/A;  . ANTERIOR LUMBAR FUSION N/A 07/01/2017   Procedure: Lumbar Five-Sacral one Anterior lumbar interbody fusion;  Surgeon: Kristeen Miss, MD;  Location: [place];  Service: Neurosurgery;  Laterality: N/A;  . APPENDECTOMY  1967  . bilateral inguinal herniorrhaphy     1960's  . bilateral shoulder surgery  2006, 2007,2010  . COLONOSCOPY    . ganglion cyst surgery  2009  . HERNIA REPAIR  09/30/11   bilateral inguinal hernia  . knee surgery x 2  2004,2008  . LUMBAR SPINE SURGERY  07/01/2017   Lumbar Five-Sacral  one Anterior lumbar interbody fusion (N/A Spine Lumbar)  . UPPER GASTROINTESTINAL ENDOSCOPY     Social History   Socioeconomic History  . Marital status: Married    Spouse name: [place]  . Number of children: 1  . Years of education: 36  . Highest education level: Not on file  Occupational History  . Occupation: [employer] TECH    Employer: [employer]  Social Needs  . Financial resource strain: Not on file  . Food insecurity    Worry: Not on file    Inability: Not on file  . Transportation needs    Medical: Not on file    Non-medical: Not on file  Tobacco Use  . Smoking status: Never Smoker  . Smokeless tobacco: Never Used  Substance and Sexual Activity  . Alcohol use: No  . Drug use: No  . Sexual activity: Not on file  Lifestyle  . Physical activity    Days per week: Not on file    Minutes per session: Not on file  . Stress: Not on file  Relationships  . Social Herbalist on phone: Not on file    Gets together: Not on file    Attends religious service: Not on file    Active member of club or organization: Not on file    Attends meetings of  clubs or organizations: Not on file    Relationship status: Not on file  Other Topics Concern  . Not on file  Social History Narrative   Work or School: [employer] - on call every few weeks      Home Situation: lives with wife      Spiritual Beliefs: Darrick Meigs, [place]      Lifestyle: walks 6 days per week; diet is fair      Caffeine- varies, some days I have none            Allergies  Allergen Reactions  . Augmentin [Amoxicillin-Pot Clavulanate] Diarrhea  . Statins Other (See Comments)    Fatigue and weakness  . Sulfamethoxazole-Trimethoprim     REACTION: rash  . Levofloxacin Rash   Family History  Problem Relation Age of Onset  . Colon cancer Mother   . COPD Father   . Coronary artery disease Unknown   . Hyperlipidemia Unknown   . Hypertension Unknown   . Cancer Maternal Aunt        breast  .  Cancer Maternal Uncle        prostate  . Colon cancer Maternal Uncle   . Colon polyps Sister   . Esophageal cancer Neg Hx   . Rectal cancer Neg Hx   . Stomach cancer Neg Hx     Current Outpatient Medications (Endocrine & Metabolic):  .  predniSONE (DELTASONE) 50 MG tablet, Take 1 tablet (50 mg total) by mouth daily.    Current Outpatient Medications (Analgesics):  .  acetaminophen (TYLENOL) 500 MG tablet, Take 500-1,000 mg by mouth every 6 (six) hours as needed for mild pain, moderate pain or headache.  Marland Kitchen  HUMIRA PEN 40 MG/0.8ML PNKT, Inject 40 mg into the muscle every 14 (fourteen) days.  Marland Kitchen  ibuprofen (ADVIL,MOTRIN) 200 MG tablet, Take 400 mg by mouth every 8 (eight) hours as needed for mild pain or moderate pain.  .  naproxen sodium (ALEVE) 220 MG tablet, Take 220 mg by mouth.   Current Outpatient Medications (Other):  Marland Kitchen  ALPRAZolam (XANAX) 0.25 MG tablet, Take 1 tablet (0.25 mg total) by mouth 3 (three) times daily as needed for anxiety. .  gabapentin (NEURONTIN) 100 MG capsule, Take 2 capsules (200 mg total) by mouth at bedtime. Marland Kitchen  latanoprost (XALATAN) 0.005 % ophthalmic solution, Place 1 drop into both eyes at bedtime.  .  Multiple Vitamins-Minerals (MULTIVITAMIN WITH MINERALS) tablet, Take 1 tablet by mouth daily. .  valACYclovir (VALTREX) 1000 MG tablet, TAKE 1 TABLET BY MOUTH TWICE A DAY    Past medical history, social, surgical and family history all reviewed in electronic medical record.  No pertanent information unless stated regarding to the chief complaint.   Review of Systems:  No headache, visual changes, nausea, vomiting, diarrhea, constipation, dizziness, abdominal pain, skin rash, fevers, chills, night sweats, weight loss, swollen lymph nodes, body aches, joint swelling, muscle aches, chest pain, shortness of breath, mood changes.   Objective  There were no vitals taken for this visit. Systems examined below as of    General: No apparent distress alert and  oriented x3 mood and affect normal, dressed appropriately.  HEENT: Pupils equal, extraocular movements intact  Respiratory: Patient's speak in full sentences and does not appear short of breath  Cardiovascular: No lower extremity edema, non tender, no erythema  Skin: Warm dry intact with no signs of infection or rash on extremities or on axial skeleton.  Abdomen: Soft nontender  Neuro: Cranial nerves  II through XII are intact, neurovascularly intact in all extremities with 2+ DTRs and 2+ pulses.  Lymph: No lymphadenopathy of posterior or anterior cervical chain or axillae bilaterally.  Gait normal with good balance and coordination.  MSK:  Non tender with full range of motion and good stability and symmetric strength and tone of shoulders, elbows, wrist, hip, knee and ankles bilaterally.     Impression and Recommendations:     This case required medical decision making of moderate complexity. The above documentation has been reviewed and is accurate and complete Lyndal Pulley, DO       Note: This dictation was prepared with Dragon dictation along with smaller phrase technology. Any transcriptional errors that result from this process are unintentional.

## 2019-05-19 IMAGING — CR DG OR LOCAL ABDOMEN
1 series · 1 of 1 positions shown · non-contrast
Comparison: None

CLINICAL DATA: Anterior lumbar fusion.

EXAM:
OR LOCAL ABDOMEN

[AP]
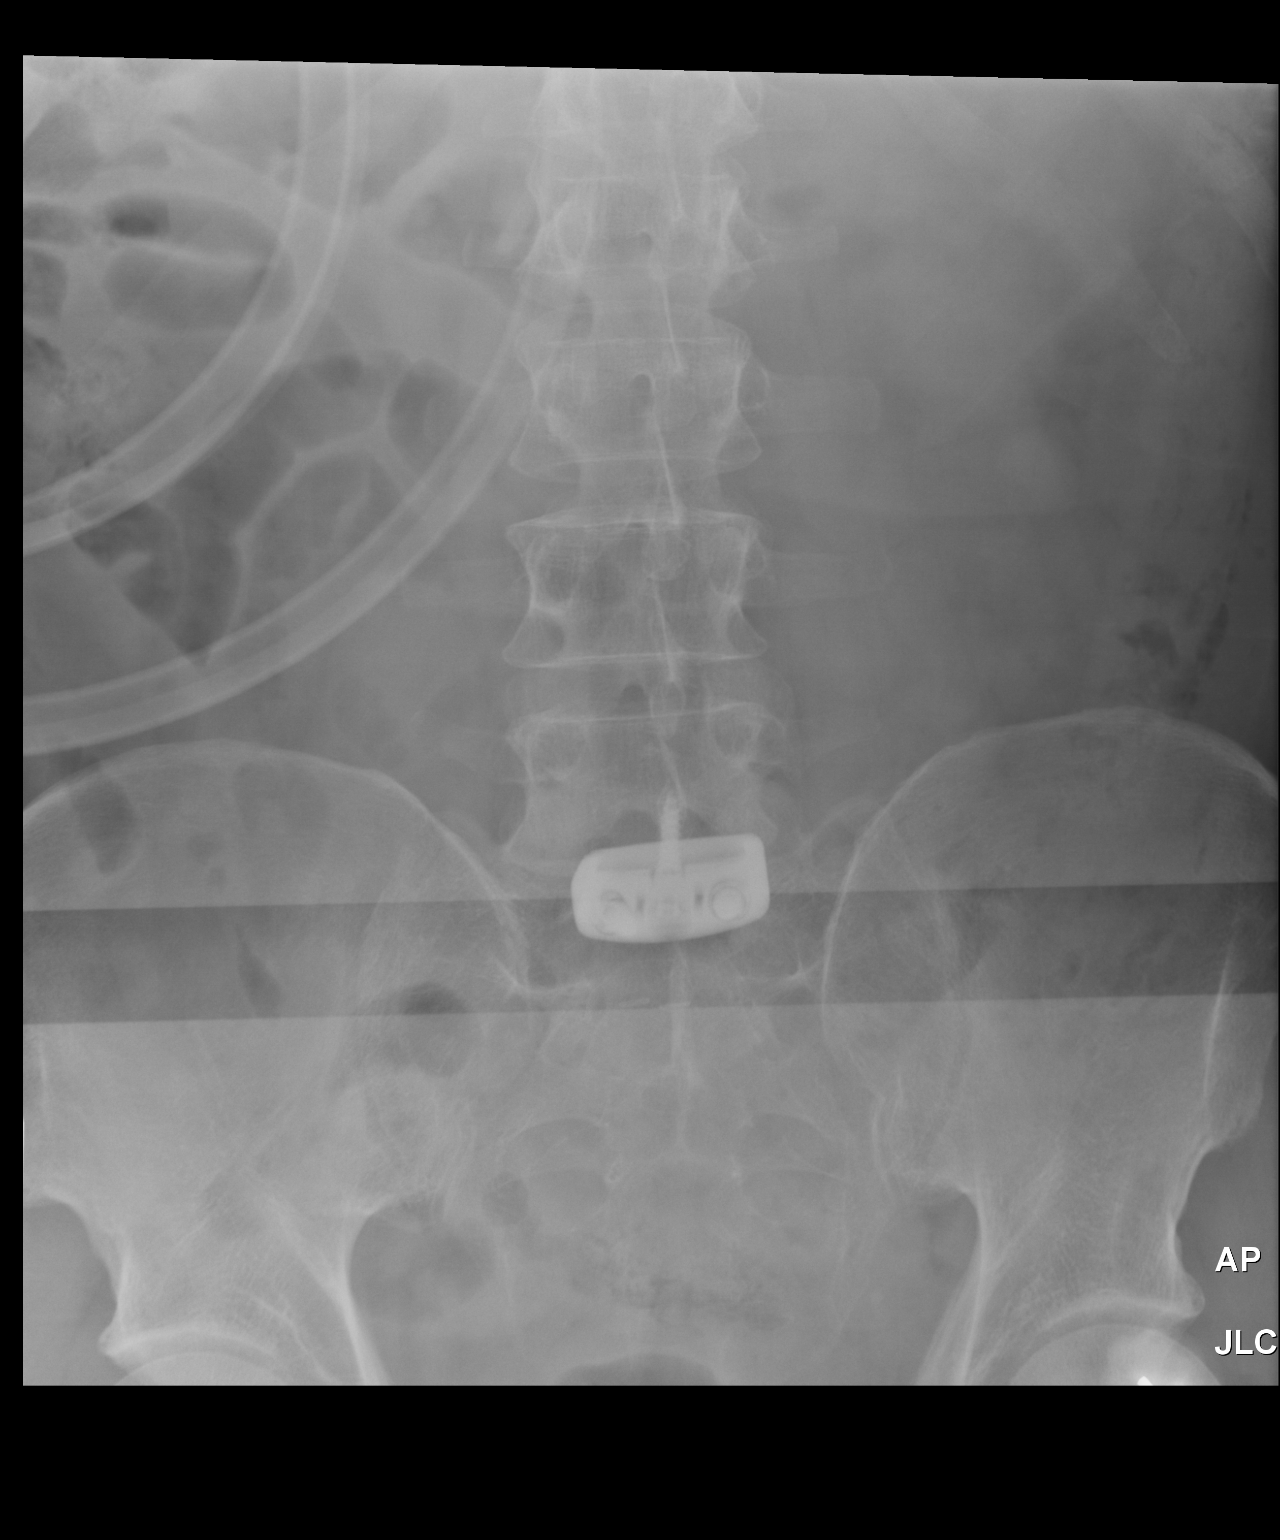

[1 of 1 positions shown; findings below may reference images not displayed]

FINDINGS: No retained instruments. Evidence of anterior lumbar fusion
performed at L5-S1.

Bowel gas pattern is normal.
IMPRESSION: No retained surgical instruments.

## 2019-05-19 IMAGING — RF DG C-ARM 61-120 MIN
1 series · 2 of 2 positions shown · non-contrast
Comparison: 03/29/2017.

CLINICAL DATA: Lumbar spine fusion.

EXAM:
LUMBAR SPINE - 2-3 VIEW; DG C-ARM 61-120 MIN

[Series 1: run · 2 of 2 slices shown]
[im 1/2]
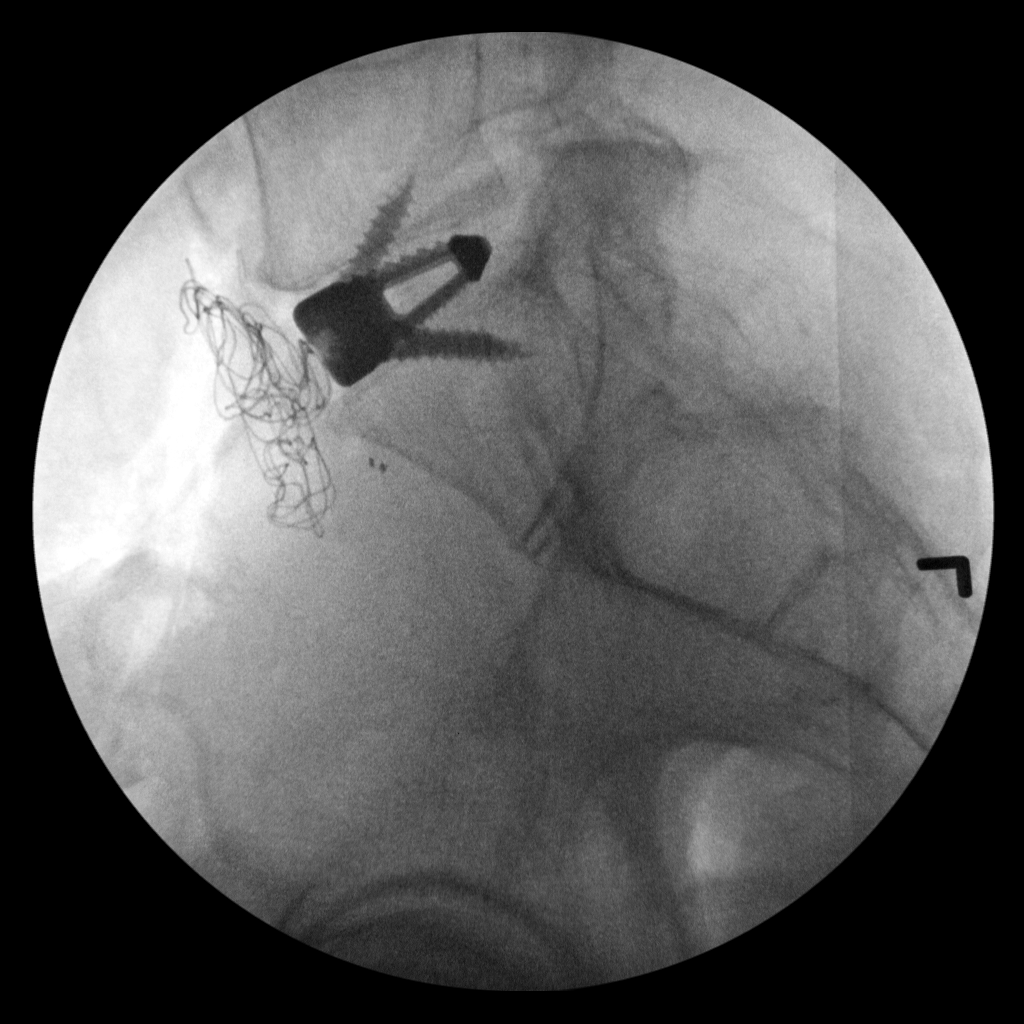
[im 2/2]
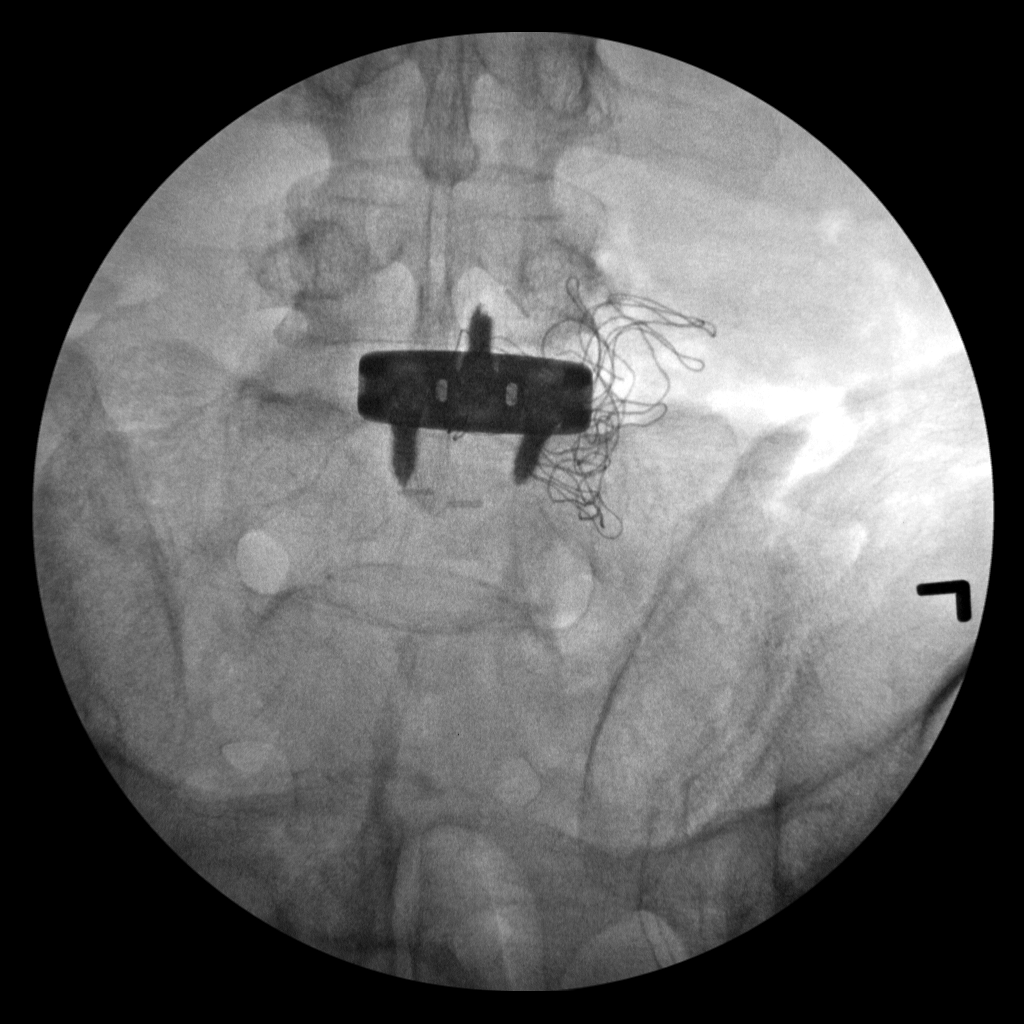

[2 of 2 positions shown; findings below may reference images not displayed]

FINDINGS: Lumbosacral anterior and interbody fusion. Hardware intact. Surgical
sponge noted anterior to the spine.
IMPRESSION: Postsurgical changes noted of the lumbar spine. Surgical noted
anterior to the spine.

Report phoned to the operating room nurse at the time of the
procedure.

## 2019-05-27 ENCOUNTER — Other Ambulatory Visit: Payer: Self-pay | Admitting: Internal Medicine

## 2019-05-31 ENCOUNTER — Ambulatory Visit: Payer: 59 | Attending: Internal Medicine

## 2019-05-31 DIAGNOSIS — Z20822 Contact with and (suspected) exposure to covid-19: Secondary | ICD-10-CM

## 2019-06-01 LAB — NOVEL CORONAVIRUS, NAA: SARS-CoV-2, NAA: NOT DETECTED

## 2019-07-02 ENCOUNTER — Other Ambulatory Visit: Payer: Self-pay | Admitting: Internal Medicine

## 2019-08-20 ENCOUNTER — Ambulatory Visit: Payer: 59 | Attending: Internal Medicine

## 2019-08-20 DIAGNOSIS — Z20822 Contact with and (suspected) exposure to covid-19: Secondary | ICD-10-CM

## 2019-08-21 LAB — NOVEL CORONAVIRUS, NAA: SARS-CoV-2, NAA: NOT DETECTED

## 2019-08-24 ENCOUNTER — Other Ambulatory Visit: Payer: Self-pay

## 2019-08-24 ENCOUNTER — Ambulatory Visit (INDEPENDENT_AMBULATORY_CARE_PROVIDER_SITE_OTHER): Payer: 59

## 2019-08-24 ENCOUNTER — Ambulatory Visit: Payer: 59 | Admitting: Internal Medicine

## 2019-08-24 ENCOUNTER — Encounter: Payer: Self-pay | Admitting: Internal Medicine

## 2019-08-24 ENCOUNTER — Ambulatory Visit (INDEPENDENT_AMBULATORY_CARE_PROVIDER_SITE_OTHER): Payer: 59 | Admitting: Internal Medicine

## 2019-08-24 DIAGNOSIS — Z0000 Encounter for general adult medical examination without abnormal findings: Secondary | ICD-10-CM

## 2019-08-24 DIAGNOSIS — M79606 Pain in leg, unspecified: Secondary | ICD-10-CM | POA: Insufficient documentation

## 2019-08-24 DIAGNOSIS — R079 Chest pain, unspecified: Secondary | ICD-10-CM | POA: Diagnosis not present

## 2019-08-24 DIAGNOSIS — R509 Fever, unspecified: Secondary | ICD-10-CM | POA: Insufficient documentation

## 2019-08-24 DIAGNOSIS — E559 Vitamin D deficiency, unspecified: Secondary | ICD-10-CM

## 2019-08-24 DIAGNOSIS — F419 Anxiety disorder, unspecified: Secondary | ICD-10-CM

## 2019-08-24 DIAGNOSIS — E538 Deficiency of other specified B group vitamins: Secondary | ICD-10-CM | POA: Diagnosis not present

## 2019-08-24 MED ORDER — HYDROCODONE-HOMATROPINE 5-1.5 MG/5ML PO SYRP: 5.0000 mL | ORAL_SOLUTION | Freq: Four times a day (QID) | ORAL | 0 refills | Status: AC | PRN

## 2019-08-24 MED ORDER — CEFTRIAXONE SODIUM 1 G IJ SOLR
1.0000 g | Freq: Once | INTRAMUSCULAR | Status: AC
  Administered 2019-08-24: 1 g via INTRAMUSCULAR

## 2019-08-24 MED ORDER — CEFDINIR 300 MG PO CAPS: 300.0000 mg | ORAL_CAPSULE | Freq: Two times a day (BID) | ORAL | 0 refills | Status: DC

## 2019-08-24 NOTE — Addendum Note (Signed)
Addended by: Cresenciano Lick on: 08/24/2019 04:34 PM   Modules accepted: Orders

## 2019-08-24 NOTE — Patient Instructions (Signed)
You had the antibiotic shot today (rocephin)  Please take all new medication as prescribed - the generic Omnicef antibiotic pills, and cough medicine as needed  Please continue all other medications as before, and refills have been done if requested.  Please have the pharmacy call with any other refills you may need.  Please keep your appointments with your specialists as you may have planned  Please go to the LAB at the blood drawing area for the tests to be done  Please go to the XRAY Department in the first floor for the x-ray testing  You will be contacted by phone if any changes need to be made immediately.  Otherwise, you will receive a letter about your results with an explanation, but please check with MyChart first.  Please remember to sign up for MyChart if you have not done so, as this will be important to you in the future with finding out test results, communicating by private email, and scheduling acute appointments online when needed.

## 2019-08-24 NOTE — Progress Notes (Signed)
Subjective:    Patient ID: Randy Moran, male    DOB: January 02, 1961, 59 y.o.   MRN: [id number]  HPI  Here with acute onset illness starting wed mar 10 with ache and feeling flu like per pt, severe fatigue, then thur with diffuse chest achy pains and cough, and today with URI symptoms of facial sweling, left ear pain and congestion; of note daughter and other family recently COVID + but had no overt direct exposure, and himself tested COVID neg on Mon mar 8, with results given mar 9.  Otherwise with achy pain all over mostely to face, back and neck.  Denies worsening reflux, abd pain, dysphagia, n/v, bowel change or blood.   Pt denies polydipsia, polyuria Past Medical History:  Diagnosis Date  . Allergy   . Anxiety   . ANXIETY 03/22/2007   Qualifier: Diagnosis of  By: Jenny Reichmann MD, Hunt Oris   . Asthma    as a child  . DJD (degenerative joint disease)   . GERD (gastroesophageal reflux disease)    in the past  . Glaucoma left eye  . Hx of colonic polyps   . Hyperlipidemia   . Insomnia   . Lower back pain   . Migraines    treated by [facility]  . Myofascial pain syndrome   . Prostatitis   . Psoriatic arthritis ([place]) 03/04/2015  . Restless leg syndrome 08/25/2012   Past Surgical History:  Procedure Laterality Date  . ABDOMINAL EXPOSURE N/A 07/01/2017   Procedure: ABDOMINAL EXPOSURE;  Surgeon: Rosetta Posner, MD;  Location: [facility] OR;  Service: Vascular;  Laterality: N/A;  . ANTERIOR LUMBAR FUSION N/A 07/01/2017   Procedure: Lumbar Five-Sacral one Anterior lumbar interbody fusion;  Surgeon: Kristeen Miss, MD;  Location: [place];  Service: Neurosurgery;  Laterality: N/A;  . APPENDECTOMY  1967  . bilateral inguinal herniorrhaphy     1960's  . bilateral shoulder surgery  2006, 2007,2010  . COLONOSCOPY    . ganglion cyst surgery  2009  . HERNIA REPAIR  09/30/11   bilateral inguinal hernia  . knee surgery x 2  2004,2008  . LUMBAR SPINE SURGERY  07/01/2017   Lumbar Five-Sacral one Anterior lumbar  interbody fusion (N/A Spine Lumbar)  . UPPER GASTROINTESTINAL ENDOSCOPY      reports that he has never smoked. He has never used smokeless tobacco. He reports that he does not drink alcohol or use drugs. family history includes COPD in his father; Cancer in his maternal aunt and maternal uncle; Colon cancer in his maternal uncle and mother; Colon polyps in his sister; Coronary artery disease in an other family member; Hyperlipidemia in an other family member; Hypertension in an other family member. Allergies  Allergen Reactions  . Augmentin [Amoxicillin-Pot Clavulanate] Diarrhea  . Statins Other (See Comments)    Fatigue and weakness  . Sulfamethoxazole-Trimethoprim     REACTION: rash  . Levofloxacin Rash   Current Outpatient Medications on File Prior to Visit  Medication Sig Dispense Refill  . acetaminophen (TYLENOL) 500 MG tablet Take 500-1,000 mg by mouth every 6 (six) hours as needed for mild pain, moderate pain or headache.     . busPIRone (BUSPAR) 15 MG tablet TAKE 1 TABLET 3 TIMES A DAY AS NEEDED. 90 tablet 0  . gabapentin (NEURONTIN) 100 MG capsule Take 2 capsules (200 mg total) by mouth at bedtime. 180 capsule 3  . HUMIRA PEN 40 MG/0.8ML PNKT Inject 40 mg into the muscle every 14 (fourteen) days.     Marland Kitchen  ibuprofen (ADVIL,MOTRIN) 200 MG tablet Take 400 mg by mouth every 8 (eight) hours as needed for mild pain or moderate pain.     Marland Kitchen latanoprost (XALATAN) 0.005 % ophthalmic solution Place 1 drop into both eyes at bedtime.     . Multiple Vitamins-Minerals (MULTIVITAMIN WITH MINERALS) tablet Take 1 tablet by mouth daily.    . naproxen sodium (ALEVE) 220 MG tablet Take 220 mg by mouth.    . valACYclovir (VALTREX) 1000 MG tablet TAKE 1 TABLET BY MOUTH TWICE A DAY 20 tablet 0  . ALPRAZolam (XANAX) 0.25 MG tablet Take 1 tablet (0.25 mg total) by mouth 3 (three) times daily as needed for anxiety. 90 tablet 2  . predniSONE (DELTASONE) 50 MG tablet Take 1 tablet (50 mg total) by mouth daily. 5  tablet 0  . [DISCONTINUED] chlorproMAZINE (THORAZINE) 10 MG tablet As needed for migraine headache      No current facility-administered medications on file prior to visit.   Review of Systems All otherwise neg per pt    Objective:   Physical Exam BP (!) 152/84   Pulse 82   Temp 99.3 F (37.4 C)   Ht 5\' 10"  (1.778 m)   Wt 159 lb 6.4 oz (72.3 kg)   SpO2 100%   BMI 22.87 kg/m  VS noted,  Constitutional: Pt appears in NAD HENT: Head: NCAT.  Right Ear: External ear normal.  Left Ear: External ear normal.  Eyes: . Pupils are equal, round, and reactive to light. Conjunctivae and EOM are normal Nose: without d/c or deformity Neck: Neck supple. Gross normal ROM Cardiovascular: Normal rate and regular rhythm.   Pulmonary/Chest: Effort normal and breath sounds without rales or wheezing.  Abd:  Soft, NT, ND, + BS, no organomegaly Neurological: Pt is alert. At baseline orientation, motor grossly intact Skin: Skin is warm. No rashes, other new lesions, no LE edema Psychiatric: Pt behavior is normal without agitation  All otherwise neg per pt  Lab Results  Component Value Date   WBC 7.2 08/24/2019   HGB 15.9 08/24/2019   HCT 45.6 08/24/2019   PLT 225 08/24/2019   GLUCOSE 97 08/24/2019   CHOL 265 (H) 03/06/2018   TRIG 89.0 03/06/2018   HDL 64.80 03/06/2018   LDLDIRECT 135.3 08/24/2012   LDLCALC 183 (H) 03/06/2018   ALT 59 (H) 08/24/2019   AST 36 (H) 08/24/2019   NA 140 08/24/2019   K 4.1 08/24/2019   CL 101 08/24/2019   CREATININE 1.09 08/24/2019   BUN 12 08/24/2019   CO2 27 08/24/2019   TSH 1.35 03/06/2018   PSA 1.1 08/24/2019      Assessment & Plan:

## 2019-08-25 LAB — URINE CULTURE: Result:: NO GROWTH

## 2019-08-25 LAB — CBC WITH DIFFERENTIAL/PLATELET
Absolute Monocytes: 418 cells/uL (ref 200–950)
Basophils Absolute: 22 cells/uL (ref 0–200)
Basophils Relative: 0.3 %
Eosinophils Absolute: 94 cells/uL (ref 15–500)
Eosinophils Relative: 1.3 %
HCT: 45.6 % (ref 38.5–50.0)
Hemoglobin: 15.9 g/dL (ref 13.2–17.1)
Lymphs Abs: 2333 cells/uL (ref 850–3900)
MCH: 33.1 pg — ABNORMAL HIGH (ref 27.0–33.0)
MCHC: 34.9 g/dL (ref 32.0–36.0)
MCV: 94.8 fL (ref 80.0–100.0)
MPV: 10 fL (ref 7.5–12.5)
Monocytes Relative: 5.8 %
Neutro Abs: 4334 cells/uL (ref 1500–7800)
Neutrophils Relative %: 60.2 %
Platelets: 225 10*3/uL (ref 140–400)
RBC: 4.81 10*6/uL (ref 4.20–5.80)
RDW: 12.2 % (ref 11.0–15.0)
Total Lymphocyte: 32.4 %
WBC: 7.2 10*3/uL (ref 3.8–10.8)

## 2019-08-25 LAB — HEPATIC FUNCTION PANEL
AG Ratio: 2 (calc) (ref 1.0–2.5)
ALT: 59 U/L — ABNORMAL HIGH (ref 9–46)
AST: 36 U/L — ABNORMAL HIGH (ref 10–35)
Albumin: 4.7 g/dL (ref 3.6–5.1)
Alkaline phosphatase (APISO): 54 U/L (ref 35–144)
Bilirubin, Direct: 0.1 mg/dL (ref 0.0–0.2)
Globulin: 2.3 g/dL (calc) (ref 1.9–3.7)
Indirect Bilirubin: 0.3 mg/dL (calc) (ref 0.2–1.2)
Total Bilirubin: 0.4 mg/dL (ref 0.2–1.2)
Total Protein: 7 g/dL (ref 6.1–8.1)

## 2019-08-25 LAB — VITAMIN D 25 HYDROXY (VIT D DEFICIENCY, FRACTURES): Vit D, 25-Hydroxy: 89 ng/mL (ref 30–100)

## 2019-08-25 LAB — URINALYSIS, ROUTINE W REFLEX MICROSCOPIC
Bilirubin Urine: NEGATIVE
Glucose, UA: NEGATIVE
Hgb urine dipstick: NEGATIVE
Ketones, ur: NEGATIVE
Leukocytes,Ua: NEGATIVE
Nitrite: NEGATIVE
Protein, ur: NEGATIVE
Specific Gravity, Urine: 1.013 (ref 1.001–1.03)
pH: 6 (ref 5.0–8.0)

## 2019-08-25 LAB — BASIC METABOLIC PANEL
BUN: 12 mg/dL (ref 7–25)
CO2: 27 mmol/L (ref 20–32)
Calcium: 9.8 mg/dL (ref 8.6–10.3)
Chloride: 101 mmol/L (ref 98–110)
Creat: 1.09 mg/dL (ref 0.70–1.33)
Glucose, Bld: 97 mg/dL (ref 65–99)
Potassium: 4.1 mmol/L (ref 3.5–5.3)
Sodium: 140 mmol/L (ref 135–146)

## 2019-08-25 LAB — PSA: PSA: 1.1 ng/mL (ref <=4.0)

## 2019-08-25 LAB — VITAMIN B12: Vitamin B-12: 970 pg/mL (ref 200–1100)

## 2019-08-26 ENCOUNTER — Encounter: Payer: Self-pay | Admitting: Internal Medicine

## 2019-08-26 ENCOUNTER — Other Ambulatory Visit: Payer: Self-pay | Admitting: Internal Medicine

## 2019-08-26 DIAGNOSIS — R7989 Other specified abnormal findings of blood chemistry: Secondary | ICD-10-CM

## 2019-08-26 DIAGNOSIS — R945 Abnormal results of liver function studies: Secondary | ICD-10-CM

## 2019-08-26 NOTE — Assessment & Plan Note (Signed)
Also for cxr r/o pna 

## 2019-08-26 NOTE — Assessment & Plan Note (Signed)
No significiant worsening, o/w stable overall by history and exam, recent data reviewed with pt, and pt to continue medical treatment as before,  to f/u any worsening symptoms or concerns

## 2019-08-26 NOTE — Assessment & Plan Note (Addendum)
Ill defined, flu like it seems, covid neg recent and pt declines further covid /flu repeat, but will check labs as ordered, and work note given  I spent 32 minutes in preparing to see the patient by review of recent labs, imaging and procedures, obtaining and reviewing separately obtained history, communicating with the patient and family or caregiver, ordering medications, tests or procedures, and documenting clinical information in the EHR including the differential Dx, treatment, and any further evaluation and other management of fever, CP, anxiety

## 2019-09-03 ENCOUNTER — Other Ambulatory Visit: Payer: 59

## 2019-09-03 ENCOUNTER — Other Ambulatory Visit: Payer: Self-pay | Admitting: Internal Medicine

## 2019-09-03 NOTE — Telephone Encounter (Signed)
Done erx 

## 2019-09-06 ENCOUNTER — Ambulatory Visit: Payer: Self-pay | Admitting: *Deleted

## 2019-09-06 NOTE — Telephone Encounter (Signed)
   Reason for Disposition . COVID-19 vaccine, Frequently Asked Questions (FAQs)  Answer Assessment - Initial Assessment Questions 1. MAIN CONCERN OR SYMPTOM:  "What is your main concern right now?" "What question do you have?" "What's the main symptom you're worried about?" (e.g., fever, pain, redness, swelling)     Is it safe to do COVID vaccine with Humira- Shot was Tuesday  Protocols used: CORONAVIRUS (COVID-19) VACCINE QUESTIONS AND REACTIONS-A-AH  Patient takes the flu shot without problem. No reactions from any other vaccine.  Per [employer]: It is recommended  For immunocompromised patients and those on immunosuppressant drugs. Advised to double check with Rheumatologist about specific timing - if any. Patient has work schedule that is difficult to schedule around- and we don't have a convenient opening at this time- they will check back.

## 2019-09-07 ENCOUNTER — Telehealth: Payer: Self-pay

## 2019-09-07 NOTE — Telephone Encounter (Signed)
Since sickness, patient is having more severe chronic fatigue. Requesting something that may help him sleep better due to rotating shifts, or something that would help his energy level? Specifically requested B12 Injections. Also, does Dr Jenny Reichmann this it is okay to get COVID vaccine even with more severe chronic fatigue? CB#: [phone]

## 2019-09-07 NOTE — Telephone Encounter (Signed)
B12 was normal on mar 12 so this would not be needed  [facility] for covid shot, but he should consider taking tylenol 650 mg and Benadryl 50 mg about 1 hour prior to the shot

## 2019-09-10 ENCOUNTER — Ambulatory Visit: Payer: 59 | Attending: Internal Medicine

## 2019-09-10 DIAGNOSIS — Z23 Encounter for immunization: Secondary | ICD-10-CM

## 2019-09-10 NOTE — Progress Notes (Signed)
   Covid-19 Vaccination Clinic  Name:  Randy Moran    MRN: [id number] DOB: [birth year]  09/10/2019  Randy Moran was observed post Covid-19 immunization for 15 minutes without incident. He was provided with Vaccine Information Sheet and instruction to access the V-Safe system.   Randy Moran was instructed to call 911 with any severe reactions post vaccine: Marland Kitchen Difficulty breathing  . Swelling of face and throat  . A fast heartbeat  . A bad rash all over body  . Dizziness and weakness   Immunizations Administered    Name Date Dose VIS Date Route   Pfizer COVID-19 Vaccine 09/10/2019 10:34 AM 0.3 mL 05/25/2019 Intramuscular   Manufacturer: [place]   Lot: [id number]   [place]: [id number]

## 2019-09-10 NOTE — Progress Notes (Signed)
   Covid-19 Vaccination Clinic  Name:  Randy Moran    MRN: [id number] DOB: 28-Dec-1960  09/10/2019  Randy Moran was observed post Covid-19 immunization for 15 minutes without incident. He was provided with Vaccine Information Sheet and instruction to access the V-Safe system.   Randy Moran was instructed to call 911 with any severe reactions post vaccine: Marland Kitchen Difficulty breathing  . Swelling of face and throat  . A fast heartbeat  . A bad rash all over body  . Dizziness and weakness   Immunizations Administered    Name Date Dose VIS Date Route   Pfizer COVID-19 Vaccine 09/10/2019 10:34 AM 0.3 mL 05/25/2019 Intramuscular   Manufacturer: [place]   Lot: [id number]   [place]: [id number]

## 2019-09-10 NOTE — Telephone Encounter (Signed)
Called and spoke to wife Joelene Millin due to patient currently getting the COVID vaccine.  Told her message from Dr. Jenny Reichmann.  Wife was okay with message

## 2019-09-12 ENCOUNTER — Ambulatory Visit
Admission: RE | Admit: 2019-09-12 | Discharge: 2019-09-12 | Disposition: A | Discharge status: Home / Self Care | Payer: 59 | Source: Ambulatory Visit | Attending: Internal Medicine | Admitting: Internal Medicine

## 2019-09-12 DIAGNOSIS — R7989 Other specified abnormal findings of blood chemistry: Secondary | ICD-10-CM

## 2019-09-12 DIAGNOSIS — R945 Abnormal results of liver function studies: Secondary | ICD-10-CM

## 2019-10-01 ENCOUNTER — Telehealth: Payer: Self-pay | Admitting: Internal Medicine

## 2019-10-01 NOTE — Telephone Encounter (Signed)
Pt is scheduled for an OV 10/22/19.  Pt's wife reported that pt has been experiencing diarrhea, bloating, and severe abd pain for months.  She inquired whether blood work would be appropriate prior to OV to rule out H. pylori.

## 2019-10-01 NOTE — Telephone Encounter (Signed)
I left a message for the patient's wife that would generally not perform blood work for h pylori.  Needs to keep the appt for 5/10 to determine the course of tx

## 2019-10-03 ENCOUNTER — Ambulatory Visit: Payer: 59 | Attending: Internal Medicine

## 2019-10-03 DIAGNOSIS — Z23 Encounter for immunization: Secondary | ICD-10-CM

## 2019-10-03 NOTE — Progress Notes (Signed)
   Covid-19 Vaccination Clinic  Name:  Randy Moran    MRN: [id number] DOB: 10-Dec-1960  10/03/2019  Mr. Horwitz was observed post Covid-19 immunization for 15 minutes without incident. He was provided with Vaccine Information Sheet and instruction to access the V-Safe system.   Mr. Jeannot was instructed to call 911 with any severe reactions post vaccine: Marland Kitchen Difficulty breathing  . Swelling of face and throat  . A fast heartbeat  . A bad rash all over body  . Dizziness and weakness   Immunizations Administered    Name Date Dose VIS Date Route   Pfizer COVID-19 Vaccine 10/03/2019  9:03 AM 0.3 mL 08/08/2018 Intramuscular   Manufacturer: [place]   Lot: [id number]   [place]: [id number]

## 2019-10-22 ENCOUNTER — Other Ambulatory Visit (INDEPENDENT_AMBULATORY_CARE_PROVIDER_SITE_OTHER): Payer: 59

## 2019-10-22 ENCOUNTER — Encounter: Payer: Self-pay | Admitting: Internal Medicine

## 2019-10-22 ENCOUNTER — Ambulatory Visit (INDEPENDENT_AMBULATORY_CARE_PROVIDER_SITE_OTHER): Payer: 59 | Admitting: Internal Medicine

## 2019-10-22 VITALS — BP 120/74 | HR 80 | Temp 99.5°F | Wt 160.2 lb

## 2019-10-22 DIAGNOSIS — K529 Noninfective gastroenteritis and colitis, unspecified: Secondary | ICD-10-CM

## 2019-10-22 DIAGNOSIS — K580 Irritable bowel syndrome with diarrhea: Secondary | ICD-10-CM

## 2019-10-22 DIAGNOSIS — R1031 Right lower quadrant pain: Secondary | ICD-10-CM | POA: Diagnosis not present

## 2019-10-22 DIAGNOSIS — R1032 Left lower quadrant pain: Secondary | ICD-10-CM

## 2019-10-22 DIAGNOSIS — R748 Abnormal levels of other serum enzymes: Secondary | ICD-10-CM

## 2019-10-22 LAB — IGA: IgA: 164 mg/dL (ref 68–378)

## 2019-10-22 LAB — CBC WITH DIFFERENTIAL/PLATELET
Basophils Absolute: 0 10*3/uL (ref 0.0–0.1)
Basophils Relative: 0.6 % (ref 0.0–3.0)
Eosinophils Absolute: 0.1 10*3/uL (ref 0.0–0.7)
Eosinophils Relative: 2.3 % (ref 0.0–5.0)
HCT: 41.2 % (ref 39.0–52.0)
Hemoglobin: 14.4 g/dL (ref 13.0–17.0)
Lymphocytes Relative: 49.6 % — ABNORMAL HIGH (ref 12.0–46.0)
Lymphs Abs: 2.9 10*3/uL (ref 0.7–4.0)
MCHC: 35 g/dL (ref 30.0–36.0)
MCV: 94.4 fl (ref 78.0–100.0)
Monocytes Absolute: 0.4 10*3/uL (ref 0.1–1.0)
Monocytes Relative: 6.1 % (ref 3.0–12.0)
Neutro Abs: 2.4 10*3/uL (ref 1.4–7.7)
Neutrophils Relative %: 41.4 % — ABNORMAL LOW (ref 43.0–77.0)
Platelets: 199 10*3/uL (ref 150.0–400.0)
RBC: 4.37 Mil/uL (ref 4.22–5.81)
RDW: 12.9 % (ref 11.5–15.5)
WBC: 5.9 10*3/uL (ref 4.0–10.5)

## 2019-10-22 LAB — HEPATIC FUNCTION PANEL
ALT: 27 U/L (ref 0–53)
AST: 20 U/L (ref 0–37)
Albumin: 4.3 g/dL (ref 3.5–5.2)
Alkaline Phosphatase: 53 U/L (ref 39–117)
Bilirubin, Direct: 0.1 mg/dL (ref 0.0–0.3)
Total Bilirubin: 0.4 mg/dL (ref 0.2–1.2)
Total Protein: 6.8 g/dL (ref 6.0–8.3)

## 2019-10-22 LAB — C-REACTIVE PROTEIN: CRP: <1 mg/dL (ref 0.5–20.0)

## 2019-10-22 LAB — SEDIMENTATION RATE: Sed Rate: 4 mm/hr (ref 0–20)

## 2019-10-22 MED ORDER — DICYCLOMINE HCL 20 MG PO TABS: 20.0000 mg | ORAL_TABLET | Freq: Four times a day (QID) | ORAL | 0 refills | Status: DC | PRN

## 2019-10-22 NOTE — Patient Instructions (Signed)
Your provider has requested that you go to the basement level for lab work before leaving today. Press "B" on the elevator. The lab is located at the first door on the left as you exit the elevator.   We will contact you with results and plans.   We have sent the following medications to your pharmacy for you to pick up at your convenience: Dicyclomine   I appreciate the opportunity to care for you. Silvano Rusk, MD, [facility]

## 2019-10-22 NOTE — Progress Notes (Signed)
Randy Moran 59 y.o. 04/29/1961 [id number]  Assessment & Plan:   Encounter Diagnoses  Name Primary?  . Chronic diarrhea Yes  . Bilateral lower abdominal pain   . Abnormal transaminases   . Irritable bowel syndrome with diarrhea     This seems like a likely IBS diarrhea predominant diagnosis but he is a patient with autoimmune disease and at risk for inflammatory bowel disease perhaps celiac disease.  Work-up as below.  Orders Placed This Encounter  Procedures  . Sedimentation rate  . C-reactive protein  . Tissue transglutaminase, IgA  . IgA  . Hepatic function panel  . CBC with Differential/Platelet  . Fecal lactoferrin, quant    Trial of dicyclomine in addition to the Imodium.  If the above work-up is unrevealing consider empiric treatment for SIBO versus breath testing versus observation.  He and I both think it is not yet time for a colonoscopy.  I appreciate the opportunity to care for this patient. CC: Biagio Borg, MD Dr. Leigh Aurora   Subjective:   Chief Complaint: Abdominal pain diarrhea  HPI [name] is a 59 year old white man here because of intermittent lower abdominal pains especially in the left nausea bloating and diarrhea problems, in the setting of psoriatic arthritis treated by Humira and a family history of colon cancer with negative colonoscopy 2019.  He has had problems for several months and was urged by his wife to come for evaluation.  He reports having issues where he will have stools that are watery or loose and he has postprandial diarrhea in the morning and this will continue unless he takes Imodium.  Imodium helps relieve this but by day 3 of the cycle if he does not continue to take Imodium the symptoms return.  He thinks it might have got a bit worse in March or April.  Life has been stressful worrying about getting Covid etc. so he wonders if that might have happened.  There is no significant sleep disturbance.  He has not had  Covid he has been vaccinated for it.  He does not really eat much dairy unless its cottage cheese and that does not bother him.  If he eats sugar-free candy it is a real problem so he does not do so and he does not think he eats a lot of other artificial sweeteners.  There has been no weight loss see below.  He tried probiotics without relief and denies bleeding or other symptoms.  Wt Readings from Last 3 Encounters:  10/22/19 160 lb 4 oz (72.7 kg)  08/24/19 159 lb 6.4 oz (72.3 kg)  04/03/19 161 lb (73 kg)   He does think his psoriatic arthritis might be worse with more aches and pains and wonders if the Humira is still effective.  He is followed by Dr. Amil Amen.  He had some abnormal transaminases in March and Dr. Jenny Reichmann performed an ultrasound that was unrevealing.  He was febrile at that time and might of had a viral syndrome. Allergies  Allergen Reactions  . Augmentin [Amoxicillin-Pot Clavulanate] Diarrhea  . Statins Other (See Comments)    Fatigue and weakness  . Sulfamethoxazole-Trimethoprim     REACTION: rash  . Levofloxacin Rash   Current Meds  Medication Sig  . acetaminophen (TYLENOL) 500 MG tablet Take 500-1,000 mg by mouth every 6 (six) hours as needed for mild pain, moderate pain or headache.   . cefdinir (OMNICEF) 300 MG capsule Take 1 capsule (300 mg total) by mouth 2 (two)  times daily.  Marland Kitchen gabapentin (NEURONTIN) 100 MG capsule Take 2 capsules (200 mg total) by mouth at bedtime.  Marland Kitchen HUMIRA PEN 40 MG/0.8ML PNKT Inject 40 mg into the muscle every 14 (fourteen) days.   Marland Kitchen ibuprofen (ADVIL,MOTRIN) 200 MG tablet Take 400 mg by mouth every 8 (eight) hours as needed for mild pain or moderate pain.   Marland Kitchen latanoprost (XALATAN) 0.005 % ophthalmic solution Place 1 drop into both eyes at bedtime.   . Multiple Vitamins-Minerals (MULTIVITAMIN WITH MINERALS) tablet Take 1 tablet by mouth daily.  . naproxen sodium (ALEVE) 220 MG tablet Take 220 mg by mouth.  . valACYclovir (VALTREX) 1000 MG tablet  TAKE 1 TABLET BY MOUTH TWICE A DAY  . zolpidem (AMBIEN) 10 MG tablet TAKE 1 TABLET BY MOUTH AT BEDTIME IF NEEDED FOR SLEEP.   Past Medical History:  Diagnosis Date  . Allergy   . Anxiety   . ANXIETY 03/22/2007   Qualifier: Diagnosis of  By: Jenny Reichmann MD, Hunt Oris   . Asthma    as a child  . DJD (degenerative joint disease)   . GERD (gastroesophageal reflux disease)    in the past  . Glaucoma left eye  . Hx of colonic polyps   . Hyperlipidemia   . Insomnia   . Lower back pain   . Migraines    treated by [facility]  . Myofascial pain syndrome   . Prostatitis   . Psoriatic arthritis ([place]) 03/04/2015  . Restless leg syndrome 08/25/2012   Past Surgical History:  Procedure Laterality Date  . ABDOMINAL EXPOSURE N/A 07/01/2017   Procedure: ABDOMINAL EXPOSURE;  Surgeon: Rosetta Posner, MD;  Location: [facility] OR;  Service: Vascular;  Laterality: N/A;  . ANTERIOR LUMBAR FUSION N/A 07/01/2017   Procedure: Lumbar Five-Sacral one Anterior lumbar interbody fusion;  Surgeon: Kristeen Miss, MD;  Location: [place];  Service: Neurosurgery;  Laterality: N/A;  . APPENDECTOMY  1967  . bilateral inguinal herniorrhaphy     1960's  . bilateral shoulder surgery  2006, 2007,2010  . COLONOSCOPY    . ganglion cyst surgery  2009  . HERNIA REPAIR  09/30/11   bilateral inguinal hernia  . knee surgery x 2  2004,2008  . LUMBAR SPINE SURGERY  07/01/2017   Lumbar Five-Sacral one Anterior lumbar interbody fusion (N/A Spine Lumbar)  . 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   Review of Systems See HPI  Objective:   Physical Exam BP 120/74   Pulse 80   Temp 99.5 F (37.5 C)   Wt 160 lb 4 oz (72.7 kg)   BMI 22.99 kg/m  Well-developed well-nourished white man in no acute distress Abdomen is soft with some mild bilateral lower quadrant tenderness no organomegaly or mass bowel sounds present

## 2019-10-23 LAB — TISSUE TRANSGLUTAMINASE, IGA: (tTG) Ab, IgA: 1 U/mL

## 2019-10-26 ENCOUNTER — Other Ambulatory Visit: Payer: 59

## 2019-10-26 DIAGNOSIS — K529 Noninfective gastroenteritis and colitis, unspecified: Secondary | ICD-10-CM

## 2019-10-26 DIAGNOSIS — R1031 Right lower quadrant pain: Secondary | ICD-10-CM

## 2019-10-26 DIAGNOSIS — K580 Irritable bowel syndrome with diarrhea: Secondary | ICD-10-CM

## 2019-10-26 DIAGNOSIS — R1032 Left lower quadrant pain: Secondary | ICD-10-CM

## 2019-10-26 DIAGNOSIS — R748 Abnormal levels of other serum enzymes: Secondary | ICD-10-CM

## 2019-10-30 LAB — FECAL LACTOFERRIN, QUANT
Fecal Lactoferrin: NEGATIVE
MICRO NUMBER:: 10479027
SPECIMEN QUALITY:: ADEQUATE

## 2019-11-21 ENCOUNTER — Other Ambulatory Visit: Payer: Self-pay | Admitting: Internal Medicine

## 2019-12-07 DIAGNOSIS — S32010A Wedge compression fracture of first lumbar vertebra, initial encounter for closed fracture: Secondary | ICD-10-CM | POA: Insufficient documentation

## 2019-12-15 ENCOUNTER — Encounter: Payer: Self-pay | Admitting: Internal Medicine

## 2019-12-24 NOTE — Telephone Encounter (Signed)
Ok to let pt know he can have this done anytime by making a nurse visit appt

## 2019-12-26 ENCOUNTER — Telehealth: Payer: Self-pay | Admitting: Internal Medicine

## 2019-12-26 MED ORDER — ZOSTER VAC RECOMB ADJUVANTED 50 MCG/0.5ML IM SUSR: 0.5000 mL | Freq: Once | INTRAMUSCULAR | 1 refills | Status: AC

## 2019-12-26 NOTE — Telephone Encounter (Signed)
New message:   Pt's wife is calling and states the pt would like something sent to [facility] for the pt to get his shingles shot there. Pt's wife states it is covered at 100% there and would prefer to go there. She states that Dr. Jenny Reichmann did it for her. Please advise.

## 2019-12-26 NOTE — Addendum Note (Signed)
Addended by: Biagio Borg on: 12/26/2019 01:15 PM   Modules accepted: Orders

## 2019-12-26 NOTE — Telephone Encounter (Signed)
Ok rx for shingrx sent to [facility]

## 2020-01-25 ENCOUNTER — Other Ambulatory Visit (HOSPITAL_COMMUNITY): Payer: Self-pay | Admitting: Neurological Surgery

## 2020-01-25 DIAGNOSIS — M79604 Pain in right leg: Secondary | ICD-10-CM

## 2020-01-25 DIAGNOSIS — M79605 Pain in left leg: Secondary | ICD-10-CM

## 2020-01-28 ENCOUNTER — Ambulatory Visit (HOSPITAL_COMMUNITY): Payer: 59

## 2020-01-31 ENCOUNTER — Telehealth: Payer: Self-pay | Admitting: Internal Medicine

## 2020-01-31 MED ORDER — AMPHETAMINE-DEXTROAMPHET ER 30 MG PO CP24: 30.0000 mg | ORAL_CAPSULE | ORAL | 0 refills | Status: DC

## 2020-01-31 NOTE — Telephone Encounter (Signed)
Sent to Dr. John. 

## 2020-01-31 NOTE — Telephone Encounter (Signed)
    Spouse calling to request new order for Adderall be written. Also requesting letter stating patient doesn't need to wear a mask at work because of his anxiety.  Appointment scheduled 9/7

## 2020-01-31 NOTE — Telephone Encounter (Signed)
[place] for the adderall, but I would not feel comfortable with the letter about no mask at work due to anxiety

## 2020-02-01 ENCOUNTER — Ambulatory Visit (HOSPITAL_COMMUNITY)
Admission: RE | Admit: 2020-02-01 | Discharge: 2020-02-01 | Disposition: A | Discharge status: Home / Self Care | Payer: 59 | Source: Ambulatory Visit | Attending: Cardiovascular Disease | Admitting: Cardiovascular Disease

## 2020-02-01 ENCOUNTER — Encounter (HOSPITAL_COMMUNITY): Payer: 59

## 2020-02-01 ENCOUNTER — Other Ambulatory Visit: Payer: Self-pay

## 2020-02-01 DIAGNOSIS — M79605 Pain in left leg: Secondary | ICD-10-CM | POA: Insufficient documentation

## 2020-02-01 DIAGNOSIS — M79604 Pain in right leg: Secondary | ICD-10-CM | POA: Insufficient documentation

## 2020-02-19 ENCOUNTER — Ambulatory Visit: Payer: 59 | Admitting: Internal Medicine

## 2020-02-22 ENCOUNTER — Telehealth: Payer: Self-pay | Admitting: Internal Medicine

## 2020-02-22 NOTE — Telephone Encounter (Signed)
Wife called to cancel appointment for her husband on Tuesday 9.14.21. Husband had cataract surgery and is having complications. They can't get the pressure in his eye down

## 2020-02-26 ENCOUNTER — Ambulatory Visit: Payer: 59 | Admitting: Internal Medicine

## 2020-02-26 DIAGNOSIS — S22080A Wedge compression fracture of T11-T12 vertebra, initial encounter for closed fracture: Secondary | ICD-10-CM | POA: Insufficient documentation

## 2020-02-26 DIAGNOSIS — G8929 Other chronic pain: Secondary | ICD-10-CM | POA: Insufficient documentation

## 2020-03-20 ENCOUNTER — Other Ambulatory Visit: Payer: 59

## 2020-03-24 ENCOUNTER — Other Ambulatory Visit: Payer: 59

## 2020-03-24 DIAGNOSIS — Z20822 Contact with and (suspected) exposure to covid-19: Secondary | ICD-10-CM

## 2020-03-25 LAB — SARS-COV-2, NAA 2 DAY TAT

## 2020-03-25 LAB — NOVEL CORONAVIRUS, NAA: SARS-CoV-2, NAA: NOT DETECTED

## 2020-05-07 DIAGNOSIS — R030 Elevated blood-pressure reading, without diagnosis of hypertension: Secondary | ICD-10-CM | POA: Insufficient documentation

## 2020-05-11 ENCOUNTER — Other Ambulatory Visit: Payer: Self-pay | Admitting: Internal Medicine

## 2020-05-13 ENCOUNTER — Telehealth: Payer: Self-pay | Admitting: Internal Medicine

## 2020-05-13 ENCOUNTER — Encounter: Payer: Self-pay | Admitting: Internal Medicine

## 2020-05-13 NOTE — Telephone Encounter (Signed)
Patient wondering if he can get his blood work done before his appointment on 12.06.21

## 2020-05-14 ENCOUNTER — Other Ambulatory Visit: Payer: Self-pay | Admitting: Internal Medicine

## 2020-05-14 DIAGNOSIS — E559 Vitamin D deficiency, unspecified: Secondary | ICD-10-CM

## 2020-05-14 DIAGNOSIS — E538 Deficiency of other specified B group vitamins: Secondary | ICD-10-CM

## 2020-05-14 DIAGNOSIS — Z0000 Encounter for general adult medical examination without abnormal findings: Secondary | ICD-10-CM

## 2020-05-14 NOTE — Telephone Encounter (Signed)
Sent to Dr. John. 

## 2020-05-14 NOTE — Telephone Encounter (Signed)
Labs ordered for [facility] lab

## 2020-05-16 ENCOUNTER — Other Ambulatory Visit (INDEPENDENT_AMBULATORY_CARE_PROVIDER_SITE_OTHER): Payer: 59

## 2020-05-16 DIAGNOSIS — E538 Deficiency of other specified B group vitamins: Secondary | ICD-10-CM

## 2020-05-16 DIAGNOSIS — Z0000 Encounter for general adult medical examination without abnormal findings: Secondary | ICD-10-CM | POA: Diagnosis not present

## 2020-05-16 DIAGNOSIS — E559 Vitamin D deficiency, unspecified: Secondary | ICD-10-CM

## 2020-05-16 LAB — HEPATIC FUNCTION PANEL
ALT: 23 U/L (ref 0–53)
AST: 17 U/L (ref 0–37)
Albumin: 4.6 g/dL (ref 3.5–5.2)
Alkaline Phosphatase: 46 U/L (ref 39–117)
Bilirubin, Direct: 0.1 mg/dL (ref 0.0–0.3)
Total Bilirubin: 0.5 mg/dL (ref 0.2–1.2)
Total Protein: 7.5 g/dL (ref 6.0–8.3)

## 2020-05-16 LAB — LIPID PANEL
Cholesterol: 259 mg/dL — ABNORMAL HIGH (ref 0–200)
HDL: 64.6 mg/dL (ref >39.00)
LDL Cholesterol: 168 mg/dL — ABNORMAL HIGH (ref 0–99)
NonHDL: 194.36
Total CHOL/HDL Ratio: 4
Triglycerides: 131 mg/dL (ref 0.0–149.0)
VLDL: 26.2 mg/dL (ref 0.0–40.0)

## 2020-05-16 LAB — URINALYSIS, ROUTINE W REFLEX MICROSCOPIC
Bilirubin Urine: NEGATIVE
Hgb urine dipstick: NEGATIVE
Ketones, ur: NEGATIVE
Leukocytes,Ua: NEGATIVE
Nitrite: NEGATIVE
Specific Gravity, Urine: 1.025 (ref 1.000–1.030)
Total Protein, Urine: NEGATIVE
Urine Glucose: NEGATIVE
Urobilinogen, UA: 0.2 (ref 0.0–1.0)
pH: 6 (ref 5.0–8.0)

## 2020-05-16 LAB — CBC WITH DIFFERENTIAL/PLATELET
Basophils Absolute: 0 10*3/uL (ref 0.0–0.1)
Basophils Relative: 0.3 % (ref 0.0–3.0)
Eosinophils Absolute: 0 10*3/uL (ref 0.0–0.7)
Eosinophils Relative: 0.1 % (ref 0.0–5.0)
HCT: 44 % (ref 39.0–52.0)
Hemoglobin: 15.3 g/dL (ref 13.0–17.0)
Lymphocytes Relative: 35.4 % (ref 12.0–46.0)
Lymphs Abs: 2.6 10*3/uL (ref 0.7–4.0)
MCHC: 34.9 g/dL (ref 30.0–36.0)
MCV: 97.3 fl (ref 78.0–100.0)
Monocytes Absolute: 0.6 10*3/uL (ref 0.1–1.0)
Monocytes Relative: 8.3 % (ref 3.0–12.0)
Neutro Abs: 4.2 10*3/uL (ref 1.4–7.7)
Neutrophils Relative %: 55.9 % (ref 43.0–77.0)
Platelets: 217 10*3/uL (ref 150.0–400.0)
RBC: 4.52 Mil/uL (ref 4.22–5.81)
RDW: 12.9 % (ref 11.5–15.5)
WBC: 7.5 10*3/uL (ref 4.0–10.5)

## 2020-05-16 LAB — TSH: TSH: 2.21 u[IU]/mL (ref 0.35–4.50)

## 2020-05-16 LAB — BASIC METABOLIC PANEL
BUN: 15 mg/dL (ref 6–23)
CO2: 27 mEq/L (ref 19–32)
Calcium: 9.9 mg/dL (ref 8.4–10.5)
Chloride: 103 mEq/L (ref 96–112)
Creatinine, Ser: 1 mg/dL (ref 0.40–1.50)
GFR: 82.6 mL/min (ref >60.00)
Glucose, Bld: 101 mg/dL — ABNORMAL HIGH (ref 70–99)
Potassium: 3.9 mEq/L (ref 3.5–5.1)
Sodium: 141 mEq/L (ref 135–145)

## 2020-05-16 LAB — VITAMIN D 25 HYDROXY (VIT D DEFICIENCY, FRACTURES): VITD: 68.77 ng/mL (ref 30.00–100.00)

## 2020-05-16 LAB — VITAMIN B12: Vitamin B-12: 375 pg/mL (ref 211–911)

## 2020-05-19 ENCOUNTER — Encounter: Payer: 59 | Admitting: Internal Medicine

## 2020-06-04 ENCOUNTER — Ambulatory Visit: Payer: 59 | Admitting: Internal Medicine

## 2020-06-12 ENCOUNTER — Telehealth (INDEPENDENT_AMBULATORY_CARE_PROVIDER_SITE_OTHER): Payer: 59 | Admitting: Internal Medicine

## 2020-06-12 ENCOUNTER — Encounter: Payer: Self-pay | Admitting: Internal Medicine

## 2020-06-12 ENCOUNTER — Other Ambulatory Visit: Payer: Self-pay

## 2020-06-12 DIAGNOSIS — R059 Cough, unspecified: Secondary | ICD-10-CM

## 2020-06-12 MED ORDER — PREDNISONE 50 MG PO TABS: 50.0000 mg | ORAL_TABLET | Freq: Every day | ORAL | 0 refills | Status: AC

## 2020-06-12 MED ORDER — DOXYCYCLINE HYCLATE 100 MG PO TABS: 100.0000 mg | ORAL_TABLET | Freq: Two times a day (BID) | ORAL | 0 refills | Status: AC

## 2020-06-12 NOTE — Progress Notes (Signed)
Virtual Visit via Video Note  I connected with Randy Moran on 06/12/20 at  2:40 PM EST by a video enabled telemedicine application and verified that I am speaking with the correct person using two identifiers.  The patient and the provider were at separate locations throughout the entire encounter. Patient location: home, Provider location: work   I discussed the limitations of evaluation and management by telemedicine and the availability of in person appointments. The patient expressed understanding and agreed to proceed. The patient and the provider were the only parties present for the visit unless noted in HPI below.  History of Present Illness: The patient is a 59 y.o. man with visit for head and chest congestion with fever. Started last week and improved over the weekend. Started getting bad Monday. Having a lot of cough. Has cough and fever still. Denies SOB. Overall it is worsening significantly. Has tried otc for cough and this has not helped  Observations/Objective: Appearance: normal, breathing appears normal, some coughing during visit, productive cough, casual grooming, abdomen does not appear distended, throat not well visualized, mental status is A and O times 3  Assessment and Plan: See problem oriented charting  Follow Up Instructions: rx prednisone and doxycycline, if no improvement needs repeat visit or covid-19 testing  I discussed the assessment and treatment plan with the patient. The patient was provided an opportunity to ask questions and all were answered. The patient agreed with the plan and demonstrated an understanding of the instructions.   The patient was advised to call back or seek an in-person evaluation if the symptoms worsen or if the condition fails to improve as anticipated.  Myrlene Broker, MD

## 2020-06-12 NOTE — Assessment & Plan Note (Signed)
Given worsening will treat with doxycycline and prednisone. He is outside the window for monoclonal antibody and quarantine period so covid-19 testing is not needed at this time. If no improvement with treatment needs further assessment.

## 2020-06-17 ENCOUNTER — Encounter: Payer: Self-pay | Admitting: Internal Medicine

## 2020-06-19 ENCOUNTER — Encounter: Payer: Self-pay | Admitting: Internal Medicine

## 2020-07-10 ENCOUNTER — Telehealth: Payer: 59 | Admitting: Internal Medicine

## 2020-07-17 ENCOUNTER — Other Ambulatory Visit: Payer: Self-pay

## 2020-07-17 ENCOUNTER — Encounter: Payer: Self-pay | Admitting: Internal Medicine

## 2020-07-17 ENCOUNTER — Telehealth: Payer: Self-pay | Admitting: Internal Medicine

## 2020-07-17 ENCOUNTER — Telehealth (INDEPENDENT_AMBULATORY_CARE_PROVIDER_SITE_OTHER): Payer: 59 | Admitting: Internal Medicine

## 2020-07-17 ENCOUNTER — Other Ambulatory Visit: Payer: Self-pay | Admitting: Internal Medicine

## 2020-07-17 ENCOUNTER — Other Ambulatory Visit (INDEPENDENT_AMBULATORY_CARE_PROVIDER_SITE_OTHER): Payer: 59

## 2020-07-17 ENCOUNTER — Ambulatory Visit (INDEPENDENT_AMBULATORY_CARE_PROVIDER_SITE_OTHER)
Admission: RE | Admit: 2020-07-17 | Discharge: 2020-07-17 | Disposition: A | Discharge status: Home / Self Care | Payer: 59 | Source: Ambulatory Visit | Attending: Internal Medicine | Admitting: Internal Medicine

## 2020-07-17 DIAGNOSIS — R059 Cough, unspecified: Secondary | ICD-10-CM

## 2020-07-17 DIAGNOSIS — G43809 Other migraine, not intractable, without status migrainosus: Secondary | ICD-10-CM | POA: Diagnosis not present

## 2020-07-17 DIAGNOSIS — R509 Fever, unspecified: Secondary | ICD-10-CM

## 2020-07-17 LAB — URINALYSIS, ROUTINE W REFLEX MICROSCOPIC
Bilirubin Urine: NEGATIVE
Hgb urine dipstick: NEGATIVE
Ketones, ur: NEGATIVE
Leukocytes,Ua: NEGATIVE
Nitrite: NEGATIVE
RBC / HPF: NONE SEEN (ref 0–?)
Specific Gravity, Urine: 1.015 (ref 1.000–1.030)
Total Protein, Urine: NEGATIVE
Urine Glucose: NEGATIVE
Urobilinogen, UA: 0.2 (ref 0.0–1.0)
WBC, UA: NONE SEEN (ref 0–?)
pH: 6 (ref 5.0–8.0)

## 2020-07-17 LAB — HEPATIC FUNCTION PANEL
ALT: 24 U/L (ref 0–53)
AST: 19 U/L (ref 0–37)
Albumin: 4.2 g/dL (ref 3.5–5.2)
Alkaline Phosphatase: 39 U/L (ref 39–117)
Bilirubin, Direct: 0.1 mg/dL (ref 0.0–0.3)
Total Bilirubin: 0.6 mg/dL (ref 0.2–1.2)
Total Protein: 6.8 g/dL (ref 6.0–8.3)

## 2020-07-17 LAB — BASIC METABOLIC PANEL
BUN: 11 mg/dL (ref 6–23)
CO2: 30 mEq/L (ref 19–32)
Calcium: 9.2 mg/dL (ref 8.4–10.5)
Chloride: 104 mEq/L (ref 96–112)
Creatinine, Ser: 0.9 mg/dL (ref 0.40–1.50)
GFR: 93.62 mL/min (ref >60.00)
Glucose, Bld: 89 mg/dL (ref 70–99)
Potassium: 3.8 mEq/L (ref 3.5–5.1)
Sodium: 139 mEq/L (ref 135–145)

## 2020-07-17 LAB — CBC WITH DIFFERENTIAL/PLATELET
Basophils Absolute: 0 10*3/uL (ref 0.0–0.1)
Basophils Relative: 0.4 % (ref 0.0–3.0)
Eosinophils Absolute: 0 10*3/uL (ref 0.0–0.7)
Eosinophils Relative: 0.7 % (ref 0.0–5.0)
HCT: 42.4 % (ref 39.0–52.0)
Hemoglobin: 14.7 g/dL (ref 13.0–17.0)
Lymphocytes Relative: 47.4 % — ABNORMAL HIGH (ref 12.0–46.0)
Lymphs Abs: 2.5 10*3/uL (ref 0.7–4.0)
MCHC: 34.7 g/dL (ref 30.0–36.0)
MCV: 94.9 fl (ref 78.0–100.0)
Monocytes Absolute: 0.4 10*3/uL (ref 0.1–1.0)
Monocytes Relative: 7.4 % (ref 3.0–12.0)
Neutro Abs: 2.3 10*3/uL (ref 1.4–7.7)
Neutrophils Relative %: 44.1 % (ref 43.0–77.0)
Platelets: 209 10*3/uL (ref 150.0–400.0)
RBC: 4.47 Mil/uL (ref 4.22–5.81)
RDW: 13.8 % (ref 11.5–15.5)
WBC: 5.2 10*3/uL (ref 4.0–10.5)

## 2020-07-17 MED ORDER — HYDROCODONE-HOMATROPINE 5-1.5 MG/5ML PO SYRP: 5.0000 mL | ORAL_SOLUTION | Freq: Four times a day (QID) | ORAL | 0 refills | Status: DC | PRN

## 2020-07-17 MED ORDER — DOXYCYCLINE HYCLATE 100 MG PO TABS: 100.0000 mg | ORAL_TABLET | Freq: Two times a day (BID) | ORAL | 0 refills | Status: DC

## 2020-07-17 NOTE — Telephone Encounter (Signed)
    [facility] does not have promethazine-codeine (PHENERGAN WITH CODEINE) 6.25-10 MG/5ML syrup, back order for 6 months  Please send to [id number] - [place], [place]

## 2020-07-17 NOTE — Progress Notes (Signed)
Patient ID: Randy Moran, male   DOB: 1960-12-14, 60 y.o.   MRN: [id number]  Virtual Visit via Video Note  I connected with Randy Moran on 07/17/20 at 10:40 AM EST by a video enabled telemedicine application and verified that I am speaking with the correct person using two identifiers.  Location of all participants today Patient: at home with wife Provider: at office   I discussed the limitations of evaluation and management by telemedicine and the availability of in person appointments. The patient expressed understanding and agreed to proceed.  History of Present Illness: Here with c/o cough and fever; has been struggling with URI like symptoms intermittent since dec 2021, did not test covid but improved after antibx and steroid tx per Dr Sharlet Salina and felt ok to take his humira in early jan 2021; has started to feel ill again in the past 1.5 wks, but much worse in the past 3 days with feverish feeling, sinus congestion, severe HA somewhat similar to migraine but worse in severity and multiple times per day with pain mostly left temple, wants to sleep all the time, fatigue, has dry cough, some nausea intermittent, no vomiting, and slight diarrhea.  Did have a covid test neg yesterday, plans to repeat today.   Past Medical History:  Diagnosis Date  . Allergy   . Anxiety   . ANXIETY 03/22/2007   Qualifier: Diagnosis of  By: Jenny Reichmann MD, Hunt Oris   . Asthma    as a child  . DJD (degenerative joint disease)   . GERD (gastroesophageal reflux disease)    in the past  . Glaucoma left eye  . Hx of colonic polyps   . Hyperlipidemia   . Insomnia   . Lower back pain   . Migraines    treated by [facility]  . Myofascial pain syndrome   . Prostatitis   . Psoriatic arthritis ([place]) 03/04/2015  . Restless leg syndrome 08/25/2012   Past Surgical History:  Procedure Laterality Date  . ABDOMINAL EXPOSURE N/A 07/01/2017   Procedure: ABDOMINAL EXPOSURE;  Surgeon: Rosetta Posner, MD;  Location[facility]  OR;  Service: Vascular;  Laterality: N/A;  . ANTERIOR LUMBAR FUSION N/A 07/01/2017   Procedure: Lumbar Five-Sacral one Anterior lumbar interbody fusion;  Surgeon: Kristeen Miss, MD;  Location: [place];  Service: Neurosurgery;  Laterality: N/A;  . APPENDECTOMY  1967  . bilateral inguinal herniorrhaphy     1960's  . bilateral shoulder surgery  2006, 2007,2010  . COLONOSCOPY    . ganglion cyst surgery  2009  . HERNIA REPAIR  09/30/11   bilateral inguinal hernia  . knee surgery x 2  2004,2008  . LUMBAR SPINE SURGERY  07/01/2017   Lumbar Five-Sacral one Anterior lumbar interbody fusion (N/A Spine Lumbar)  . UPPER GASTROINTESTINAL ENDOSCOPY      reports that he has never smoked. He has never used smokeless tobacco. He reports that he does not drink alcohol and does not use drugs. family history includes COPD in his father; Cancer in his maternal aunt and maternal uncle; Colon cancer in his maternal uncle; Colon cancer (age of onset: 60) in his mother; Colon polyps in his sister; Coronary artery disease in an other family member; Hyperlipidemia in an other family member; Hypertension in an other family member. Allergies  Allergen Reactions  . Augmentin [Amoxicillin-Pot Clavulanate] Diarrhea  . Statins Other (See Comments)    Fatigue and weakness  . Sulfamethoxazole-Trimethoprim     REACTION: rash  . Levofloxacin  Rash   Current Outpatient Medications on File Prior to Visit  Medication Sig Dispense Refill  . acetaminophen (TYLENOL) 500 MG tablet Take 500-1,000 mg by mouth every 6 (six) hours as needed for mild pain, moderate pain or headache.     Marland Kitchen HUMIRA PEN 40 MG/0.8ML PNKT Inject 40 mg into the muscle every 14 (fourteen) days.     Marland Kitchen latanoprost (XALATAN) 0.005 % ophthalmic solution Place 1 drop into both eyes at bedtime.     . naproxen sodium (ALEVE) 220 MG tablet Take 220 mg by mouth.    . valACYclovir (VALTREX) 1000 MG tablet TAKE 1 TABLET BY MOUTH TWICE DAILY. 20 tablet 0  . [DISCONTINUED]  chlorproMAZINE (THORAZINE) 10 MG tablet As needed for migraine headache      No current facility-administered medications on file prior to visit.    Observations/Objective: Alert, NAD, appropriate mood and affect, resps normal, cn 2-12 intact, moves all 4s, no visible rash or swelling Lab Results  Component Value Date   WBC 5.2 07/17/2020   HGB 14.7 07/17/2020   HCT 42.4 07/17/2020   PLT 209.0 07/17/2020   GLUCOSE 89 07/17/2020   CHOL 259 (H) 05/16/2020   TRIG 131.0 05/16/2020   HDL 64.60 05/16/2020   LDLDIRECT 135.3 08/24/2012   LDLCALC 168 (H) 05/16/2020   ALT 24 07/17/2020   AST 19 07/17/2020   NA 139 07/17/2020   K 3.8 07/17/2020   CL 104 07/17/2020   CREATININE 0.90 07/17/2020   BUN 11 07/17/2020   CO2 30 07/17/2020   TSH 2.21 05/16/2020   PSA 1.1 08/24/2019   Assessment and Plan: See notes  Follow Up Instructions: See notes   I discussed the assessment and treatment plan with the patient. The patient was provided an opportunity to ask questions and all were answered. The patient agreed with the plan and demonstrated an understanding of the instructions.   The patient was advised to call back or seek an in-person evaluation if the symptoms worsen or if the condition fails to improve as anticipated.   Cathlean Cower, MD

## 2020-07-18 ENCOUNTER — Encounter: Payer: Self-pay | Admitting: Internal Medicine

## 2020-07-18 ENCOUNTER — Telehealth: Payer: Self-pay | Admitting: Internal Medicine

## 2020-07-18 MED ORDER — IBUPROFEN 800 MG PO TABS: 800.0000 mg | ORAL_TABLET | Freq: Three times a day (TID) | ORAL | 0 refills | Status: DC | PRN

## 2020-07-18 MED ORDER — SUMATRIPTAN SUCCINATE 100 MG PO TABS: 100.0000 mg | ORAL_TABLET | ORAL | 5 refills | Status: DC | PRN

## 2020-07-18 MED ORDER — GUAIFENESIN-CODEINE 100-10 MG/5ML PO SOLN: 5.0000 mL | Freq: Four times a day (QID) | ORAL | 0 refills | Status: DC | PRN

## 2020-07-18 NOTE — Telephone Encounter (Signed)
Pt's wife is requesting Imitrex, not a narcotic.

## 2020-07-18 NOTE — Telephone Encounter (Signed)
Patients wife called and said that the patient has woken up with a migraine. She said that he has been having them more frequently. She was wondering if there was something else that could be called in. Please call the patients wife back at [phone] if rx is sent.      [place], [place]

## 2020-07-18 NOTE — Telephone Encounter (Signed)
Pt notified via voice mail

## 2020-07-18 NOTE — Telephone Encounter (Signed)
Very sorry, I would not be comfortable with narcotic medication at this time, thanks

## 2020-07-18 NOTE — Telephone Encounter (Signed)
Pts wife called and stated that the ibuprofen is not helping at all with the migraine and wants to know if there is something stronger that can be called in. If so please call her a call. Thanks.

## 2020-07-18 NOTE — Telephone Encounter (Signed)
Ok I sent in the ibuprofen 800 mg to take as needed, as the HA is likely also from the covid infection, as well

## 2020-07-18 NOTE — Addendum Note (Signed)
Addended by: Biagio Borg on: 07/18/2020 05:11 PM   Modules accepted: Orders

## 2020-07-20 ENCOUNTER — Encounter: Payer: Self-pay | Admitting: Internal Medicine

## 2020-07-20 NOTE — Patient Instructions (Signed)
Please take all new medication as prescribed  If home covid test neg, then for cxr and labs

## 2020-07-20 NOTE — Assessment & Plan Note (Signed)
Ok to continue advil prn,  to f/u any worsening symptoms or concerns

## 2020-07-20 NOTE — Assessment & Plan Note (Signed)
Also for cxr and labs as ordered, r/o pna,  to f/u any worsening symptoms or concerns

## 2020-07-20 NOTE — Assessment & Plan Note (Signed)
Mild to mod, c/w sinusitis etiology unclear, for repeat covid testing today, for antibx course,  Cough med prn, work note to be given, and to f/u any worsening symptoms or concerns

## 2020-07-29 ENCOUNTER — Other Ambulatory Visit: Payer: Self-pay

## 2020-07-29 ENCOUNTER — Encounter: Payer: Self-pay | Admitting: Internal Medicine

## 2020-07-29 ENCOUNTER — Ambulatory Visit (INDEPENDENT_AMBULATORY_CARE_PROVIDER_SITE_OTHER): Payer: 59 | Admitting: Internal Medicine

## 2020-07-29 VITALS — BP 118/76 | HR 81 | Temp 98.3°F | Ht 70.0 in | Wt 159.0 lb

## 2020-07-29 DIAGNOSIS — E559 Vitamin D deficiency, unspecified: Secondary | ICD-10-CM

## 2020-07-29 DIAGNOSIS — E538 Deficiency of other specified B group vitamins: Secondary | ICD-10-CM

## 2020-07-29 DIAGNOSIS — Z0000 Encounter for general adult medical examination without abnormal findings: Secondary | ICD-10-CM | POA: Diagnosis not present

## 2020-07-29 DIAGNOSIS — R059 Cough, unspecified: Secondary | ICD-10-CM

## 2020-07-29 NOTE — Patient Instructions (Addendum)
Please consider the covid booster at 90 days after covid infection.   Please continue all other medications as before, and refills have been done if requested.  Please have the pharmacy call with any other refills you may need.  Please continue your efforts at being more active, low cholesterol diet, and weight control.  You are otherwise up to date with prevention measures today.  Please keep your appointments with your specialists as you may have planned  Please make an Appointment to return for your 1 year visit, or sooner if needed, with Lab testing by Appointment as well, to be done about 3-5 days before at the [facility] (so this is for TWO appointments - please see the scheduling desk as you leave)  Due to the ongoing Covid 19 pandemic, our lab now requires an appointment for any labs done at our office.  If you need labs done and do not have an appointment, please call our office ahead of time to schedule before presenting to the lab for your testing.

## 2020-07-29 NOTE — Assessment & Plan Note (Signed)
Last rapid test neg for covid x 2 wks; ok for tess perle

## 2020-07-29 NOTE — Progress Notes (Signed)
Patient ID: Randy Moran, male   DOB: 10/02/1960, 60 y.o.   MRN: [id number]         Chief Complaint:: wellness exam        HPI:  Randy Moran is a 60 y.o. male here for wellness exam;  decliens tdap, flu and covid booster for now. Pt is  S/p covid infection dec 2021, still with non prod cough worse to lie down, ? Post nasal gtt related.  Pt denies chest pain, increased sob or doe, wheezing, orthopnea, PND, increased LE swelling, palpitations, dizziness or syncope. cxr feb3 - neg.  Has also ? Brain fog with difficulty concentrating at work. Not in trouble at work with job performance.  Sp vax x 2, not sure if he will get the booster.   No other new compalints   Pt denies fever, wt loss, night sweats, loss of appetite, or other constitutional symptoms     Wt Readings from Last 3 Encounters:  07/29/20 159 lb (72.1 kg)  10/22/19 160 lb 4 oz (72.7 kg)  08/24/19 159 lb 6.4 oz (72.3 kg)   BP Readings from Last 3 Encounters:  07/29/20 118/76  10/22/19 120/74  08/24/19 (!) 152/84   Immunization History  Administered Date(s) Administered  . Influenza Split 02/25/2012, 02/19/2013  . Influenza Whole 03/06/2009, 02/12/2010, 04/19/2011  . Influenza,inj,Quad PF,6+ Mos 03/04/2015, 02/20/2016, 02/25/2017, 03/06/2018, 04/05/2019  . Influenza-Unspecified 03/22/2014, 04/05/2019  . PFIZER(Purple Top)SARS-COV-2 Vaccination 09/10/2019, 10/03/2019  . Td 04/18/2009  . Zoster Recombinat (Shingrix) 02/01/2020, 05/13/2020   There are no preventive care reminders to display for this patient.   Past Medical History:  Diagnosis Date  . Allergy   . Anxiety   . ANXIETY 03/22/2007   Qualifier: Diagnosis of  By: Jenny Reichmann MD, Hunt Oris   . Asthma    as a child  . DJD (degenerative joint disease)   . GERD (gastroesophageal reflux disease)    in the past  . Glaucoma left eye  . Hx of colonic polyps   . Hyperlipidemia   . Insomnia   . Lower back pain   . Migraines    treated by [facility]  . Myofascial  pain syndrome   . Prostatitis   . Psoriatic arthritis ([place]) 03/04/2015  . Restless leg syndrome 08/25/2012   Past Surgical History:  Procedure Laterality Date  . ABDOMINAL EXPOSURE N/A 07/01/2017   Procedure: ABDOMINAL EXPOSURE;  Surgeon: Rosetta Posner, MD;  Location: [facility] OR;  Service: Vascular;  Laterality: N/A;  . ANTERIOR LUMBAR FUSION N/A 07/01/2017   Procedure: Lumbar Five-Sacral one Anterior lumbar interbody fusion;  Surgeon: Kristeen Miss, MD;  Location: [place];  Service: Neurosurgery;  Laterality: N/A;  . APPENDECTOMY  1967  . bilateral inguinal herniorrhaphy     1960's  . bilateral shoulder surgery  2006, 2007,2010  . COLONOSCOPY    . ganglion cyst surgery  2009  . HERNIA REPAIR  09/30/11   bilateral inguinal hernia  . knee surgery x 2  2004,2008  . LUMBAR SPINE SURGERY  07/01/2017   Lumbar Five-Sacral one Anterior lumbar interbody fusion (N/A Spine Lumbar)  . UPPER GASTROINTESTINAL ENDOSCOPY      reports that he has never smoked. He has never used smokeless tobacco. He reports that he does not drink alcohol and does not use drugs. family history includes COPD in his father; Cancer in his maternal aunt and maternal uncle; Colon cancer in his maternal uncle; Colon cancer (age of onset: 42) in his mother; Colon  polyps in his sister; Coronary artery disease in an other family member; Hyperlipidemia in an other family member; Hypertension in an other family member. Allergies  Allergen Reactions  . Augmentin [Amoxicillin-Pot Clavulanate] Diarrhea  . Statins Other (See Comments)    Fatigue and weakness  . Sulfamethoxazole-Trimethoprim     REACTION: rash  . Levofloxacin Rash   Current Outpatient Medications on File Prior to Visit  Medication Sig Dispense Refill  . acetaminophen (TYLENOL) 500 MG tablet Take 500-1,000 mg by mouth every 6 (six) hours as needed for mild pain, moderate pain or headache.     . guaiFENesin-codeine 100-10 MG/5ML syrup Take 5 mLs by mouth every 6 (six) hours  as needed for cough. 180 mL 0  . HUMIRA PEN 40 MG/0.8ML PNKT Inject 40 mg into the muscle every 14 (fourteen) days.     Marland Kitchen ibuprofen (ADVIL) 800 MG tablet Take 1 tablet (800 mg total) by mouth every 8 (eight) hours as needed. 30 tablet 0  . latanoprost (XALATAN) 0.005 % ophthalmic solution Place 1 drop into both eyes at bedtime.     . naproxen sodium (ALEVE) 220 MG tablet Take 220 mg by mouth.    . SUMAtriptan (IMITREX) 100 MG tablet Take 1 tablet (100 mg total) by mouth every 2 (two) hours as needed for migraine or headache. May repeat in 2 hours if headache persists or recurs. 10 tablet 5  . valACYclovir (VALTREX) 1000 MG tablet TAKE 1 TABLET BY MOUTH TWICE DAILY. 20 tablet 0  . doxycycline (VIBRA-TABS) 100 MG tablet Take 1 tablet (100 mg total) by mouth 2 (two) times daily. (Patient not taking: Reported on 07/29/2020) 20 tablet 0  . [DISCONTINUED] chlorproMAZINE (THORAZINE) 10 MG tablet As needed for migraine headache     No current facility-administered medications on file prior to visit.        ROS:  All others reviewed and negative.  Objective        PE:  BP 118/76   Pulse 81   Temp 98.3 F (36.8 C) (Oral)   Ht 5\' 10"  (1.778 m)   Wt 159 lb (72.1 kg)   SpO2 98%   BMI 22.81 kg/m                 Constitutional: Pt appears in NAD               HENT: Head: NCAT.                Right Ear: External ear normal.                 Left Ear: External ear normal.                Eyes: . Pupils are equal, round, and reactive to light. Conjunctivae and EOM are normal               Nose: without d/c or deformity               Neck: Neck supple. Gross normal ROM               Cardiovascular: Normal rate and regular rhythm.                 Pulmonary/Chest: Effort normal and breath sounds without rales or wheezing.                Abd:  Soft, NT, ND, + BS, no organomegaly  Neurological: Pt is alert. At baseline orientation, motor grossly intact               Skin: Skin is warm. No  rashes, no other new lesions, LE edema - none               Psychiatric: Pt behavior is normal without agitation   Micro: none  Cardiac tracings I have personally interpreted today:  none  Pertinent Radiological findings (summarize):Jul 17 2020  cxr IMPRESSION: No active cardiopulmonary disease.   Lab Results  Component Value Date   WBC 5.2 07/17/2020   HGB 14.7 07/17/2020   HCT 42.4 07/17/2020   PLT 209.0 07/17/2020   GLUCOSE 89 07/17/2020   CHOL 259 (H) 05/16/2020   TRIG 131.0 05/16/2020   HDL 64.60 05/16/2020   LDLDIRECT 135.3 08/24/2012   LDLCALC 168 (H) 05/16/2020   ALT 24 07/17/2020   AST 19 07/17/2020   NA 139 07/17/2020   K 3.8 07/17/2020   CL 104 07/17/2020   CREATININE 0.90 07/17/2020   BUN 11 07/17/2020   CO2 30 07/17/2020   TSH 2.21 05/16/2020   PSA 1.1 08/24/2019   Assessment/Plan:  ZAN ORLICK is a 60 y.o. White or Caucasian [1] male with  has a past medical history of Allergy, Anxiety, ANXIETY (03/22/2007), Asthma, DJD (degenerative joint disease), GERD (gastroesophageal reflux disease), Glaucoma (left eye), colonic polyps, Hyperlipidemia, Insomnia, Lower back pain, Migraines, Myofascial pain syndrome, Prostatitis, Psoriatic arthritis ([place]) (03/04/2015), and Restless leg syndrome (08/25/2012).  Cough Last rapid test neg for covid x 2 wks; ok for tess perle  Preventative health care Age and sex appropriate education and counseling updated with regular exercise and diet Referrals for preventative services - none needed Immunizations addressed - declines tap, flu and covid booster Smoking counseling  - none needed Evidence for depression or other mood disorder - none significant Most recent labs reviewed. I have personally reviewed and have noted: 1) the patient's medical and social history 2) The patient's current medications and supplements 3) The patient's height, weight, and BMI have been recorded in the chart   Followup: Return in about 1 year  (around 07/29/2021).  Cathlean Cower, MD 08/04/2020 10:18 PM [facility]

## 2020-08-04 ENCOUNTER — Encounter: Payer: Self-pay | Admitting: Internal Medicine

## 2020-08-04 NOTE — Assessment & Plan Note (Signed)
Age and sex appropriate education and counseling updated with regular exercise and diet Referrals for preventative services - none needed Immunizations addressed - declines tap, flu and covid booster Smoking counseling  - none needed Evidence for depression or other mood disorder - none significant Most recent labs reviewed. I have personally reviewed and have noted: 1) the patient's medical and social history 2) The patient's current medications and supplements 3) The patient's height, weight, and BMI have been recorded in the chart

## 2020-08-06 ENCOUNTER — Encounter: Payer: Self-pay | Admitting: Internal Medicine

## 2020-08-06 ENCOUNTER — Telehealth (INDEPENDENT_AMBULATORY_CARE_PROVIDER_SITE_OTHER): Payer: 59 | Admitting: Family

## 2020-08-06 ENCOUNTER — Other Ambulatory Visit: Payer: Self-pay | Admitting: Family

## 2020-08-06 DIAGNOSIS — U071 COVID-19: Secondary | ICD-10-CM | POA: Diagnosis not present

## 2020-08-06 DIAGNOSIS — D849 Immunodeficiency, unspecified: Secondary | ICD-10-CM

## 2020-08-06 NOTE — Telephone Encounter (Signed)
Error

## 2020-08-06 NOTE — Progress Notes (Signed)
Randy Moran is a 60 y.o. male with the following history as recorded in EpicCare:  Patient Active Problem List   Diagnosis Date Noted  . Cough 06/12/2020  . Fever 08/24/2019  . Chest pain 08/24/2019  . Concentration deficit 10/17/2018  . Allergic rhinitis 09/13/2018  . Acute sinus infection 09/13/2018  . Family history of colon cancer in mother - 2's 05/17/2018  . Fatigue 03/06/2018  . 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 Anxiety 08/06/2016  . Insomnia 08/06/2016  . Tarsal tunnel syndrome of right side 07/30/2016  . Other specified arthritis, right shoulder 03/25/2016  . 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 Preventative health care 03/04/2015  . Psoriatic arthritis ([place]) 03/04/2015  . GERD (gastroesophageal reflux disease) 03/04/2015  . Migraine - managed by Dr. Ir[facility] 09/28/2013  . Glaucoma - [facility] by [facility] 09/28/2013  . DEGENERATIVE JOINT DISEASE - Managed by Dr. Nelva Bush at [place] 04/07/2008  . Hyperlipidemia 03/22/2007  . LOW BACK PAIN 03/22/2007    Current Outpatient Medications  Medication Sig Dispense Refill  . acetaminophen (TYLENOL) 500 MG tablet Take 500-1,000 mg by mouth every 6 (six) hours as needed for mild pain, moderate pain or headache.     . doxycycline (VIBRA-TABS) 100 MG tablet Take 1 tablet (100 mg total) by mouth 2 (two) times daily. (Patient not taking: Reported on 07/29/2020) 20 tablet 0  . guaiFENesin-codeine 100-10 MG/5ML syrup Take 5 mLs by mouth every 6 (six) hours as needed for cough. 180 mL 0  . HUMIRA PEN 40 MG/0.8ML PNKT Inject 40 mg into the muscle every 14 (fourteen) days.     Marland Kitchen ibuprofen (ADVIL) 800 MG tablet Take 1 tablet (800 mg total) by mouth every 8 (eight) hours as needed. 30 tablet 0  . latanoprost (XALATAN) 0.005 % ophthalmic solution Place 1 drop into both eyes at bedtime.     . naproxen sodium (ALEVE) 220 MG tablet Take 220 mg by mouth.    .  SUMAtriptan (IMITREX) 100 MG tablet Take 1 tablet (100 mg total) by mouth every 2 (two) hours as needed for migraine or headache. May repeat in 2 hours if headache persists or recurs. 10 tablet 5  . valACYclovir (VALTREX) 1000 MG tablet TAKE 1 TABLET BY MOUTH TWICE DAILY. 20 tablet 0   No current facility-administered medications for this visit.    Allergies: Augmentin [amoxicillin-pot clavulanate], Statins, Sulfamethoxazole-trimethoprim, and Levofloxacin  Past Medical History:  Diagnosis Date  . Allergy   . Anxiety   . ANXIETY 03/22/2007   Qualifier: Diagnosis of  By: Jenny Reichmann MD, Hunt Oris   . Asthma    as a child  . DJD (degenerative joint disease)   . GERD (gastroesophageal reflux disease)    in the past  . Glaucoma left eye  . Hx of colonic polyps   . Hyperlipidemia   . Insomnia   . Lower back pain   . Migraines    treated by [facility]  . Myofascial pain syndrome   . Prostatitis   . Psoriatic arthritis ([place]) 03/04/2015  . Restless leg syndrome 08/25/2012    Past Surgical History:  Procedure Laterality Date  . ABDOMINAL EXPOSURE N/A 07/01/2017   Procedure: ABDOMINAL EXPOSURE;  Surgeon: Rosetta Posner, MD;  Location: [facility] OR;  Service: Vascular;  Laterality: N/A;  . ANTERIOR LUMBAR FUSION N/A 07/01/2017   Procedure: Lumbar Five-Sacral one Anterior lumbar interbody fusion;  Surgeon: Kristeen Miss, MD;  Location: [place];  Service: Neurosurgery;  Laterality: N/A;  . APPENDECTOMY  1967  . bilateral inguinal herniorrhaphy     1960's  . bilateral shoulder surgery  2006, 2007,2010  . COLONOSCOPY    . ganglion cyst surgery  2009  . HERNIA REPAIR  09/30/11   bilateral inguinal hernia  . knee surgery x 2  2004,2008  . LUMBAR SPINE SURGERY  07/01/2017   Lumbar Five-Sacral one Anterior lumbar interbody fusion (N/A Spine Lumbar)  . UPPER GASTROINTESTINAL ENDOSCOPY      Family History  Problem Relation Age of Onset  . Colon cancer Mother 65  . COPD Father   . Coronary artery disease Other    . Hyperlipidemia Other   . Hypertension Other   . Cancer Maternal Aunt        breast  . Cancer Maternal Uncle        prostate  . Colon cancer Maternal Uncle   . Colon polyps Sister   . Esophageal cancer Neg Hx   . Rectal cancer Neg Hx   . Stomach cancer Neg Hx     Social History   Tobacco Use  . Smoking status: Never Smoker  . Smokeless tobacco: Never Used  Substance Use Topics  . Alcohol use: No    Subjective:   I connected with KEY CEN on 08/06/20 at 11:40 AM EST by a video enabled telemedicine application and verified that I am speaking with the correct person using two identifiers.   I discussed the limitations of evaluation and management by telemedicine and the availability of in person appointments. The patient expressed understanding and agreed to proceed. Provider in office/ patient is at home; provider and patients wife and patient are only 3 people on video call.   Had COVID 2 months ago; he is concerned that he could have [place] due to persisting fatigue/ inability to concentrate/ body aches/ increased headaches; saw his PCP for check up last week- mentioned these symptoms but no follow up determined; situation is complicated by underlying psoriatic arthritis- rheumatology does not want him to take his Humira if there is any possibility of COVID related issue; so he readily admits that his arthritis could be contributing to symptoms;    Objective:  There were no vitals filed for this visit.  General: Well developed, well nourished, in no acute distress  Head: Normocephalic and atraumatic  Lungs: Respirations unlabored;  Neurologic: Alert and oriented; speech intact; face symmetrical;   Assessment:  1. COVID-19 in immunocompromised patient [facility])     Plan:  Patient is concerned for [place] was at the end of December; I will go ahead and put in referral to pulmonology clinic for further evaluation. He will need to see his PCP in  person to further discuss these concerns- patient will most likely need to be placed on short term disability while this is being evaluated and he understands I am not able to manage this type of paperwork for him; work note given for today and tomorrow; he is encouraged to try and go ahead and reach out to his HR as well in preparation for OV tomorrow.    No follow-ups on file.  Orders Placed This Encounter  Procedures  . Ambulatory referral to Pulmonology    Referral Priority:   Routine    Referral Type:   Consultation    Referral Reason:   Specialty Services Required    Requested Specialty:   Pulmonary Disease    Number of Visits  Requested:   1    Requested Prescriptions    No prescriptions requested or ordered in this encounter

## 2020-08-07 ENCOUNTER — Other Ambulatory Visit: Payer: Self-pay

## 2020-08-07 ENCOUNTER — Encounter: Payer: Self-pay | Admitting: Internal Medicine

## 2020-08-07 ENCOUNTER — Ambulatory Visit (INDEPENDENT_AMBULATORY_CARE_PROVIDER_SITE_OTHER): Payer: 59 | Admitting: Internal Medicine

## 2020-08-07 VITALS — BP 122/80 | HR 113 | Temp 98.1°F | Ht 70.0 in | Wt 159.0 lb

## 2020-08-07 DIAGNOSIS — U099 Post covid-19 condition, unspecified: Secondary | ICD-10-CM

## 2020-08-07 DIAGNOSIS — F419 Anxiety disorder, unspecified: Secondary | ICD-10-CM | POA: Diagnosis not present

## 2020-08-07 DIAGNOSIS — G43809 Other migraine, not intractable, without status migrainosus: Secondary | ICD-10-CM

## 2020-08-07 DIAGNOSIS — L4050 Arthropathic psoriasis, unspecified: Secondary | ICD-10-CM

## 2020-08-07 MED ORDER — PREDNISONE 10 MG PO TABS: ORAL_TABLET | ORAL | 0 refills | Status: DC

## 2020-08-07 NOTE — Patient Instructions (Signed)
Please take all new medication as prescribed - the prednisone  Please continue all other medications as before, and refills have been done if requested.  Please have the pharmacy call with any other refills you may need.  Please continue your efforts at being more active, low cholesterol diet, and weight control.  You are otherwise up to date with prevention measures today.  Please keep your appointments with your specialists as you may have planned  We can authorized FMLA and ST disability from Jul 27, 2020 to a return to work date of Oct 27, 2020

## 2020-08-07 NOTE — Progress Notes (Signed)
Patient ID: Randy Moran, male   DOB: 1960/12/01, 60 y.o.   MRN: [id number]        Chief Complaint: follow up post covid symtoms       HPI:  [name] is a 60 y.o. male here with c/o numerous symptoms s/p covid infection 2021 as well as anxiety and increased migraine frequency and severity.  Last worked feb 13, but since then has simply had subjective fatigue, recurring HA c/w typical migraine but increased frequency, severity and less amenable to tx lasting hours most days, tinnitus, cough, and nausea on top of chronic diarrhea /IBS as well.  Maybe eeven some gland swelling in the neck but not sure about that. Also with dizziness and very difficulty to concentrate which increased his anxiety as he has high job performance needs and low error tolerance.   Has f/u with rheumatology and asked about whether to continue humira for psoroiatc arthritis but advised no per now per pt  Pt has been so concerned he may make a work mistake due to his condition he is taking sick leave/vacation but will have to go back soon, and believe his chance of error is too much in his condition.  Pt denies chest pain, wheezing, orthopnea, PND, increased LE swelling, palpitations, dizziness or syncope. .    Wt Readings from Last 3 Encounters:  08/07/20 159 lb (72.1 kg)  07/29/20 159 lb (72.1 kg)  10/22/19 160 lb 4 oz (72.7 kg)   BP Readings from Last 3 Encounters:  08/07/20 122/80  07/29/20 118/76  10/22/19 120/74         Past Medical History:  Diagnosis Date  . Allergy   . Anxiety   . ANXIETY 03/22/2007   Qualifier: Diagnosis of  By: Jenny Reichmann MD, Hunt Oris   . Asthma    as a child  . DJD (degenerative joint disease)   . GERD (gastroesophageal reflux disease)    in the past  . Glaucoma left eye  . Hx of colonic polyps   . Hyperlipidemia   . Insomnia   . Lower back pain   . Migraines    treated by [facility]  . Myofascial pain syndrome   . Prostatitis   . Psoriatic arthritis ([place]) 03/04/2015  .  Restless leg syndrome 08/25/2012   Past Surgical History:  Procedure Laterality Date  . ABDOMINAL EXPOSURE N/A 07/01/2017   Procedure: ABDOMINAL EXPOSURE;  Surgeon: Rosetta Posner, MD;  Location: [facility];  Service: Vascular;  Laterality: N/A;  . ANTERIOR LUMBAR FUSION N/A 07/01/2017   Procedure: Lumbar Five-Sacral one Anterior lumbar interbody fusion;  Surgeon: Kristeen Miss, MD;  Location: [place];  Service: Neurosurgery;  Laterality: N/A;  . APPENDECTOMY  1967  . bilateral inguinal herniorrhaphy     1960's  . bilateral shoulder surgery  2006, 2007,2010  . COLONOSCOPY    . ganglion cyst surgery  2009  . HERNIA REPAIR  09/30/11   bilateral inguinal hernia  . knee surgery x 2  2004,2008  . LUMBAR SPINE SURGERY  07/01/2017   Lumbar Five-Sacral one Anterior lumbar interbody fusion (N/A Spine Lumbar)  . UPPER GASTROINTESTINAL ENDOSCOPY      reports that he has never smoked. He has never used smokeless tobacco. He reports that he does not drink alcohol and does not use drugs. family history includes COPD in his father; Cancer in his maternal aunt and maternal uncle; Colon cancer in his maternal uncle; Colon cancer (age of onset: 61) in his  mother; Colon polyps in his sister; Coronary artery disease in an other family member; Hyperlipidemia in an other family member; Hypertension in an other family member. Allergies  Allergen Reactions  . Augmentin [Amoxicillin-Pot Clavulanate] Diarrhea  . Statins Other (See Comments)    Fatigue and weakness  . Sulfamethoxazole-Trimethoprim     REACTION: rash  . Levofloxacin Rash   Current Outpatient Medications on File Prior to Visit  Medication Sig Dispense Refill  . acetaminophen (TYLENOL) 500 MG tablet Take 500-1,000 mg by mouth every 6 (six) hours as needed for mild pain, moderate pain or headache.     Marland Kitchen HUMIRA PEN 40 MG/0.8ML PNKT Inject 40 mg into the muscle every 14 (fourteen) days.     Marland Kitchen ibuprofen (ADVIL) 800 MG tablet Take 1 tablet (800 mg total) by  mouth every 8 (eight) hours as needed. 30 tablet 0  . latanoprost (XALATAN) 0.005 % ophthalmic solution Place 1 drop into both eyes at bedtime.     . naproxen sodium (ALEVE) 220 MG tablet Take 220 mg by mouth.    . ondansetron (ZOFRAN-ODT) 8 MG disintegrating tablet ondansetron 8 mg disintegrating tablet    . SUMAtriptan (IMITREX) 100 MG tablet Take 1 tablet (100 mg total) by mouth every 2 (two) hours as needed for migraine or headache. May repeat in 2 hours if headache persists or recurs. 10 tablet 5  . valACYclovir (VALTREX) 1000 MG tablet TAKE 1 TABLET BY MOUTH TWICE DAILY. 20 tablet 0  . doxycycline (VIBRA-TABS) 100 MG tablet Take 1 tablet (100 mg total) by mouth 2 (two) times daily. (Patient not taking: Reported on 08/07/2020) 20 tablet 0  . guaiFENesin-codeine 100-10 MG/5ML syrup Take 5 mLs by mouth every 6 (six) hours as needed for cough. (Patient not taking: Reported on 08/07/2020) 180 mL 0  . moxifloxacin (VIGAMOX) 0.5 % ophthalmic solution     . [DISCONTINUED] chlorproMAZINE (THORAZINE) 10 MG tablet As needed for migraine headache     No current facility-administered medications on file prior to visit.        ROS:  All others reviewed and negative.  Objective        PE:  BP 122/80   Pulse (!) 113   Temp 98.1 F (36.7 C) (Oral)   Ht 5\' 10"  (1.778 m)   Wt 159 lb (72.1 kg)   SpO2 98%   BMI 22.81 kg/m                 Constitutional: Pt appears in NAD               HENT: Head: NCAT.                Right Ear: External ear normal.                 Left Ear: External ear normal.                Eyes: . Pupils are equal, round, and reactive to light. Conjunctivae and EOM are normal               Nose: without d/c or deformity               Neck: Neck supple. Gross normal ROM               Cardiovascular: Normal rate and regular rhythm.                 Pulmonary/Chest: Effort normal and breath sounds without  rales or wheezing.                Abd:  Soft, NT, ND, + BS, no organomegaly                Neurological: Pt is alert. At baseline orientation, motor grossly intact               Skin: Skin is warm. No rashes, no other new lesions, LE edema - none               Psychiatric: Pt behavior is normal without agitation   Micro: none  Cardiac tracings I have personally interpreted today:  ECG - NSR 84  Pertinent Radiological findings (summarize): none   Lab Results  Component Value Date   WBC 5.2 07/17/2020   HGB 14.7 07/17/2020   HCT 42.4 07/17/2020   PLT 209.0 07/17/2020   GLUCOSE 89 07/17/2020   CHOL 259 (H) 05/16/2020   TRIG 131.0 05/16/2020   HDL 64.60 05/16/2020   LDLDIRECT 135.3 08/24/2012   LDLCALC 168 (H) 05/16/2020   ALT 24 07/17/2020   AST 19 07/17/2020   NA 139 07/17/2020   K 3.8 07/17/2020   CL 104 07/17/2020   CREATININE 0.90 07/17/2020   BUN 11 07/17/2020   CO2 30 07/17/2020   TSH 2.21 05/16/2020   PSA 1.1 08/24/2019   Assessment/Plan:  CURBY CARSWELL is a 60 y.o. White or Caucasian [1] male with  has a past medical history of Allergy, Anxiety, ANXIETY (03/22/2007), Asthma, DJD (degenerative joint disease), GERD (gastroesophageal reflux disease), Glaucoma (left eye), colonic polyps, Hyperlipidemia, Insomnia, Lower back pain, Migraines, Myofascial pain syndrome, Prostatitis, Psoriatic arthritis ([place]) (03/04/2015), and Restless leg syndrome (08/25/2012).  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 followj   Anxiety Tried to reassure, consider counseling referral  Migraine - managed by Dr. Irwin [facility] To continue f/u neurology  Followup: Return in about 3 months (around 11/04/2020), or if symptoms worsen or fail to improve.  Cathlean Cower, MD 08/10/2020 3:47 PM [facility]

## 2020-08-10 ENCOUNTER — Encounter: Payer: Self-pay | Admitting: Internal Medicine

## 2020-08-10 DIAGNOSIS — U099 Post covid-19 condition, unspecified: Secondary | ICD-10-CM | POA: Insufficient documentation

## 2020-08-10 NOTE — Assessment & Plan Note (Signed)
Tried to reassure, consider counseling referral

## 2020-08-10 NOTE — Assessment & Plan Note (Signed)
To continue f/u neurology

## 2020-08-10 NOTE — Assessment & Plan Note (Signed)
At least moderate it seems, ok for out of work feb 13 to to may 16, for predpac asd,  to f/u any worsening symptoms or concerns

## 2020-08-10 NOTE — Assessment & Plan Note (Signed)
To continue off humira for now, . followj

## 2020-08-12 ENCOUNTER — Other Ambulatory Visit: Payer: Self-pay | Admitting: Internal Medicine

## 2020-08-12 NOTE — Addendum Note (Signed)
Addended by: Shirlyn Goltz on: 08/12/2020 11:03 AM   Modules accepted: Orders

## 2020-08-13 ENCOUNTER — Other Ambulatory Visit: Payer: Self-pay | Admitting: Internal Medicine

## 2020-08-13 ENCOUNTER — Encounter: Payer: Self-pay | Admitting: Family

## 2020-08-13 MED ORDER — VALACYCLOVIR HCL 1 G PO TABS: 1000.0000 mg | ORAL_TABLET | Freq: Two times a day (BID) | ORAL | 0 refills | Status: DC

## 2020-08-13 NOTE — Addendum Note (Signed)
Addended by: Biagio Borg on: 08/13/2020 04:03 PM   Modules accepted: Orders

## 2020-08-15 ENCOUNTER — Other Ambulatory Visit: Payer: Self-pay | Admitting: Internal Medicine

## 2020-08-15 ENCOUNTER — Encounter: Payer: Self-pay | Admitting: Internal Medicine

## 2020-08-18 ENCOUNTER — Encounter: Payer: Self-pay | Admitting: Internal Medicine

## 2020-08-25 ENCOUNTER — Telehealth: Payer: Self-pay | Admitting: Internal Medicine

## 2020-08-25 NOTE — Telephone Encounter (Signed)
Appt schedule per provider

## 2020-08-25 NOTE — Telephone Encounter (Signed)
[place] for rov

## 2020-08-25 NOTE — Telephone Encounter (Signed)
Patients wife called and said that the patient was diagnosed with long Covid. She said that the patients rheumatologist said that it was okay for him to give himself his Humaria injection. She said after that he started to have cough and congestion again. Declined an appointment at this time but is requesting a call back at [phone]. Please advise

## 2020-08-28 ENCOUNTER — Other Ambulatory Visit: Payer: Self-pay

## 2020-08-28 ENCOUNTER — Ambulatory Visit (INDEPENDENT_AMBULATORY_CARE_PROVIDER_SITE_OTHER): Payer: 59

## 2020-08-28 ENCOUNTER — Ambulatory Visit (INDEPENDENT_AMBULATORY_CARE_PROVIDER_SITE_OTHER): Payer: 59 | Admitting: Internal Medicine

## 2020-08-28 VITALS — BP 140/92 | HR 104 | Temp 98.3°F | Ht 70.0 in | Wt 161.0 lb

## 2020-08-28 DIAGNOSIS — F419 Anxiety disorder, unspecified: Secondary | ICD-10-CM

## 2020-08-28 DIAGNOSIS — R059 Cough, unspecified: Secondary | ICD-10-CM | POA: Diagnosis not present

## 2020-08-28 DIAGNOSIS — J069 Acute upper respiratory infection, unspecified: Secondary | ICD-10-CM | POA: Diagnosis not present

## 2020-08-28 MED ORDER — AMOXICILLIN-POT CLAVULANATE 875-125 MG PO TABS: 1.0000 | ORAL_TABLET | Freq: Two times a day (BID) | ORAL | 0 refills | Status: DC

## 2020-08-28 MED ORDER — HYDROCOD POLST-CPM POLST ER 10-8 MG/5ML PO SUER: 5.0000 mL | Freq: Two times a day (BID) | ORAL | 0 refills | Status: DC | PRN

## 2020-08-28 NOTE — Progress Notes (Signed)
Patient ID: Randy Moran, male   DOB: September 26, 1960, 60 y.o.   MRN: [id number]        Chief Complaint: follow up Everything has gotten worse since the shot. He is now having problems with his gland, sore throat, coughing up blood and congestion, painful chest pains. Temp is 99.... is very sleepy a lot and weak. Patient also wants to know if his brain should be checked because of memory and brain fog.        HPI:  Randy Moran is a 60 y.o. male here with above as of mar 14.  .Pt had recent covid infection with persistent fatigue and cough, but now  Here with 2-3 days acute onset fever, facial pain, pressure, headache, general weakness and malaise, and greenish d/c, with mild ST and cough, but pt denies chest pain, wheezing, increased sob or doe, orthopnea, PND, increased LE swelling, palpitations, dizziness or syncope.  Remains anxious and hard to concentrate.   Pt denies polydipsia, polyuria, Denies new onset worsening neuro focal s/s.   Pt denies new wt loss, night sweats, loss of appetite, or other constitutional symptoms.  Denies worsening depressive symptoms, suicidal ideation, or panic       Wt Readings from Last 3 Encounters:  08/28/20 161 lb (73 kg)  08/07/20 159 lb (72.1 kg)  07/29/20 159 lb (72.1 kg)   BP Readings from Last 3 Encounters:  08/28/20 (!) 140/92  08/07/20 122/80  07/29/20 118/76         Past Medical History:  Diagnosis Date  . Allergy   . Anxiety   . ANXIETY 03/22/2007   Qualifier: Diagnosis of  By: Jenny Reichmann MD, Hunt Oris   . Asthma    as a child  . DJD (degenerative joint disease)   . GERD (gastroesophageal reflux disease)    in the past  . Glaucoma left eye  . Hx of colonic polyps   . Hyperlipidemia   . Insomnia   . Lower back pain   . Migraines    treated by [facility]  . Myofascial pain syndrome   . Prostatitis   . Psoriatic arthritis ([place]) 03/04/2015  . Restless leg syndrome 08/25/2012   Past Surgical History:  Procedure Laterality Date  . ABDOMINAL  EXPOSURE N/A 07/01/2017   Procedure: ABDOMINAL EXPOSURE;  Surgeon: Rosetta Posner, MD;  Location: [facility] OR;  Service: Vascular;  Laterality: N/A;  . ANTERIOR LUMBAR FUSION N/A 07/01/2017   Procedure: Lumbar Five-Sacral one Anterior lumbar interbody fusion;  Surgeon: Kristeen Miss, MD;  Location: [place] [facility];  Service: Neurosurgery;  Laterality: N/A;  . APPENDECTOMY  1967  . bilateral inguinal herniorrhaphy     1960's  . bilateral shoulder surgery  2006, 2007,2010  . COLONOSCOPY    . ganglion cyst surgery  2009  . HERNIA REPAIR  09/30/11   bilateral inguinal hernia  . knee surgery x 2  2004,2008  . LUMBAR SPINE SURGERY  07/01/2017   Lumbar Five-Sacral one Anterior lumbar interbody fusion (N/A Spine Lumbar)  . UPPER GASTROINTESTINAL ENDOSCOPY      reports that he has never smoked. He has never used smokeless tobacco. He reports that he does not drink alcohol and does not use drugs. family history includes COPD in his father; Cancer in his maternal aunt and maternal uncle; Colon cancer in his maternal uncle; Colon cancer (age of onset: 43) in his mother; Colon polyps in his sister; Coronary artery disease in an other family member; Hyperlipidemia in an other  family member; Hypertension in an other family member. Allergies  Allergen Reactions  . Statins Other (See Comments)    Fatigue and weakness   Current Outpatient Medications on File Prior to Visit  Medication Sig Dispense Refill  . acetaminophen (TYLENOL) 500 MG tablet Take 500-1,000 mg by mouth every 6 (six) hours as needed for mild pain, moderate pain or headache.     . clobetasol cream (TEMOVATE) 9.89 % 1 application to affected area Externally Twice a day as needed 30 days    . doxycycline (VIBRA-TABS) 100 MG tablet Take 1 tablet (100 mg total) by mouth 2 (two) times daily. 20 tablet 0  . HUMIRA PEN 40 MG/0.8ML PNKT Inject 40 mg into the muscle every 14 (fourteen) days.     Marland Kitchen ibuprofen (ADVIL) 800 MG tablet Take 1 tablet (800 mg total) by mouth  every 8 (eight) hours as needed. 30 tablet 0  . latanoprost (XALATAN) 0.005 % ophthalmic solution Place 1 drop into both eyes at bedtime.     . moxifloxacin (VIGAMOX) 0.5 % ophthalmic solution     . naproxen sodium (ALEVE) 220 MG tablet Take 220 mg by mouth.    . ondansetron (ZOFRAN-ODT) 8 MG disintegrating tablet ondansetron 8 mg disintegrating tablet    . predniSONE (DELTASONE) 10 MG tablet 3 tabs by mouth per day for 3 days,2tabs per day for 3 days,1tab per day for 3 days 18 tablet 0  . SUMAtriptan (IMITREX) 100 MG tablet Take 1 tablet (100 mg total) by mouth every 2 (two) hours as needed for migraine or headache. May repeat in 2 hours if headache persists or recurs. 10 tablet 5  . valACYclovir (VALTREX) 1000 MG tablet TAKE ONE TABLET BY MOUTH TWICE DAILY 20 tablet 0  . guaiFENesin-codeine 100-10 MG/5ML syrup Take 5 mLs by mouth every 6 (six) hours as needed for cough. (Patient not taking: Reported on 08/28/2020) 180 mL 0  . [DISCONTINUED] chlorproMAZINE (THORAZINE) 10 MG tablet As needed for migraine headache     No current facility-administered medications on file prior to visit.        ROS:  All others reviewed and negative.  Objective        PE:  BP (!) 140/92 (BP Location: Right Arm, Patient Position: Sitting, Cuff Size: Normal)   Pulse (!) 104   Temp 98.3 F (36.8 C) (Oral)   Ht 5\' 10"  (1.778 m)   Wt 161 lb (73 kg)   SpO2 98%   BMI 23.10 kg/m                 Constitutional: Pt appears in NAD               HENT: Head: NCAT.                Right Ear: External ear normal.                 Left Ear: External ear normal.                Bilat tm's with mild erythema.  Max sinus areas mild tender.  Pharynx with mild erythema, no exudate               Eyes: . Pupils are equal, round, and reactive to light. Conjunctivae and EOM are normal               Nose: without d/c or deformity but with rather moderate right > left submandibular tender LA noted  Neck: Neck supple.  Gross normal ROM               Cardiovascular: Normal rate and regular rhythm.                 Pulmonary/Chest: Effort normal and breath sounds without rales or wheezing.                Abd:  Soft, NT, ND, + BS, no organomegaly               Neurological: Pt is alert. At baseline orientation, motor grossly intact               Skin: Skin is warm. No rashes, no other new lesions, LE edema - none               Psychiatric: Pt behavior is normal without agitation   Micro: none  Cardiac tracings I have personally interpreted today:  none  Pertinent Radiological findings (summarize): none   Lab Results  Component Value Date   WBC 5.2 07/17/2020   HGB 14.7 07/17/2020   HCT 42.4 07/17/2020   PLT 209.0 07/17/2020   GLUCOSE 89 07/17/2020   CHOL 259 (H) 05/16/2020   TRIG 131.0 05/16/2020   HDL 64.60 05/16/2020   LDLDIRECT 135.3 08/24/2012   LDLCALC 168 (H) 05/16/2020   ALT 24 07/17/2020   AST 19 07/17/2020   NA 139 07/17/2020   K 3.8 07/17/2020   CL 104 07/17/2020   CREATININE 0.90 07/17/2020   BUN 11 07/17/2020   CO2 30 07/17/2020   TSH 2.21 05/16/2020   PSA 1.1 08/24/2019   Assessment/Plan:  Randy Moran is a 60 y.o. White or Caucasian [1] male with  has a past medical history of Allergy, Anxiety, ANXIETY (03/22/2007), Asthma, DJD (degenerative joint disease), GERD (gastroesophageal reflux disease), Glaucoma (left eye), colonic polyps, Hyperlipidemia, Insomnia, Lower back pain, Migraines, Myofascial pain syndrome, Prostatitis, Psoriatic arthritis ([place]) (03/04/2015), and Restless leg syndrome (08/25/2012).  Cough Also for cxr, f/o pna,  to f/u any worsening symptoms or concerns, for cough med prn  URI (upper respiratory infection) Mild to mod, for antibx course,  to f/u any worsening symptoms or concerns  Anxiety Stable chronic, no change in tx for now,  to f/u any worsening symptoms or concerns  Followup: Return if symptoms worsen or fail to improve.  Cathlean Cower, MD  08/31/2020 6:00 AM [facility]

## 2020-08-28 NOTE — Patient Instructions (Signed)
Please take all new medication as prescribed  - the antibiotic, and cough medicine as needed  Please go to the XRAY Department in the first floor for the x-ray testing  Please continue all other medications as before, and refills have been done if requested.  Please have the pharmacy call with any other refills you may need.  Please continue your efforts at being more active, low cholesterol diet, and weight control.  Please keep your appointments with your specialists as you may have planned  You will be contacted by phone if any changes need to be made immediately.  Otherwise, you will receive a letter about your results with an explanation, but please check with MyChart first.  Please remember to sign up for MyChart if you have not done so, as this will be important to you in the future with finding out test results, communicating by private email, and scheduling acute appointments online when needed.

## 2020-08-31 ENCOUNTER — Encounter: Payer: Self-pay | Admitting: Internal Medicine

## 2020-08-31 DIAGNOSIS — J069 Acute upper respiratory infection, unspecified: Secondary | ICD-10-CM | POA: Insufficient documentation

## 2020-08-31 NOTE — Assessment & Plan Note (Signed)
Also for cxr, f/o pna,  to f/u any worsening symptoms or concerns, for cough med prn

## 2020-08-31 NOTE — Assessment & Plan Note (Signed)
Mild to mod, for antibx course,  to f/u any worsening symptoms or concerns 

## 2020-08-31 NOTE — Assessment & Plan Note (Signed)
Stable chronic, no change in tx for now,  to f/u any worsening symptoms or concerns

## 2020-09-08 ENCOUNTER — Encounter: Payer: Self-pay | Admitting: Pulmonary Disease

## 2020-09-08 ENCOUNTER — Ambulatory Visit (INDEPENDENT_AMBULATORY_CARE_PROVIDER_SITE_OTHER): Payer: 59 | Admitting: Pulmonary Disease

## 2020-09-08 ENCOUNTER — Other Ambulatory Visit: Payer: Self-pay

## 2020-09-08 VITALS — BP 120/82 | HR 117 | Temp 97.8°F | Ht 69.0 in | Wt 161.2 lb

## 2020-09-08 DIAGNOSIS — R0602 Shortness of breath: Secondary | ICD-10-CM | POA: Diagnosis not present

## 2020-09-08 DIAGNOSIS — R059 Cough, unspecified: Secondary | ICD-10-CM | POA: Diagnosis not present

## 2020-09-08 NOTE — Progress Notes (Signed)
Randy Moran    [id number]    25-Aug-1960  Primary Care Physician:John, Hunt Oris, MD  Referring Physician: Marrian Salvage, [address] [place],  [place] [address]  Chief complaint:   Recurrent URIs post Covid  HPI:  Patient COVID in December 2021  Has used up to 3 courses of antibiotics recently for exacerbations -Cough, shortness of breath, phlegm production - He did have some hemoptysis with the cough and shortness of breath   Currently feeling much better Not at baseline but continues to improve  Underlying history of asthma as a child, no recent exacerbation Was feeling relatively well prior to Covid infection  Was not hospitalized for the Covid infection, was sick for about 2 weeks and then took about a couple more weeks to recover  Was on Humira for psoriatic arthritis  Never smoker but was exposed to secondhand smoke  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 acetaminophen (TYLENOL) 500 MG tablet Take 500-1,000 mg by mouth every 6 (six) hours as needed for mild pain, moderate pain or headache.   Marland Kitchen amoxicillin-clavulanate (AUGMENTIN) 875-125 MG tablet Take 1 tablet by mouth 2 (two) times daily.  . chlorpheniramine-HYDROcodone (TUSSIONEX PENNKINETIC ER) 10-8 MG/5ML SUER Take 5 mLs by mouth every 12 (twelve) hours as needed for cough.  . clobetasol cream (TEMOVATE) 9.48 % 1 application to affected area Externally Twice a day as needed 30 days  . guaiFENesin-codeine 100-10 MG/5ML syrup Take 5 mLs by mouth every 6 (six) hours as needed for cough.  Marland Kitchen HUMIRA PEN 40 MG/0.8ML PNKT Inject 40 mg into the muscle every 14 (fourteen) days.   Marland Kitchen ibuprofen (ADVIL) 800 MG tablet Take 1 tablet (800 mg total) by mouth every 8 (eight) hours as needed.  . latanoprost  (XALATAN) 0.005 % ophthalmic solution Place 1 drop into both eyes at bedtime.   . naproxen sodium (ALEVE) 220 MG tablet Take 220 mg by mouth.  . ondansetron (ZOFRAN-ODT) 8 MG disintegrating tablet ondansetron 8 mg disintegrating tablet  . SUMAtriptan (IMITREX) 100 MG tablet Take 1 tablet (100 mg total) by mouth every 2 (two) hours as needed for migraine or headache. May repeat in 2 hours if headache persists or recurs.  . valACYclovir (VALTREX) 1000 MG tablet TAKE ONE TABLET BY MOUTH TWICE DAILY  . [DISCONTINUED] chlorproMAZINE (THORAZINE) 10 MG tablet As needed for migraine headache  . [DISCONTINUED] doxycycline (VIBRA-TABS) 100 MG tablet Take 1 tablet (100 mg total) by mouth 2 (two) times daily.  . [DISCONTINUED] moxifloxacin (VIGAMOX) 0.5 % ophthalmic solution   . [DISCONTINUED] predniSONE (DELTASONE) 10 MG tablet 3 tabs by mouth per day for 3 days,2tabs per day for 3 days,1tab per day for 3 days   No facility-administered encounter medications on file as of 09/08/2020.    Allergies as of 09/08/2020 - Review Complete 09/08/2020  Allergen Reaction Noted  . Statins Other (See Comments) 02/25/2017    Past Medical History:  Diagnosis Date  . Allergy   . Anxiety   . ANXIETY 03/22/2007   Qualifier: Diagnosis of  By: Jenny Reichmann MD, Hunt Oris   . Asthma    as a child  . DJD (degenerative joint disease)   . GERD (gastroesophageal reflux disease)    in the past  . Glaucoma left  eye  . Hx of colonic polyps   . Hyperlipidemia   . Insomnia   . Lower back pain   . Migraines    treated by [facility]  . Myofascial pain syndrome   . Prostatitis   . Psoriatic arthritis ([place]) 03/04/2015  . Restless leg syndrome 08/25/2012    Past Surgical History:  Procedure Laterality Date  . ABDOMINAL EXPOSURE N/A 07/01/2017   Procedure: ABDOMINAL EXPOSURE;  Surgeon: Rosetta Posner, MD;  Location: [facility] OR;  Service: Vascular;  Laterality: N/A;  . ANTERIOR LUMBAR FUSION N/A 07/01/2017   Procedure: Lumbar Five-Sacral  one Anterior lumbar interbody fusion;  Surgeon: Kristeen Miss, MD;  Location: [facility];  Service: Neurosurgery;  Laterality: N/A;  . APPENDECTOMY  1967  . bilateral inguinal herniorrhaphy     1960's  . bilateral shoulder surgery  2006, 2007,2010  . COLONOSCOPY    . ganglion cyst surgery  2009  . HERNIA REPAIR  09/30/11   bilateral inguinal hernia  . knee surgery x 2  2004,2008  . LUMBAR SPINE SURGERY  07/01/2017   Lumbar Five-Sacral one Anterior lumbar interbody fusion (N/A Spine Lumbar)  . UPPER GASTROINTESTINAL ENDOSCOPY      Family History  Problem Relation Age of Onset  . Colon cancer Mother 55  . COPD Father   . Coronary artery disease Other   . Hyperlipidemia Other   . Hypertension Other   . Cancer Maternal Aunt        breast  . Cancer Maternal Uncle        prostate  . Colon cancer Maternal Uncle   . Colon polyps Sister   . Esophageal cancer Neg Hx   . Rectal cancer Neg Hx   . Stomach cancer Neg Hx     Social History   Socioeconomic History  . Marital status: Married    Spouse name: [place]  . Number of children: 1  . Years of education: 33  . Highest education level: Not on file  Occupational History  . 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 Smoking status: Never Smoker  . Smokeless tobacco: Never Used  Vaping Use  . Vaping Use: Never used  Substance and Sexual Activity  . Alcohol use: No  . Drug use: No  . Sexual activity: Not on file  Other Topics Concern  . 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  Respiratory: Positive for cough and shortness of breath.     Vitals:   09/08/20 0851  BP: 120/82  Pulse: (!) 117  Temp: 97.8 F (36.6 C)  SpO2: 95%     Physical Exam Constitutional:      Appearance: Normal appearance. He is normal weight.  HENT:     Nose: No congestion.     Mouth/Throat:     Mouth: Mucous membranes are moist.  Eyes:     General:        Right eye: No discharge.        Left eye: No discharge.     Pupils: Pupils are equal, round, and reactive to light.  Cardiovascular:     Rate and Rhythm: Normal rate and regular rhythm.     Heart sounds: No murmur heard. No friction rub.  Pulmonary:     Effort: No respiratory distress.     Breath sounds: No stridor. No wheezing or rhonchi.  Musculoskeletal:     Cervical back: No rigidity or tenderness.  Psychiatric:        Mood and Affect: Mood normal.   Data Reviewed: Chest x-ray 08/28/2020 reviewed by myself showing no acute infiltrate  Assessment:  Post Covid syndrome  Multiple URIs Recurrent bronchitis -Multiple antibiotics  History of asthma -Does not appear exacerbated at the present time  Discussion: His Covid infection may have led to increased hyperactivity of his airway Clinically better at the present time Most recent chest x-ray is reassuring-no infiltrative process I did discuss possibility of obtaining a CT scan of the chest, bronchoscopy, PFT if he does not continue to improve  Plan/Recommendations: Follow-up as needed  Symptomatic management of symptoms  Encouraged to call with any concerns  Follow-up with primary care doctor, encouraged that he may go back to work as he continues to improve   Sherrilyn Rist MD [facility] 09/08/2020, 9:05 AM  CC: Marrian Salvage,*

## 2020-09-08 NOTE — Patient Instructions (Signed)
Post Covid syndrome Multiple URIs recently  As things are better at present, I do not believe we need to do any further investigation at present The most recent chest x-ray was reassuring-no abnormality -As we discussed, a CT scan of the chest, breathing study, bronchoscopy are all options that can be considered if things are not continuing to improve  Continue usual activities  I will be glad to see you as needed Call with any significant concerns

## 2020-09-10 ENCOUNTER — Telehealth: Payer: Self-pay | Admitting: Pulmonary Disease

## 2020-09-12 ENCOUNTER — Other Ambulatory Visit: Payer: Self-pay

## 2020-09-12 ENCOUNTER — Encounter: Payer: Self-pay | Admitting: Internal Medicine

## 2020-09-12 ENCOUNTER — Ambulatory Visit (INDEPENDENT_AMBULATORY_CARE_PROVIDER_SITE_OTHER): Payer: 59 | Admitting: Internal Medicine

## 2020-09-12 VITALS — BP 130/82 | HR 87 | Temp 98.6°F | Ht 69.0 in | Wt 164.6 lb

## 2020-09-12 DIAGNOSIS — J069 Acute upper respiratory infection, unspecified: Secondary | ICD-10-CM

## 2020-09-12 DIAGNOSIS — S32010A Wedge compression fracture of first lumbar vertebra, initial encounter for closed fracture: Secondary | ICD-10-CM | POA: Insufficient documentation

## 2020-09-12 DIAGNOSIS — F419 Anxiety disorder, unspecified: Secondary | ICD-10-CM

## 2020-09-12 DIAGNOSIS — U099 Post covid-19 condition, unspecified: Secondary | ICD-10-CM

## 2020-09-12 DIAGNOSIS — S22089A Unspecified fracture of T11-T12 vertebra, initial encounter for closed fracture: Secondary | ICD-10-CM | POA: Insufficient documentation

## 2020-09-12 NOTE — Patient Instructions (Signed)
Your form was filled out today for return to work  Please continue all other medications as before, and refills have been done if requested.  Please have the pharmacy call with any other refills you may need.  Please continue your efforts at being more active, low cholesterol diet, and weight control.  Please keep your appointments with your specialists as you may have planned

## 2020-09-12 NOTE — Telephone Encounter (Signed)
Rec'd signed form back - Sent email to Kathlee Nations to drop charge -pr

## 2020-09-12 NOTE — Progress Notes (Signed)
Patient ID: Randy Moran, male   DOB: May 13, 1961, 60 y.o.   MRN: [id number]        Chief Complaint: follow up post covid       HPI:  Randy Moran is a 60 y.o. male here with above, not sure himself if he seems ready to go back to work.  Cough and congestion essentially resolved, HA improved, but tinnitus and fatigue still persist.  Has some element of brain fog with some hard to concentrate with confused and disoriented at times.  Saw pulmonary - benign exam.  Due for work next wk on call.  Last day worked feb 14.  Pt denies chest pain, increased sob or doe, wheezing, orthopnea, PND, increased LE swelling, palpitations, dizziness or syncope.   Pt denies polydipsia, polyuria, Denies new focal neuro s/s.   Pt denies fever, wt loss, night sweats, loss of appetite, or other constitutional symptoms Wt Readings from Last 3 Encounters:  09/12/20 164 lb 9.6 oz (74.7 kg)  09/08/20 161 lb 4 oz (73.1 kg)  08/28/20 161 lb (73 kg)   BP Readings from Last 3 Encounters:  09/12/20 130/82  09/08/20 120/82  08/28/20 (!) 140/92         Past Medical History:  Diagnosis Date  . Allergy   . Anxiety   . ANXIETY 03/22/2007   Qualifier: Diagnosis of  By: Jenny Reichmann MD, Hunt Oris   . Asthma    as a child  . DJD (degenerative joint disease)   . GERD (gastroesophageal reflux disease)    in the past  . Glaucoma left eye  . Hx of colonic polyps   . Hyperlipidemia   . Insomnia   . Lower back pain   . Migraines    treated by [facility]  . Myofascial pain syndrome   . Prostatitis   . Psoriatic arthritis ([place]) 03/04/2015  . Restless leg syndrome 08/25/2012   Past Surgical History:  Procedure Laterality Date  . ABDOMINAL EXPOSURE N/A 07/01/2017   Procedure: ABDOMINAL EXPOSURE;  Surgeon: Rosetta Posner, MD;  Location: [facility] OR;  Service: Vascular;  Laterality: N/A;  . ANTERIOR LUMBAR FUSION N/A 07/01/2017   Procedure: Lumbar Five-Sacral one Anterior lumbar interbody fusion;  Surgeon: Kristeen Miss, MD;  Location:  [facility];  Service: Neurosurgery;  Laterality: N/A;  . APPENDECTOMY  1967  . bilateral inguinal herniorrhaphy     1960's  . bilateral shoulder surgery  2006, 2007,2010  . COLONOSCOPY    . ganglion cyst surgery  2009  . HERNIA REPAIR  09/30/11   bilateral inguinal hernia  . knee surgery x 2  2004,2008  . LUMBAR SPINE SURGERY  07/01/2017   Lumbar Five-Sacral one Anterior lumbar interbody fusion (N/A Spine Lumbar)  . UPPER GASTROINTESTINAL ENDOSCOPY      reports that he has never smoked. He has never used smokeless tobacco. He reports that he does not drink alcohol and does not use drugs. family history includes COPD in his father; Cancer in his maternal aunt and maternal uncle; Colon cancer in his maternal uncle; Colon cancer (age of onset: 58) in his mother; Colon polyps in his sister; Coronary artery disease in an other family member; Hyperlipidemia in an other family member; Hypertension in an other family member. Allergies  Allergen Reactions  . Statins Other (See Comments)    Fatigue and weakness   Current Outpatient Medications on File Prior to Visit  Medication Sig Dispense Refill  . acetaminophen (TYLENOL) 500 MG tablet Take 500-1,000  mg by mouth every 6 (six) hours as needed for mild pain, moderate pain or headache.     . clobetasol cream (TEMOVATE) 8.18 % 1 application to affected area Externally Twice a day as needed 30 days    . HUMIRA PEN 40 MG/0.8ML PNKT Inject 40 mg into the muscle every 14 (fourteen) days.     Marland Kitchen ibuprofen (ADVIL) 800 MG tablet Take 1 tablet (800 mg total) by mouth every 8 (eight) hours as needed. 30 tablet 0  . latanoprost (XALATAN) 0.005 % ophthalmic solution Place 1 drop into both eyes at bedtime.     . naproxen sodium (ALEVE) 220 MG tablet Take 220 mg by mouth.    . ondansetron (ZOFRAN-ODT) 8 MG disintegrating tablet ondansetron 8 mg disintegrating tablet    . SUMAtriptan (IMITREX) 100 MG tablet Take 1 tablet (100 mg total) by mouth every 2 (two) hours as  needed for migraine or headache. May repeat in 2 hours if headache persists or recurs. 10 tablet 5  . valACYclovir (VALTREX) 1000 MG tablet TAKE ONE TABLET BY MOUTH TWICE DAILY 20 tablet 0  . amoxicillin-clavulanate (AUGMENTIN) 875-125 MG tablet Take 1 tablet by mouth 2 (two) times daily. (Patient not taking: Reported on 09/12/2020) 20 tablet 0  . chlorpheniramine-HYDROcodone (TUSSIONEX PENNKINETIC ER) 10-8 MG/5ML SUER Take 5 mLs by mouth every 12 (twelve) hours as needed for cough. (Patient not taking: Reported on 09/12/2020) 140 mL 0  . guaiFENesin-codeine 100-10 MG/5ML syrup Take 5 mLs by mouth every 6 (six) hours as needed for cough. (Patient not taking: Reported on 09/12/2020) 180 mL 0  . [DISCONTINUED] chlorproMAZINE (THORAZINE) 10 MG tablet As needed for migraine headache     No current facility-administered medications on file prior to visit.        ROS:  All others reviewed and negative.  Objective        PE:  BP 130/82 (BP Location: Left Arm, Patient Position: Sitting, Cuff Size: Large)   Pulse 87   Temp 98.6 F (37 C) (Oral)   Ht 5\' 9"  (1.753 m)   Wt 164 lb 9.6 oz (74.7 kg)   SpO2 99%   BMI 24.31 kg/m                 Constitutional: Pt appears in NAD               HENT: Head: NCAT.                Right Ear: External ear normal.                 Left Ear: External ear normal.                Eyes: . Pupils are equal, round, and reactive to light. Conjunctivae and EOM are normal               Nose: without d/c or deformity               Neck: Neck supple. Gross normal ROM               Cardiovascular: Normal rate and regular rhythm.                 Pulmonary/Chest: Effort normal and breath sounds without rales or wheezing.                Abd:  Soft, NT, ND, + BS, no organomegaly  Neurological: Pt is alert. At baseline orientation, motor grossly intact               Skin: Skin is warm. No rashes, no other new lesions, LE edema - none               Psychiatric: Pt  behavior is normal without agitation   Micro: none  Cardiac tracings I have personally interpreted today:  none  Pertinent Radiological findings (summarize): none   Lab Results  Component Value Date   WBC 5.2 07/17/2020   HGB 14.7 07/17/2020   HCT 42.4 07/17/2020   PLT 209.0 07/17/2020   GLUCOSE 89 07/17/2020   CHOL 259 (H) 05/16/2020   TRIG 131.0 05/16/2020   HDL 64.60 05/16/2020   LDLDIRECT 135.3 08/24/2012   LDLCALC 168 (H) 05/16/2020   ALT 24 07/17/2020   AST 19 07/17/2020   NA 139 07/17/2020   K 3.8 07/17/2020   CL 104 07/17/2020   CREATININE 0.90 07/17/2020   BUN 11 07/17/2020   CO2 30 07/17/2020   TSH 2.21 05/16/2020   PSA 1.1 08/24/2019   Assessment/Plan:  Randy Moran is a 60 y.o. White or Caucasian [1] male with  has a past medical history of Allergy, Anxiety, ANXIETY (03/22/2007), Asthma, DJD (degenerative joint disease), GERD (gastroesophageal reflux disease), Glaucoma (left eye), colonic polyps, Hyperlipidemia, Insomnia, Lower back pain, Migraines, Myofascial pain syndrome, Prostatitis, Psoriatic arthritis ([place]) (03/04/2015), and Restless leg syndrome (08/25/2012).  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  Cathlean Cower, MD 09/13/2020 10:57 PM [facility]

## 2020-09-13 ENCOUNTER — Encounter: Payer: Self-pay | Admitting: Internal Medicine

## 2020-09-13 NOTE — Assessment & Plan Note (Signed)
Reassured,  to f/u any worsening symptoms or concerns, cont same tx

## 2020-09-13 NOTE — Assessment & Plan Note (Signed)
Resolved,  to f/u any worsening symptoms or concerns  

## 2020-09-13 NOTE — Assessment & Plan Note (Signed)
Overall improved in the past 1-2 wks, ok for return to work Sep 15, 2020 with work note

## 2020-09-16 DIAGNOSIS — Z0289 Encounter for other administrative examinations: Secondary | ICD-10-CM

## 2020-09-17 NOTE — Telephone Encounter (Signed)
Followed up with Kathlee Nations regarding charge -pr

## 2020-09-22 NOTE — Telephone Encounter (Signed)
Called and left message on pt vm advising fee has been dropped and we can collect payment over the phone, pt can come in for payment, or pay electronically via mychart. -pr

## 2020-09-23 NOTE — Telephone Encounter (Signed)
Called and l/m advised that we will scan form to his chart - and I will ask Kathlee Nations to contact billing to remove $29 charge -pr

## 2020-09-30 ENCOUNTER — Telehealth: Payer: Self-pay | Admitting: Pulmonary Disease

## 2020-10-02 NOTE — Telephone Encounter (Signed)
Patient is aware that $29 fee - billing will be correcting the charge. -pr

## 2020-10-20 ENCOUNTER — Telehealth: Payer: Self-pay | Admitting: Internal Medicine

## 2020-10-20 NOTE — Telephone Encounter (Signed)
Patients wife calling, wondering if her husband can do a transfer of care from Dr. Jenny Reichmann to Dr. Quay Burow. Made her aware Dr. Quay Burow is not currently taking on new patients and she still wanted me to ask. Just let me know, thank you.

## 2020-10-20 NOTE — Telephone Encounter (Signed)
Ok with me 

## 2020-10-22 NOTE — Telephone Encounter (Signed)
Yes, I can accept him.

## 2020-11-17 NOTE — Patient Instructions (Addendum)
    Medications changes include :   Restart Emgality   Your prescription(s) have been submitted to your pharmacy. Please take as directed and contact our office if you believe you are having problem(s) with the medication(s).     Please followup in about 1 year for a physical

## 2020-11-17 NOTE — Progress Notes (Signed)
Subjective:    Patient ID: Randy Moran, male    DOB: June 17, 1960, 60 y.o.   MRN: [id number]  HPI He is here to establish with a new physician. The patient is here for follow up of their chronic medical problems, including psoriatic arthritis, migraines,   He has no major concerns.  He does work shift work and that does cause significant fatigue and affects his lifestyle tremendously.  The shiftwork also increases his migraines    Medications and allergies reviewed with patient and updated if appropriate.  Patient Active Problem List   Diagnosis Date Noted  . Compression fracture of L1 lumbar vertebra ([place]) 09/12/2020  . Fracture of twelfth thoracic vertebra ([place]) 09/12/2020  . 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 Cough 06/12/2020  . Other chronic pain 02/26/2020  . Wedge compression fracture of T11-T12 vertebra, initial encounter for closed fracture ([place]) 02/26/2020  . Pain in lower limb 08/24/2019  . Primary osteoarthritis of right hip 12/13/2018  . Concentration deficit 10/17/2018  . Allergic rhinitis 09/13/2018  . Family history of colon cancer in mother - 43's 05/17/2018  . Fatigue 03/06/2018  . Swelling of both lower extremities 09/08/2017  . Numbness of foot 07/12/2017  . 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 Anxiety 08/06/2016  . Tarsal tunnel syndrome of right side 07/30/2016  . Other specified arthritis, right shoulder 03/25/2016  . 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 Psoriatic arthritis ([place]) - [facility] 03/04/2015  . GERD (gastroesophageal reflux disease) 03/04/2015  . Migraine  09/28/2013  . Glaucoma - managed by [facility] 09/28/2013  . Degeneration of lumbosacral intervertebral disc 03/23/2013  . Displacement of lumbar intervertebral disc without myelopathy 03/23/2013  . Restless legs syndrome (RLS) 12/01/2011  . DEGENERATIVE JOINT DISEASE - Managed by Dr. Nelva Bush at [place] 04/07/2008   . Hyperlipidemia 03/22/2007  . LOW BACK PAIN 03/22/2007    Current Outpatient Medications on File Prior to Visit  Medication Sig Dispense Refill  . acetaminophen (TYLENOL) 500 MG tablet Take 500-1,000 mg by mouth every 6 (six) hours as needed for mild pain, moderate pain or headache.     Marland Kitchen HUMIRA PEN 40 MG/0.8ML PNKT Inject 40 mg into the muscle every 14 (fourteen) days.     Marland Kitchen ibuprofen (ADVIL) 800 MG tablet Take 1 tablet (800 mg total) by mouth every 8 (eight) hours as needed. 30 tablet 0  . latanoprost (XALATAN) 0.005 % ophthalmic solution Place 1 drop into both eyes at bedtime.     . naproxen sodium (ALEVE) 220 MG tablet Take 220 mg by mouth.    . ondansetron (ZOFRAN-ODT) 8 MG disintegrating tablet ondansetron 8 mg disintegrating tablet    . SUMAtriptan (IMITREX) 100 MG tablet Take 1 tablet (100 mg total) by mouth every 2 (two) hours as needed for migraine or headache. May repeat in 2 hours if headache persists or recurs. 10 tablet 5  . valACYclovir (VALTREX) 1000 MG tablet TAKE ONE TABLET BY MOUTH TWICE DAILY 20 tablet 0  . imiquimod (ALDARA) 5 % cream Apply topically 2 (two) times daily.    . [DISCONTINUED] chlorproMAZINE (THORAZINE) 10 MG tablet As needed for migraine headache     No current facility-administered medications on file prior to visit.    Past Medical History:  Diagnosis Date  . Allergy   . Anxiety   . ANXIETY 03/22/2007   Qualifier: Diagnosis of  By: Jenny Reichmann MD, Hunt Oris   . Asthma    as  a child  . DJD (degenerative joint disease)   . GERD (gastroesophageal reflux disease)    in the past  . Glaucoma left eye  . Hx of colonic polyps   . Hyperlipidemia   . Insomnia   . Lower back pain   . Migraines    treated by [facility]  . Myofascial pain syndrome   . Prostatitis   . Psoriatic arthritis ([place]) 03/04/2015  . Restless leg syndrome 08/25/2012    Past Surgical History:  Procedure Laterality Date  . ABDOMINAL EXPOSURE N/A 07/01/2017   Procedure: ABDOMINAL  EXPOSURE;  Surgeon: Rosetta Posner, MD;  Location: [facility];  Service: Vascular;  Laterality: N/A;  . ANTERIOR LUMBAR FUSION N/A 07/01/2017   Procedure: Lumbar Five-Sacral one Anterior lumbar interbody fusion;  Surgeon: Kristeen Miss, MD;  Location: [place];  Service: Neurosurgery;  Laterality: N/A;  . APPENDECTOMY  1967  . bilateral inguinal herniorrhaphy     1960's  . bilateral shoulder surgery  2006, 2007,2010  . COLONOSCOPY    . ganglion cyst surgery  2009  . HERNIA REPAIR  09/30/11   bilateral inguinal hernia  . knee surgery x 2  2004,2008  . LUMBAR SPINE SURGERY  07/01/2017   Lumbar Five-Sacral one Anterior lumbar interbody fusion (N/A Spine Lumbar)  . UPPER GASTROINTESTINAL ENDOSCOPY      Social History   Socioeconomic History  . Marital status: Married    Spouse name: [place]  . Number of children: 1  . Years of education: 63  . Highest education level: Not on file  Occupational History  . 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 Smoking status: Never Smoker  . Smokeless tobacco: Never Used  Vaping Use  . Vaping Use: Never used  Substance and Sexual Activity  . Alcohol use: No  . Drug use: No  . Sexual activity: Not on file  Other Topics Concern  . 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 Colon cancer Mother 62  . COPD Father   . Coronary artery disease Other   . Hyperlipidemia Other   .  Hypertension Other   . Cancer Maternal Aunt        breast  . Cancer Maternal Uncle        prostate  . Colon cancer Maternal Uncle   . Colon polyps Sister   . Esophageal cancer Neg Hx   . Rectal cancer Neg Hx   . Stomach cancer Neg Hx     Review of Systems  Constitutional: Negative for fever.  Respiratory: Negative for cough, shortness of breath and wheezing.   Cardiovascular: Negative for chest pain, palpitations and leg swelling.  Gastrointestinal: Negative for abdominal pain and nausea.  Gerd  Neurological: Positive for headaches (migraines). Negative for light-headedness.  Psychiatric/Behavioral: Negative for dysphoric mood. The patient is not nervous/anxious.        Objective:   Vitals:   11/18/20 0834  BP: 126/74  Pulse: 83  Temp: 98.3 F (36.8 C)  SpO2: 99%   BP Readings from Last 3 Encounters:  11/18/20 126/74  09/12/20 130/82  09/08/20 120/82   Wt Readings from Last 3 Encounters:  11/18/20 164 lb (74.4 kg)  09/12/20 164 lb 9.6 oz (74.7 kg)  09/08/20 161 lb 4 oz (73.1 kg)   Body mass index is 24.22 kg/m.   Physical Exam    Constitutional: Appears well-developed and well-nourished. No distress.  HENT:  Head: Normocephalic and atraumatic.  Neck: Neck supple. No tracheal deviation present. No thyromegaly present.  No cervical lymphadenopathy Cardiovascular: Normal rate, regular rhythm and normal heart sounds.   No murmur heard. No carotid bruit .  No edema Pulmonary/Chest: Effort normal and breath sounds normal. No respiratory distress. No has no wheezes. No rales.  Skin: Skin is warm and dry. Not diaphoretic.  Psychiatric: Normal mood and affect. Behavior is normal.      Assessment & Plan:    See Problem List for Assessment and Plan of chronic medical problems.    This visit occurred during the SARS-CoV-2 public health emergency.  Safety protocols were in place, including screening questions prior to the visit, additional usage of staff  PPE, and extensive cleaning of exam room while observing appropriate contact time as indicated for disinfecting solutions.

## 2020-11-18 ENCOUNTER — Encounter: Payer: Self-pay | Admitting: Internal Medicine

## 2020-11-18 ENCOUNTER — Other Ambulatory Visit: Payer: Self-pay

## 2020-11-18 ENCOUNTER — Ambulatory Visit (INDEPENDENT_AMBULATORY_CARE_PROVIDER_SITE_OTHER): Payer: 59 | Admitting: Internal Medicine

## 2020-11-18 DIAGNOSIS — R059 Cough, unspecified: Secondary | ICD-10-CM | POA: Diagnosis not present

## 2020-11-18 DIAGNOSIS — G43809 Other migraine, not intractable, without status migrainosus: Secondary | ICD-10-CM

## 2020-11-18 DIAGNOSIS — L4050 Arthropathic psoriasis, unspecified: Secondary | ICD-10-CM | POA: Diagnosis not present

## 2020-11-18 MED ORDER — EMGALITY 120 MG/ML ~~LOC~~ SOAJ: 120.0000 mg | SUBCUTANEOUS | 11 refills | Status: DC

## 2020-11-18 NOTE — Assessment & Plan Note (Signed)
Post COVID Finally has resolved

## 2020-11-18 NOTE — Assessment & Plan Note (Addendum)
Chronic Typically no aura, occasionally will occ slurred speech Occurs at least 1-2 times a week-unfortunately increased because of lifestyle-working rotating shift work [employer] imitrex 100 mg as needed - effective - gets a little drowsiness with it Was on emgality in the past - worked well-this was stopped because his neurologist moved out of town Will restart Emgality-240 mg subcu x1 and then 120 mg subcu monthly Continue Imitrex 100 mg as needed

## 2020-11-18 NOTE — Assessment & Plan Note (Addendum)
Chronic Goes to [facility] On Humira Controlled

## 2020-12-02 NOTE — Progress Notes (Signed)
Virtual Visit via Video Note  I connected with Randy Moran on 12/02/20 at  8:30 AM EDT by a video enabled telemedicine application and verified that I am speaking with the correct person using two identifiers.   I discussed the limitations of evaluation and management by telemedicine and the availability of in person appointments. The patient expressed understanding and agreed to proceed.  Present for the visit:  Myself, Dr Billey Gosling, Maudie Flakes.  The patient is currently at home and I am in the office.    No referring provider.    History of Present Illness: This is an acute visit for cold symptoms.  His symptoms started after vacation last week with family.  It spread throughout the family.  His grandchildren, daughter, son-in-law and wife all got sick.  His symptoms started 4 days ago with a sore throat.  He developed lightheadedness, dizziness and had a fever of over 101 2 days ago.  He has developed chills, sweats, nasal congestion with thick dark yellow mucus, ear pain, sinus pain and he continues to have a sore throat.  He does have a cough and occasionally brings up some mucus, but it seems like it is more in the throat or upper chest area.  He does not feel like the infection is into his lungs.  He states myalgias and headaches.  He is taking tylenol.     Covid test negative.   His family's covid tests have all been negative.  All of his family members are on antibiotics.  His son-in-law has pneumonia and his grand child has double ear infection.  Oxygen level in mid-upper 90's.   Review of Systems  Constitutional:  Positive for chills, diaphoresis and fever (> 101).  HENT:  Positive for congestion (thick, dark yellow mucus), ear pain, sinus pain and sore throat.   Respiratory:  Positive for cough and sputum production. Negative for shortness of breath and wheezing.   Musculoskeletal:  Positive for myalgias.  Neurological:  Positive for dizziness and headaches.      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  Follow Up Instructions:    I discussed the assessment and treatment plan with the patient. The patient was provided an opportunity to ask questions and all were answered. The patient agreed with the plan and demonstrated an understanding of the instructions.   The patient was advised to call back or seek an in-person evaluation if the symptoms worsen or if the condition fails to improve as anticipated.    Binnie Rail, MD

## 2020-12-03 ENCOUNTER — Encounter: Payer: Self-pay | Admitting: Internal Medicine

## 2020-12-03 ENCOUNTER — Telehealth (INDEPENDENT_AMBULATORY_CARE_PROVIDER_SITE_OTHER): Payer: 59 | Admitting: Internal Medicine

## 2020-12-03 DIAGNOSIS — J019 Acute sinusitis, unspecified: Secondary | ICD-10-CM

## 2020-12-03 MED ORDER — HYDROCOD POLST-CPM POLST ER 10-8 MG/5ML PO SUER: 5.0000 mL | Freq: Two times a day (BID) | ORAL | 0 refills | Status: DC | PRN

## 2020-12-03 MED ORDER — AMOXICILLIN-POT CLAVULANATE 875-125 MG PO TABS: 1.0000 | ORAL_TABLET | Freq: Two times a day (BID) | ORAL | 0 refills | Status: DC

## 2020-12-03 NOTE — Assessment & Plan Note (Signed)
Acute Likely bacterial  Start Augmentin 875-125 mg BID x 10 day Tussionex cough syrup twice daily as needed otc cold medications, continue Tylenol Can take Sudafed for ear pain/pressure Rest, fluid Call if no improvement

## 2020-12-23 ENCOUNTER — Telehealth: Payer: Self-pay | Admitting: Internal Medicine

## 2020-12-23 NOTE — Telephone Encounter (Signed)
Spoke with wife today. She will keep an eye out and make appointment for patient if he gets worse.

## 2020-12-23 NOTE — Telephone Encounter (Signed)
   Spouse calling to request samples of Galcanezumab-gnlm (EMGALITY) 120 MG/ML SOAJ

## 2020-12-23 NOTE — Telephone Encounter (Signed)
   Spouse seeking advice for patient, knot on right side of throat, sore throat Declined virtual visit  Offered patient appointment for 7/13, denied , "states he has to work"

## 2020-12-23 NOTE — Telephone Encounter (Signed)
Spoke with patient's wife today. We are currently out of samples at the moment.

## 2020-12-24 ENCOUNTER — Ambulatory Visit: Payer: 59 | Admitting: Internal Medicine

## 2020-12-29 ENCOUNTER — Ambulatory Visit (INDEPENDENT_AMBULATORY_CARE_PROVIDER_SITE_OTHER): Payer: 59 | Admitting: Otolaryngology

## 2020-12-30 NOTE — Telephone Encounter (Signed)
Spoke with patient's wife. Sample ready for pickup in fridge (bottom shelf) in old lab area refrigerator. Placed in purple bag with patient's name on it.

## 2020-12-31 NOTE — Telephone Encounter (Signed)
   Spouse calling to request prior auth  for [employer]

## 2021-01-01 NOTE — Telephone Encounter (Signed)
IMOTHY Mitchelle (Key: [id number])  Your information has been submitted to [facility]. To check for an updated outcome later, reopen this PA request from your dashboard.  If [employer] has not responded to your request within 24 hours, contact [place] at [phone]. If you think there may be a problem with your PA request, use our live chat feature at the bottom right.

## 2021-01-01 NOTE — Telephone Encounter (Signed)
Patient informed of approval today.

## 2021-01-01 NOTE — Telephone Encounter (Signed)
Nile Constancio KeyEvangeline Dakin - PA Case ID: [id number] Need help? Call us at [phone] Outcome Approvedtoday Your PA request has been approved. Additional information will be provided in the approval communication. (Message 1145) Drug Emgality 120MG /ML auto-injectors (migraine) Form [employer] Electronic PA Form [phone] NCPDP)

## 2021-01-12 ENCOUNTER — Ambulatory Visit: Payer: 59 | Admitting: Physician Assistant

## 2021-03-21 ENCOUNTER — Ambulatory Visit (INDEPENDENT_AMBULATORY_CARE_PROVIDER_SITE_OTHER): Payer: 59

## 2021-03-21 DIAGNOSIS — Z23 Encounter for immunization: Secondary | ICD-10-CM

## 2021-06-16 ENCOUNTER — Telehealth (INDEPENDENT_AMBULATORY_CARE_PROVIDER_SITE_OTHER): Payer: 59 | Admitting: Family Medicine

## 2021-06-16 ENCOUNTER — Encounter: Payer: Self-pay | Admitting: Family Medicine

## 2021-06-16 DIAGNOSIS — R0981 Nasal congestion: Secondary | ICD-10-CM | POA: Diagnosis not present

## 2021-06-16 MED ORDER — DOXYCYCLINE HYCLATE 100 MG PO TABS: 100.0000 mg | ORAL_TABLET | Freq: Two times a day (BID) | ORAL | 0 refills | Status: DC

## 2021-06-16 MED ORDER — BENZONATATE 100 MG PO CAPS: ORAL_CAPSULE | ORAL | 0 refills | Status: DC

## 2021-06-16 NOTE — Progress Notes (Signed)
Virtual Visit via Video Note  I connected with Randy Moran  on 06/16/21 at  4:40 PM EST by a video enabled telemedicine application and verified that I am speaking with the correct person using two identifiers.  Location patient: [place] Location provider:work or home office Persons participating in the virtual visit: patient, provider  I discussed the limitations and requested verbal permission for telemedicine visit. The patient expressed understanding and agreed to proceed.   HPI:  Acute telemedicine visit for sinus issues: -Onset: has felt like has had a sinus infection for several weeks to 1 month -Symptoms include: sinus discomfort, occ feels a little dizzy (like with prior sinus infections), felt worse the last 4-5 days, sinus pressure, L ear discomfort, sore throat, lots of yellow sinus congestion, low grade temps, chills -has had 3 negative covid tests this past week -Denies: CP, SOB, NVD, inability to eat/drink, severe headache -has surgery coming up and feels needs abx -Pertinent past medical history: see below, reports hx of sinusitis -Pertinent medication allergies: Allergies  Allergen Reactions   Statins Other (See Comments)    Fatigue and weakness   -COVID-19 vaccine status:  Immunization History  Administered Date(s) Administered   Influenza Split 02/25/2012, 02/19/2013   Influenza Whole 03/06/2009, 02/12/2010, 04/19/2011   Influenza,inj,Quad PF,6+ Mos 03/04/2015, 02/20/2016, 02/25/2017, 03/06/2018, 04/05/2019, 03/21/2021   Influenza-Unspecified 03/22/2014, 04/05/2019   PFIZER(Purple Top)SARS-COV-2 Vaccination 09/10/2019, 10/03/2019   Td 04/18/2009   Zoster Recombinat (Shingrix) 02/01/2020, 05/13/2020     ROS: See pertinent positives and negatives per HPI.  Past Medical History:  Diagnosis Date   Allergy    Anxiety    ANXIETY 03/22/2007   Qualifier: Diagnosis of  By: Jenny Reichmann MD, Hunt Oris    Asthma    as a child   DJD (degenerative joint disease)    GERD  (gastroesophageal reflux disease)    in the past   Glaucoma left eye   Hx of colonic polyps    Hyperlipidemia    Insomnia    Lower back pain    Migraines    treated by [facility]   Myofascial pain syndrome    Prostatitis    Psoriatic arthritis ([place]) 03/04/2015   Restless leg syndrome 08/25/2012    Past Surgical History:  Procedure Laterality Date   ABDOMINAL EXPOSURE N/A 07/01/2017   Procedure: ABDOMINAL EXPOSURE;  Surgeon: Rosetta Posner, MD;  Location: [facility] OR;  Service: Vascular;  Laterality: N/A;   ANTERIOR LUMBAR FUSION N/A 07/01/2017   Procedure: Lumbar Five-Sacral one Anterior lumbar interbody fusion;  Surgeon: Kristeen Miss, MD;  Location: [place];  Service: Neurosurgery;  Laterality: N/A;   APPENDECTOMY  1967   bilateral inguinal herniorrhaphy     1960's   bilateral shoulder surgery  2006, 2007,2010   COLONOSCOPY     ganglion cyst surgery  2009   HERNIA REPAIR  09/30/11   bilateral inguinal hernia   knee surgery x 2  2004,2008   LUMBAR SPINE SURGERY  07/01/2017   Lumbar Five-Sacral one Anterior lumbar interbody fusion (N/A Spine Lumbar)   UPPER GASTROINTESTINAL ENDOSCOPY       Current Outpatient Medications:    acetaminophen (TYLENOL) 500 MG tablet, Take 500-1,000 mg by mouth every 6 (six) hours as needed for mild pain, moderate pain or headache. , Disp: , Rfl:    benzonatate (TESSALON PERLES) 100 MG capsule, 1-2 capsules up to twice daily as needed for cough., Disp: 30 capsule, Rfl: 0   doxycycline (VIBRA-TABS) 100 MG tablet, Take 1 tablet (100  mg total) by mouth 2 (two) times daily., Disp: 20 tablet, Rfl: 0   Galcanezumab-gnlm (EMGALITY) 120 MG/ML SOAJ, Inject 120 mg into the skin every 30 (thirty) days., Disp: 1 mL, Rfl: 11   ibuprofen (ADVIL) 800 MG tablet, Take 1 tablet (800 mg total) by mouth every 8 (eight) hours as needed., Disp: 30 tablet, Rfl: 0   latanoprost (XALATAN) 0.005 % ophthalmic solution, Place 1 drop into both eyes at bedtime. , Disp: , Rfl:    naproxen  sodium (ALEVE) 220 MG tablet, Take 220 mg by mouth., Disp: , Rfl:    ondansetron (ZOFRAN-ODT) 8 MG disintegrating tablet, ondansetron 8 mg disintegrating tablet, Disp: , Rfl:    SUMAtriptan (IMITREX) 100 MG tablet, Take 1 tablet (100 mg total) by mouth every 2 (two) hours as needed for migraine or headache. May repeat in 2 hours if headache persists or recurs., Disp: 10 tablet, Rfl: 5   valACYclovir (VALTREX) 1000 MG tablet, TAKE ONE TABLET BY MOUTH TWICE DAILY, Disp: 20 tablet, Rfl: 0  EXAM:  VITALS per patient if applicable:  GENERAL: alert, oriented, appears well and in no acute distress  HEENT: atraumatic, conjunttiva clear, no obvious abnormalities on inspection of external nose and ears  NECK: normal movements of the head and neck  LUNGS: on inspection no signs of respiratory distress, breathing rate appears normal, no obvious gross SOB, gasping or wheezing  CV: no obvious cyanosis  MS: moves all visible extremities without noticeable abnormality  PSYCH/NEURO: pleasant and cooperative, no obvious depression or anxiety, speech and thought processing grossly intact  ASSESSMENT AND PLAN:  Discussed the following assessment and plan:  Nasal sinus congestion  -we discussed possible serious and likely etiologies, options for evaluation and workup, limitations of telemedicine visit vs in person visit, treatment, treatment risks and precautions. Pt is agreeable to treatment via telemedicine at this moment. Likely bacterial sinusitis vs other. He opted to try empiric tx with doxy 100mg  bid x 7-10 days and tessalon rx for cough.  Advised to seek prompt in person care if worsening, new symptoms arise, or if is not improving with treatment as expected per our conversation of expected course. Discussed options for follow up care. Did let this patient know that I do telemedicine on Tuesdays and Thursdays for [facility] and those are the days I am logged into the system.    I discussed the  assessment and treatment plan with the patient. The patient was provided an opportunity to ask questions and all were answered. The patient agreed with the plan and demonstrated an understanding of the instructions.     Lucretia Kern, DO

## 2021-06-16 NOTE — Patient Instructions (Signed)
-  I sent the medication(s) we discussed to your pharmacy: Meds ordered this encounter  Medications   doxycycline (VIBRA-TABS) 100 MG tablet    Sig: Take 1 tablet (100 mg total) by mouth 2 (two) times daily.    Dispense:  20 tablet    Refill:  0   benzonatate (TESSALON PERLES) 100 MG capsule    Sig: 1-2 capsules up to twice daily as needed for cough.    Dispense:  30 capsule    Refill:  0     I hope you are feeling better soon!  Seek in person care promptly if your symptoms worsen, new concerns arise or you are not improving with treatment.  It was nice to meet you today. I help [facility] out with telemedicine visits on Tuesdays and Thursdays and am happy to help if you need a virtual follow up visit on those days. Otherwise, if you have any concerns or questions following this visit please schedule a follow up visit with your Primary Care office or seek care at a local urgent care clinic to avoid delays in care

## 2021-07-06 ENCOUNTER — Other Ambulatory Visit: Payer: Self-pay | Admitting: Internal Medicine

## 2021-07-07 ENCOUNTER — Encounter: Payer: Self-pay | Admitting: Internal Medicine

## 2021-07-07 ENCOUNTER — Other Ambulatory Visit: Payer: Self-pay | Admitting: Internal Medicine

## 2021-10-06 ENCOUNTER — Encounter: Payer: Self-pay | Admitting: Internal Medicine

## 2021-10-06 DIAGNOSIS — E7849 Other hyperlipidemia: Secondary | ICD-10-CM

## 2021-10-06 DIAGNOSIS — Z0000 Encounter for general adult medical examination without abnormal findings: Secondary | ICD-10-CM

## 2021-10-06 DIAGNOSIS — Z125 Encounter for screening for malignant neoplasm of prostate: Secondary | ICD-10-CM

## 2021-10-06 NOTE — Progress Notes (Signed)
? ? ?Subjective:  ? ? Patient ID: Randy Moran, male    DOB: [birth year], 61 y.o.   MRN: [id number] ? ? ?This visit occurred during the SARS-CoV-2 public health emergency.  Safety protocols were in place, including screening questions prior to the visit, additional usage of staff PPE, and extensive cleaning of exam room while observing appropriate contact time as indicated for disinfecting solutions. ? ? ?HPI ?Randy Moran is here for  ?Chief Complaint  ?Patient presents with  ? Annual Exam  ? ? ?He retired two months ago.  Doing well.   No major concerns - trying to get back to a good sleep cycle and decreasing his caffeine.   ? ? ? ?Medications and allergies reviewed with patient and updated if appropriate. ? ? ?Current Outpatient Medications on File Prior to Visit  ?Medication Sig Dispense Refill  ? acetaminophen (TYLENOL) 500 MG tablet Take 500-1,000 mg by mouth every 6 (six) hours as needed for mild pain, moderate pain or headache.     ? Galcanezumab-gnlm (EMGALITY) 120 MG/ML SOAJ Inject 120 mg into the skin every 30 (thirty) days. 1 mL 11  ? HUMIRA PEN 40 MG/0.4ML PNKT SMARTSIG:40 Milligram(s) SUB-Q Every 2 Weeks    ? ibuprofen (ADVIL) 800 MG tablet Take 1 tablet (800 mg total) by mouth every 8 (eight) hours as needed. 30 tablet 0  ? latanoprost (XALATAN) 0.005 % ophthalmic solution Place 1 drop into both eyes at bedtime.     ? latanoprost (XALATAN) 0.005 % ophthalmic solution 1 drop into affected eye in the evening    ? moxifloxacin (VIGAMOX) 0.5 % ophthalmic solution moxifloxacin 0.5 % eye drops    ? naproxen sodium (ALEVE) 220 MG tablet Take 220 mg by mouth.    ? Omeprazole 20 MG TBEC 2 tablets    ? ondansetron (ZOFRAN-ODT) 8 MG disintegrating tablet ondansetron 8 mg disintegrating tablet    ? SUMAtriptan (IMITREX) 100 MG tablet Take 1 tablet (100 mg total) by mouth every 2 (two) hours as needed for migraine or headache. May repeat in 2 hours if headache persists or recurs. 10 tablet 5  ? valACYclovir  (VALTREX) 1000 MG tablet TAKE ONE TABLET BY MOUTH TWICE DAILY 20 tablet 0  ? [DISCONTINUED] chlorproMAZINE (THORAZINE) 10 MG tablet As needed for migraine headache    ? ?No current facility-administered medications on file prior to visit.  ? ? ?Review of Systems  ?Constitutional:  Negative for fever.  ?Eyes:  Negative for visual disturbance.  ?Respiratory:  Negative for cough, shortness of breath and wheezing.   ?Cardiovascular:  Negative for chest pain, palpitations and leg swelling.  ?Gastrointestinal:  Negative for abdominal pain, blood in stool, constipation, diarrhea and nausea.  ?      Occ gerd  ?Genitourinary:  Negative for difficulty urinating, dysuria and hematuria.  ?Musculoskeletal:  Positive for arthralgias and back pain (chronic - doing pretty good now that he is retired).  ?Skin:  Negative for rash.  ?Neurological:  Positive for headaches (migraines once a week).  ?Psychiatric/Behavioral:  Negative for dysphoric mood. The patient is not nervous/anxious.   ? ?   ?Objective:  ? ?Vitals:  ? 10/08/21 0807  ?BP: 110/82  ?Pulse: 86  ?Temp: 98.2 ?F (36.8 ?C)  ?SpO2: 98%  ? ?Filed Weights  ? 10/08/21 0807  ?Weight: 165 lb (74.8 kg)  ? ?Body mass index is 24.37 kg/m?. ? ?BP Readings from Last 3 Encounters:  ?10/08/21 110/82  ?11/18/20 126/74  ?09/12/20 130/82  ? ? ?Wt  Readings from Last 3 Encounters:  ?10/08/21 165 lb (74.8 kg)  ?11/18/20 164 lb (74.4 kg)  ?09/12/20 164 lb 9.6 oz (74.7 kg)  ? ? ?  ?Physical Exam ?Constitutional: He appears well-developed and well-nourished. No distress.  ?HENT:  ?Head: Normocephalic and atraumatic.  ?Right Ear: External ear normal.  ?Left Ear: External ear normal.  ?Mouth/Throat: Oropharynx is clear and moist.  ?Normal ear canals and TM b/l  ?Eyes: Conjunctivae and EOM are normal.  ?Neck: Neck supple. No tracheal deviation present. No thyromegaly present.  ?No carotid bruit  ?Cardiovascular: Normal rate, regular rhythm, normal heart sounds and intact distal pulses.   ?No murmur  heard. ?Pulmonary/Chest: Effort normal and breath sounds normal. No respiratory distress. He has no wheezes. He has no rales.  ?Abdominal: Soft. He exhibits no distension. There is no tenderness.  ?Genitourinary: deferred  ?Musculoskeletal: He exhibits no edema.  ?Lymphadenopathy:   He has no cervical adenopathy.  ?Skin: Skin is warm and dry. He is not diaphoretic.  ?Psychiatric: He has a normal mood and affect. His behavior is normal.  ? ? ? ? ?   ?Assessment & Plan:  ? ?Physical exam: ?Screening blood work  ordered ?Exercise    ?Weight   ?Substance abuse   none ? ? ?Reviewed recommended immunizations. ? ? ?Health Maintenance  ?Topic Date Due  ? TETANUS/TDAP  04/19/2019  ? HIV Screening  10/09/2024 (Originally 03/31/1976)  ? INFLUENZA VACCINE  01/12/2022  ? COLONOSCOPY (Pts 45-26yr Insurance coverage will need to be confirmed)  05/18/2023  ? Hepatitis C Screening  Completed  ? Zoster Vaccines- Shingrix  Completed  ? HPV VACCINES  Aged Out  ? COVID-19 Vaccine  Discontinued  ? ? ? ?See Problem List for Assessment and Plan of chronic medical problems. ? ? ? ?

## 2021-10-06 NOTE — Patient Instructions (Addendum)
? ? ? ?Blood work was ordered.   ? ? ?Medications changes include :  none  ? ? ? ?Return in about 1 year (around 10/09/2022) for Physical Exam. ? ? ?Health Maintenance, Male ?Adopting a healthy lifestyle and getting preventive care are important in promoting health and wellness. Ask your health care provider about: ?The right schedule for you to have regular tests and exams. ?Things you can do on your own to prevent diseases and keep yourself healthy. ?What should I know about diet, weight, and exercise? ?Eat a healthy diet ? ?Eat a diet that includes plenty of vegetables, fruits, low-fat dairy products, and lean protein. ?Do not eat a lot of foods that are high in solid fats, added sugars, or sodium. ?Maintain a healthy weight ?Body mass index (BMI) is a measurement that can be used to identify possible weight problems. It estimates body fat based on height and weight. Your health care provider can help determine your BMI and help you achieve or maintain a healthy weight. ?Get regular exercise ?Get regular exercise. This is one of the most important things you can do for your health. Most adults should: ?Exercise for at least 150 minutes each week. The exercise should increase your heart rate and make you sweat (moderate-intensity exercise). ?Do strengthening exercises at least twice a week. This is in addition to the moderate-intensity exercise. ?Spend less time sitting. Even light physical activity can be beneficial. ?Watch cholesterol and blood lipids ?Have your blood tested for lipids and cholesterol at 61 years of age, then have this test every 5 years. ?You may need to have your cholesterol levels checked more often if: ?Your lipid or cholesterol levels are high. ?You are older than 61 years of age. ?You are at high risk for heart disease. ?What should I know about cancer screening? ?Many types of cancers can be detected early and may often be prevented. Depending on your health history and family history,  you may need to have cancer screening at various ages. This may include screening for: ?Colorectal cancer. ?Prostate cancer. ?Skin cancer. ?Lung cancer. ?What should I know about heart disease, diabetes, and high blood pressure? ?Blood pressure and heart disease ?High blood pressure causes heart disease and increases the risk of stroke. This is more likely to develop in people who have high blood pressure readings or are overweight. ?Talk with your health care provider about your target blood pressure readings. ?Have your blood pressure checked: ?Every 3-5 years if you are 47-36 years of age. ?Every year if you are 80 years old or older. ?If you are between the ages of 72 and 48 and are a current or former smoker, ask your health care provider if you should have a one-time screening for abdominal aortic aneurysm (AAA). ?Diabetes ?Have regular diabetes screenings. This checks your fasting blood sugar level. Have the screening done: ?Once every three years after age 13 if you are at a normal weight and have a low risk for diabetes. ?More often and at a younger age if you are overweight or have a high risk for diabetes. ?What should I know about preventing infection? ?Hepatitis B ?If you have a higher risk for hepatitis B, you should be screened for this virus. Talk with your health care provider to find out if you are at risk for hepatitis B infection. ?Hepatitis C ?Blood testing is recommended for: ?Everyone born from [birth year] through 1965. ?Anyone with known risk factors for hepatitis C. ?Sexually transmitted infections (STIs) ?You should  be screened each year for STIs, including gonorrhea and chlamydia, if: ?You are sexually active and are younger than 61 years of age. ?You are older than 61 years of age and your health care provider tells you that you are at risk for this type of infection. ?Your sexual activity has changed since you were last screened, and you are at increased risk for chlamydia or gonorrhea. Ask  your health care provider if you are at risk. ?Ask your health care provider about whether you are at high risk for HIV. Your health care provider may recommend a prescription medicine to help prevent HIV infection. If you choose to take medicine to prevent HIV, you should first get tested for HIV. You should then be tested every 3 months for as long as you are taking the medicine. ?Follow these instructions at home: ?Alcohol use ?Do not drink alcohol if your health care provider tells you not to drink. ?If you drink alcohol: ?Limit how much you have to 0-2 drinks a day. ?Know how much alcohol is in your drink. In the [place] one drink equals one 12 oz bottle of beer (355 mL), one 5 oz glass of wine (148 mL), or one 1? oz glass of hard liquor (44 mL). ?Lifestyle ?Do not use any products that contain nicotine or tobacco. These products include cigarettes, chewing tobacco, and vaping devices, such as e-cigarettes. If you need help quitting, ask your health care provider. ?Do not use street drugs. ?Do not share needles. ?Ask your health care provider for help if you need support or information about quitting drugs. ?General instructions ?Schedule regular health, dental, and eye exams. ?Stay current with your vaccines. ?Tell your health care provider if: ?You often feel depressed. ?You have ever been abused or do not feel safe at home. ?Summary ?Adopting a healthy lifestyle and getting preventive care are important in promoting health and wellness. ?Follow your health care provider's instructions about healthy diet, exercising, and getting tested or screened for diseases. ?Follow your health care provider's instructions on monitoring your cholesterol and blood pressure. ?This information is not intended to replace advice given to you by your health care provider. Make sure you discuss any questions you have with your health care provider. ?Document Revised: 10/20/2020 Document Reviewed: 10/20/2020 ?Elsevier Patient  Education ? [place]. ? ?

## 2021-10-08 ENCOUNTER — Ambulatory Visit (INDEPENDENT_AMBULATORY_CARE_PROVIDER_SITE_OTHER): Payer: Self-pay | Admitting: Internal Medicine

## 2021-10-08 VITALS — BP 110/82 | HR 86 | Temp 98.2°F | Ht 69.0 in | Wt 165.0 lb

## 2021-10-08 DIAGNOSIS — Z0000 Encounter for general adult medical examination without abnormal findings: Secondary | ICD-10-CM

## 2021-10-08 DIAGNOSIS — G43809 Other migraine, not intractable, without status migrainosus: Secondary | ICD-10-CM

## 2021-10-08 DIAGNOSIS — L4050 Arthropathic psoriasis, unspecified: Secondary | ICD-10-CM

## 2021-10-08 DIAGNOSIS — Z125 Encounter for screening for malignant neoplasm of prostate: Secondary | ICD-10-CM

## 2021-10-08 DIAGNOSIS — R739 Hyperglycemia, unspecified: Secondary | ICD-10-CM

## 2021-10-08 DIAGNOSIS — E7849 Other hyperlipidemia: Secondary | ICD-10-CM

## 2021-10-08 LAB — COMPREHENSIVE METABOLIC PANEL
ALT: 23 U/L (ref 0–53)
AST: 22 U/L (ref 0–37)
Albumin: 4.6 g/dL (ref 3.5–5.2)
Alkaline Phosphatase: 48 U/L (ref 39–117)
BUN: 17 mg/dL (ref 6–23)
CO2: 28 mEq/L (ref 19–32)
Calcium: 9.7 mg/dL (ref 8.4–10.5)
Chloride: 103 mEq/L (ref 96–112)
Creatinine, Ser: 1.03 mg/dL (ref 0.40–1.50)
GFR: 78.95 mL/min (ref >60.00)
Glucose, Bld: 98 mg/dL (ref 70–99)
Potassium: 4 mEq/L (ref 3.5–5.1)
Sodium: 139 mEq/L (ref 135–145)
Total Bilirubin: 0.7 mg/dL (ref 0.2–1.2)
Total Protein: 6.8 g/dL (ref 6.0–8.3)

## 2021-10-08 LAB — CBC WITH DIFFERENTIAL/PLATELET
Basophils Absolute: 0 10*3/uL (ref 0.0–0.1)
Basophils Relative: 0.3 % (ref 0.0–3.0)
Eosinophils Absolute: 0.1 10*3/uL (ref 0.0–0.7)
Eosinophils Relative: 1.9 % (ref 0.0–5.0)
HCT: 43.8 % (ref 39.0–52.0)
Hemoglobin: 15 g/dL (ref 13.0–17.0)
Lymphocytes Relative: 46.3 % — ABNORMAL HIGH (ref 12.0–46.0)
Lymphs Abs: 2.4 10*3/uL (ref 0.7–4.0)
MCHC: 34.3 g/dL (ref 30.0–36.0)
MCV: 94.2 fl (ref 78.0–100.0)
Monocytes Absolute: 0.4 10*3/uL (ref 0.1–1.0)
Monocytes Relative: 8.2 % (ref 3.0–12.0)
Neutro Abs: 2.3 10*3/uL (ref 1.4–7.7)
Neutrophils Relative %: 43.3 % (ref 43.0–77.0)
Platelets: 211 10*3/uL (ref 150.0–400.0)
RBC: 4.65 Mil/uL (ref 4.22–5.81)
RDW: 13 % (ref 11.5–15.5)
WBC: 5.2 10*3/uL (ref 4.0–10.5)

## 2021-10-08 LAB — TSH: TSH: 2.29 u[IU]/mL (ref 0.35–5.50)

## 2021-10-08 LAB — HEMOGLOBIN A1C: Hgb A1c MFr Bld: 5.2 % (ref 4.6–6.5)

## 2021-10-08 LAB — LIPID PANEL
Cholesterol: 257 mg/dL — ABNORMAL HIGH (ref 0–200)
HDL: 62.5 mg/dL (ref >39.00)
LDL Cholesterol: 166 mg/dL — ABNORMAL HIGH (ref 0–99)
NonHDL: 194.61
Total CHOL/HDL Ratio: 4
Triglycerides: 142 mg/dL (ref 0.0–149.0)
VLDL: 28.4 mg/dL (ref 0.0–40.0)

## 2021-10-08 LAB — PSA: PSA: 2.01 ng/mL (ref 0.10–4.00)

## 2021-10-08 NOTE — Assessment & Plan Note (Addendum)
Chronic ?Not taking Emgality injections every 30 days since not on insurance ?Continue Imitrex 100 mg as needed, Zofran as needed for nausea ?

## 2021-10-08 NOTE — Assessment & Plan Note (Signed)
Chronic ?Regular exercise and healthy diet encouraged ?Check lipid panel  ?Currently diet controlled ?

## 2021-10-08 NOTE — Assessment & Plan Note (Addendum)
Chronic ?On Humira ?Management per Dr. Amil Amen ?

## 2021-11-13 ENCOUNTER — Other Ambulatory Visit: Payer: Self-pay | Admitting: Internal Medicine

## 2022-01-25 ENCOUNTER — Telehealth: Payer: Self-pay | Admitting: Internal Medicine

## 2022-01-25 DIAGNOSIS — Z136 Encounter for screening for cardiovascular disorders: Secondary | ICD-10-CM

## 2022-01-25 NOTE — Telephone Encounter (Signed)
Pt wife is requesting orders for a CT Scan. She stated he has high cholesterol and thinks he needs a ct scan. She had Dr. Jenny Reichmann order one for her and now she would like one ordered for her husband.      Please advise.

## 2022-01-25 NOTE — Telephone Encounter (Signed)
This is really best left to Dr Quay Burow to order, since this is not urgent and the results would need to go to her, thanks

## 2022-01-25 NOTE — Telephone Encounter (Signed)
Patient's wife informed and she voiced understanding.

## 2022-01-27 NOTE — Telephone Encounter (Signed)
CT scan ordered - they will call him to schedule

## 2022-01-27 NOTE — Addendum Note (Signed)
Addended by: Binnie Rail on: 01/27/2022 06:54 AM   Modules accepted: Orders

## 2022-01-27 NOTE — Telephone Encounter (Signed)
Spoke with patient's wife and info given. 

## 2022-02-26 ENCOUNTER — Ambulatory Visit (HOSPITAL_BASED_OUTPATIENT_CLINIC_OR_DEPARTMENT_OTHER)
Admission: RE | Admit: 2022-02-26 | Discharge: 2022-02-26 | Disposition: A | Discharge status: Home / Self Care | Payer: Self-pay | Source: Ambulatory Visit | Attending: Internal Medicine | Admitting: Internal Medicine

## 2022-02-26 DIAGNOSIS — Z136 Encounter for screening for cardiovascular disorders: Secondary | ICD-10-CM | POA: Insufficient documentation

## 2022-02-28 ENCOUNTER — Encounter: Payer: Self-pay | Admitting: Internal Medicine

## 2022-02-28 DIAGNOSIS — R931 Abnormal findings on diagnostic imaging of heart and coronary circulation: Secondary | ICD-10-CM | POA: Insufficient documentation

## 2022-03-06 ENCOUNTER — Encounter: Payer: Self-pay | Admitting: Internal Medicine

## 2022-03-06 DIAGNOSIS — E7849 Other hyperlipidemia: Secondary | ICD-10-CM

## 2022-03-06 DIAGNOSIS — M5416 Radiculopathy, lumbar region: Secondary | ICD-10-CM | POA: Diagnosis not present

## 2022-03-08 MED ORDER — ROSUVASTATIN CALCIUM 5 MG PO TABS: 5.0000 mg | ORAL_TABLET | ORAL | 3 refills | Status: DC

## 2022-03-16 DIAGNOSIS — L4050 Arthropathic psoriasis, unspecified: Secondary | ICD-10-CM | POA: Diagnosis not present

## 2022-03-26 DIAGNOSIS — M1711 Unilateral primary osteoarthritis, right knee: Secondary | ICD-10-CM | POA: Diagnosis not present

## 2022-03-26 DIAGNOSIS — M25561 Pain in right knee: Secondary | ICD-10-CM | POA: Diagnosis not present

## 2022-04-09 DIAGNOSIS — H401131 Primary open-angle glaucoma, bilateral, mild stage: Secondary | ICD-10-CM | POA: Diagnosis not present

## 2022-04-09 DIAGNOSIS — Z961 Presence of intraocular lens: Secondary | ICD-10-CM | POA: Diagnosis not present

## 2022-05-10 ENCOUNTER — Encounter: Payer: Self-pay | Admitting: Internal Medicine

## 2022-05-14 DIAGNOSIS — M2550 Pain in unspecified joint: Secondary | ICD-10-CM | POA: Diagnosis not present

## 2022-05-14 DIAGNOSIS — R5382 Chronic fatigue, unspecified: Secondary | ICD-10-CM | POA: Diagnosis not present

## 2022-05-14 DIAGNOSIS — L409 Psoriasis, unspecified: Secondary | ICD-10-CM | POA: Diagnosis not present

## 2022-05-14 DIAGNOSIS — L4050 Arthropathic psoriasis, unspecified: Secondary | ICD-10-CM | POA: Diagnosis not present

## 2022-05-14 DIAGNOSIS — Z79899 Other long term (current) drug therapy: Secondary | ICD-10-CM | POA: Diagnosis not present

## 2022-05-17 ENCOUNTER — Ambulatory Visit: Payer: BC Managed Care – PPO

## 2022-05-19 ENCOUNTER — Encounter: Payer: Self-pay | Admitting: Internal Medicine

## 2022-05-19 NOTE — Progress Notes (Signed)
Subjective:    Patient ID: Randy Moran, male    DOB: 1960/12/20, 61 y.o.   MRN: [id number]      HPI Randy Moran is here for  Chief Complaint  Patient presents with   Insomnia    Insomnia, and wants to discuss cholesterol  medication   He is back to working. He is working day, but working a lot.     Insomnia - having difficulty sleeping.  Melatonin did not help.  He falls asleep easily but an hour later he is wide awake for 2-4 hours and then can get back to sleep - then wakes up at 3-4 and then is up.     Hyperlipidemia -- crestor causing tiredness and achiness.  He has been taking it twice a week but not consistently.  He is not 100% sure if it still causing symptoms.  Coronary calcium score 86.6 - 66th % on 03/01/22.      Medications and allergies reviewed with patient and updated if appropriate.  Current Outpatient Medications on File Prior to Visit  Medication Sig Dispense Refill   acetaminophen (TYLENOL) 500 MG tablet Take 500-1,000 mg by mouth every 6 (six) hours as needed for mild pain, moderate pain or headache.      Galcanezumab-gnlm (EMGALITY) 120 MG/ML SOAJ Inject 120 mg into the skin every 30 (thirty) days. 1 mL 11   HUMIRA PEN 40 MG/0.4ML PNKT SMARTSIG:40 Milligram(s) SUB-Q Every 2 Weeks     latanoprost (XALATAN) 0.005 % ophthalmic solution Place 1 drop into both eyes at bedtime.      naproxen sodium (ALEVE) 220 MG tablet Take 220 mg by mouth.     Omeprazole 20 MG TBEC 2 tablets     rosuvastatin (CRESTOR) 5 MG tablet Take 1 tablet (5 mg total) by mouth 3 (three) times a week. 13 tablet 3   SUMAtriptan (IMITREX) 100 MG tablet Take 1 tablet (100 mg total) by mouth every 2 (two) hours as needed for migraine or headache. May repeat in 2 hours if headache persists or recurs. 10 tablet 5   valACYclovir (VALTREX) 1000 MG tablet TAKE ONE TABLET BY MOUTH TWICE DAILY 20 tablet 0   [DISCONTINUED] chlorproMAZINE (THORAZINE) 10 MG tablet As needed for migraine headache      No current facility-administered medications on file prior to visit.    Review of Systems     Objective:   Vitals:   05/21/22 0835  BP: 116/80  Pulse: 89  Temp: 98.3 F (36.8 C)  SpO2: 98%   BP Readings from Last 3 Encounters:  05/21/22 116/80  10/08/21 110/82  11/18/20 126/74   Wt Readings from Last 3 Encounters:  05/21/22 170 lb (77.1 kg)  10/08/21 165 lb (74.8 kg)  11/18/20 164 lb (74.4 kg)   Body mass index is 25.1 kg/m.    Physical Exam Constitutional:      General: He is not in acute distress.    Appearance: Normal appearance. He is not ill-appearing.  HENT:     Head: Normocephalic and atraumatic.  Skin:    General: Skin is warm and dry.  Neurological:     Mental Status: He is alert. Mental status is at baseline.  Psychiatric:        Mood and Affect: Mood normal.            Assessment & Plan:     Flu vaccine given today   See Problem List for Assessment and Plan of chronic medical problems.

## 2022-05-20 DIAGNOSIS — M1711 Unilateral primary osteoarthritis, right knee: Secondary | ICD-10-CM | POA: Diagnosis not present

## 2022-05-21 ENCOUNTER — Ambulatory Visit: Payer: BC Managed Care – PPO | Admitting: Internal Medicine

## 2022-05-21 ENCOUNTER — Ambulatory Visit (INDEPENDENT_AMBULATORY_CARE_PROVIDER_SITE_OTHER): Payer: BC Managed Care – PPO | Admitting: Internal Medicine

## 2022-05-21 VITALS — BP 116/80 | HR 89 | Temp 98.3°F | Ht 69.0 in | Wt 170.0 lb

## 2022-05-21 DIAGNOSIS — G479 Sleep disorder, unspecified: Secondary | ICD-10-CM

## 2022-05-21 DIAGNOSIS — E7849 Other hyperlipidemia: Secondary | ICD-10-CM | POA: Diagnosis not present

## 2022-05-21 DIAGNOSIS — Z23 Encounter for immunization: Secondary | ICD-10-CM

## 2022-05-21 MED ORDER — ZALEPLON 5 MG PO CAPS: 5.0000 mg | ORAL_CAPSULE | Freq: Every evening | ORAL | 0 refills | Status: DC | PRN

## 2022-05-21 MED ORDER — ROSUVASTATIN CALCIUM 5 MG PO TABS: 5.0000 mg | ORAL_TABLET | ORAL | 3 refills | Status: DC

## 2022-05-21 NOTE — Patient Instructions (Addendum)
      Blood work was ordered.   Have this done in 6-8 weeks.      Medications changes include :   sonata 5 mg for sleep - can try 10 mg if 5 mg is not effective.       Return in about 5 months (around 10/20/2022) for Physical Exam.

## 2022-05-21 NOTE — Assessment & Plan Note (Signed)
Chronic Continue Crestor 5 mg twice weekly-discussed that if he does have side effects, would consider other options Lipid panel, hepatic function in 6-8 weeks Can consider adding Zetia if needed

## 2022-05-21 NOTE — Assessment & Plan Note (Signed)
Chronic He was working nights for a long time and had issues with sleep, but is no longer working nights, but sleep is still not very good Getting broken up an adequate amount of sleep Trial of Sonata 5 mg nightly-if this is not effective over the next week we will try 10 mg-if not effective he will let me know and we can consider other options such as [place]

## 2022-05-21 NOTE — Addendum Note (Signed)
Addended by: Marcina Millard on: 05/21/2022 12:47 PM   Modules accepted: Orders

## 2022-05-27 DIAGNOSIS — M1711 Unilateral primary osteoarthritis, right knee: Secondary | ICD-10-CM | POA: Diagnosis not present

## 2022-06-01 ENCOUNTER — Encounter: Payer: Self-pay | Admitting: Internal Medicine

## 2022-06-02 MED ORDER — ZALEPLON 10 MG PO CAPS: 10.0000 mg | ORAL_CAPSULE | Freq: Every evening | ORAL | Status: DC | PRN

## 2022-06-03 DIAGNOSIS — M1711 Unilateral primary osteoarthritis, right knee: Secondary | ICD-10-CM | POA: Diagnosis not present

## 2022-06-18 ENCOUNTER — Other Ambulatory Visit: Payer: Self-pay | Admitting: Internal Medicine

## 2022-06-21 ENCOUNTER — Encounter: Payer: Self-pay | Admitting: Internal Medicine

## 2022-06-22 MED ORDER — ZALEPLON 10 MG PO CAPS: 10.0000 mg | ORAL_CAPSULE | Freq: Every evening | ORAL | 5 refills | Status: DC | PRN

## 2022-07-03 DIAGNOSIS — M5416 Radiculopathy, lumbar region: Secondary | ICD-10-CM | POA: Diagnosis not present

## 2022-07-08 ENCOUNTER — Other Ambulatory Visit (INDEPENDENT_AMBULATORY_CARE_PROVIDER_SITE_OTHER): Payer: BC Managed Care – PPO

## 2022-07-08 ENCOUNTER — Other Ambulatory Visit: Payer: Self-pay | Admitting: Internal Medicine

## 2022-07-08 DIAGNOSIS — E7849 Other hyperlipidemia: Secondary | ICD-10-CM | POA: Diagnosis not present

## 2022-07-08 LAB — LIPID PANEL
Cholesterol: 192 mg/dL (ref 0–200)
HDL: 66.6 mg/dL (ref >39.00)
LDL Cholesterol: 104 mg/dL — ABNORMAL HIGH (ref 0–99)
NonHDL: 125.76
Total CHOL/HDL Ratio: 3
Triglycerides: 110 mg/dL (ref 0.0–149.0)
VLDL: 22 mg/dL (ref 0.0–40.0)

## 2022-07-08 LAB — HEPATIC FUNCTION PANEL
ALT: 36 U/L (ref 0–53)
AST: 26 U/L (ref 0–37)
Albumin: 4.6 g/dL (ref 3.5–5.2)
Alkaline Phosphatase: 49 U/L (ref 39–117)
Bilirubin, Direct: 0.1 mg/dL (ref 0.0–0.3)
Total Bilirubin: 0.4 mg/dL (ref 0.2–1.2)
Total Protein: 7.2 g/dL (ref 6.0–8.3)

## 2022-07-08 MED ORDER — ROSUVASTATIN CALCIUM 5 MG PO TABS: 5.0000 mg | ORAL_TABLET | ORAL | 3 refills | Status: DC

## 2022-07-14 ENCOUNTER — Other Ambulatory Visit: Payer: Self-pay | Admitting: Internal Medicine

## 2022-10-19 ENCOUNTER — Encounter: Payer: Self-pay | Admitting: Internal Medicine

## 2022-10-26 ENCOUNTER — Encounter: Payer: Self-pay | Admitting: Internal Medicine

## 2022-10-26 ENCOUNTER — Other Ambulatory Visit: Payer: Self-pay | Admitting: Internal Medicine

## 2022-10-26 ENCOUNTER — Other Ambulatory Visit: Payer: Self-pay

## 2022-10-26 MED ORDER — VALACYCLOVIR HCL 1 G PO TABS: 1000.0000 mg | ORAL_TABLET | Freq: Two times a day (BID) | ORAL | 0 refills | Status: DC

## 2022-10-28 ENCOUNTER — Encounter: Payer: Self-pay | Admitting: Internal Medicine

## 2022-10-28 NOTE — Patient Instructions (Addendum)
Blood work was ordered.   The lab is on the first floor.    Medications changes include :   none     Return in about 1 year (around 10/29/2023) for Physical Exam.    Health Maintenance, Male Adopting a healthy lifestyle and getting preventive care are important in promoting health and wellness. Ask your health care provider about: The right schedule for you to have regular tests and exams. Things you can do on your own to prevent diseases and keep yourself healthy. What should I know about diet, weight, and exercise? Eat a healthy diet  Eat a diet that includes plenty of vegetables, fruits, low-fat dairy products, and lean protein. Do not eat a lot of foods that are high in solid fats, added sugars, or sodium. Maintain a healthy weight Body mass index (BMI) is a measurement that can be used to identify possible weight problems. It estimates body fat based on height and weight. Your health care provider can help determine your BMI and help you achieve or maintain a healthy weight. Get regular exercise Get regular exercise. This is one of the most important things you can do for your health. Most adults should: Exercise for at least 150 minutes each week. The exercise should increase your heart rate and make you sweat (moderate-intensity exercise). Do strengthening exercises at least twice a week. This is in addition to the moderate-intensity exercise. Spend less time sitting. Even light physical activity can be beneficial. Watch cholesterol and blood lipids Have your blood tested for lipids and cholesterol at 62 years of age, then have this test every 5 years. You may need to have your cholesterol levels checked more often if: Your lipid or cholesterol levels are high. You are older than 62 years of age. You are at high risk for heart disease. What should I know about cancer screening? Many types of cancers can be detected early and may often be prevented. Depending on  your health history and family history, you may need to have cancer screening at various ages. This may include screening for: Colorectal cancer. Prostate cancer. Skin cancer. Lung cancer. What should I know about heart disease, diabetes, and high blood pressure? Blood pressure and heart disease High blood pressure causes heart disease and increases the risk of stroke. This is more likely to develop in people who have high blood pressure readings or are overweight. Talk with your health care provider about your target blood pressure readings. Have your blood pressure checked: Every 3-5 years if you are 44-67 years of age. Every year if you are 61 years old or older. If you are between the ages of 77 and 55 and are a current or former smoker, ask your health care provider if you should have a one-time screening for abdominal aortic aneurysm (AAA). Diabetes Have regular diabetes screenings. This checks your fasting blood sugar level. Have the screening done: Once every three years after age 65 if you are at a normal weight and have a low risk for diabetes. More often and at a younger age if you are overweight or have a high risk for diabetes. What should I know about preventing infection? Hepatitis B If you have a higher risk for hepatitis B, you should be screened for this virus. Talk with your health care provider to find out if you are at risk for hepatitis B infection. Hepatitis C Blood testing is recommended for: Everyone born from [birth year] through 1965. Anyone with known  risk factors for hepatitis C. Sexually transmitted infections (STIs) You should be screened each year for STIs, including gonorrhea and chlamydia, if: You are sexually active and are younger than 62 years of age. You are older than 62 years of age and your health care provider tells you that you are at risk for this type of infection. Your sexual activity has changed since you were last screened, and you are at increased  risk for chlamydia or gonorrhea. Ask your health care provider if you are at risk. Ask your health care provider about whether you are at high risk for HIV. Your health care provider may recommend a prescription medicine to help prevent HIV infection. If you choose to take medicine to prevent HIV, you should first get tested for HIV. You should then be tested every 3 months for as long as you are taking the medicine. Follow these instructions at home: Alcohol use Do not drink alcohol if your health care provider tells you not to drink. If you drink alcohol: Limit how much you have to 0-2 drinks a day. Know how much alcohol is in your drink. In the [place] one drink equals one 12 oz bottle of beer (355 mL), one 5 oz glass of wine (148 mL), or one 1 oz glass of hard liquor (44 mL). Lifestyle Do not use any products that contain nicotine or tobacco. These products include cigarettes, chewing tobacco, and vaping devices, such as e-cigarettes. If you need help quitting, ask your health care provider. Do not use street drugs. Do not share needles. Ask your health care provider for help if you need support or information about quitting drugs. General instructions Schedule regular health, dental, and eye exams. Stay current with your vaccines. Tell your health care provider if: You often feel depressed. You have ever been abused or do not feel safe at home. Summary Adopting a healthy lifestyle and getting preventive care are important in promoting health and wellness. Follow your health care provider's instructions about healthy diet, exercising, and getting tested or screened for diseases. Follow your health care provider's instructions on monitoring your cholesterol and blood pressure. This information is not intended to replace advice given to you by your health care provider. Make sure you discuss any questions you have with your health care provider. Document Revised: 10/20/2020 Document  Reviewed: 10/20/2020 Elsevier Patient Education  2023 [employer].

## 2022-10-28 NOTE — Progress Notes (Signed)
Subjective:    Patient ID: Randy Moran, male    DOB: [birth year], 62 y.o.   MRN: [id number]     HPI Randy Moran is here for a physical exam and his chronic medical problems.      Medications and allergies reviewed with patient and updated if appropriate.  Current Outpatient Medications on File Prior to Visit  Medication Sig Dispense Refill   acetaminophen (TYLENOL) 500 MG tablet Take 500-1,000 mg by mouth every 6 (six) hours as needed for mild pain, moderate pain or headache.      folic acid (FOLVITE) 1 MG tablet Take 1 mg by mouth daily.     latanoprost (XALATAN) 0.005 % ophthalmic solution Place 1 drop into both eyes at bedtime.      methotrexate (RHEUMATREX) 2.5 MG tablet Take 15 mg by mouth once a week.     naproxen sodium (ALEVE) 220 MG tablet Take 220 mg by mouth.     rosuvastatin (CRESTOR) 5 MG tablet Take 1 tablet (5 mg total) by mouth 3 (three) times a week. 36 tablet 3   SUMAtriptan (IMITREX) 100 MG tablet Take 1 tablet (100 mg total) by mouth every 2 (two) hours as needed for migraine or headache. May repeat in 2 hours if headache persists or recurs. 10 tablet 5   valACYclovir (VALTREX) 1000 MG tablet Take 1 tablet (1,000 mg total) by mouth 2 (two) times daily. 20 tablet 0   zaleplon (SONATA) 10 MG capsule Take 1 capsule (10 mg total) by mouth at bedtime as needed for sleep. 30 capsule 5   [DISCONTINUED] chlorproMAZINE (THORAZINE) 10 MG tablet As needed for migraine headache     No current facility-administered medications on file prior to visit.    Review of Systems  Constitutional:  Negative for fever.  Eyes:  Negative for visual disturbance.  Respiratory:  Negative for cough, shortness of breath and wheezing.   Cardiovascular:  Negative for chest pain, palpitations and leg swelling.  Gastrointestinal:  Negative for abdominal pain, blood in stool, constipation and diarrhea.       No gerd  Genitourinary:  Negative for difficulty urinating and dysuria.   Musculoskeletal:  Positive for arthralgias and back pain (sciatica).  Skin:  Negative for rash.  Neurological:  Positive for headaches. Negative for dizziness and light-headedness.  Psychiatric/Behavioral:  Negative for dysphoric mood. The patient is not nervous/anxious.        Objective:   Vitals:   10/29/22 0750  BP: 116/74  Pulse: 74  Temp: 98.3 F (36.8 C)  SpO2: 97%   Filed Weights   10/29/22 0750  Weight: 166 lb (75.3 kg)   Body mass index is 24.51 kg/m.  BP Readings from Last 3 Encounters:  10/29/22 116/74  05/21/22 116/80  10/08/21 110/82    Wt Readings from Last 3 Encounters:  10/29/22 166 lb (75.3 kg)  05/21/22 170 lb (77.1 kg)  10/08/21 165 lb (74.8 kg)      Physical Exam Constitutional: He appears well-developed and well-nourished. No distress.  HENT:  Head: Normocephalic and atraumatic.  Right Ear: External ear normal.  Left Ear: External ear normal.  Normal ear canals and TM b/l  Mouth/Throat: Oropharynx is clear and moist. Eyes: Conjunctivae and EOM are normal.  Neck: Neck supple. No tracheal deviation present. No thyromegaly present.  No carotid bruit  Cardiovascular: Normal rate, regular rhythm, normal heart sounds and intact distal pulses.   No murmur heard.  No lower extremity edema. Pulmonary/Chest: Effort normal and  breath sounds normal. No respiratory distress. He has no wheezes. He has no rales.  Abdominal: Soft. He exhibits no distension. There is no tenderness.  Genitourinary: deferred  Lymphadenopathy:   He has no cervical adenopathy.  Skin: Skin is warm and dry. He is not diaphoretic.  Psychiatric: He has a normal mood and affect. His behavior is normal.         Assessment & Plan:   Physical exam: Screening blood work  ordered Exercise   yard work, does lots of walking  Weight normal Substance abuse   none   Reviewed recommended immunizations.   Health Maintenance  Topic Date Due   DTaP/Tdap/Td (2 - Tdap)  04/19/2019   INFLUENZA VACCINE  01/13/2023   COLONOSCOPY (Pts 45-42yrs Insurance coverage will need to be confirmed)  05/18/2023   Hepatitis C Screening  Completed   Zoster Vaccines- Shingrix  Completed   HPV VACCINES  Aged Out   COVID-19 Vaccine  Discontinued   HIV Screening  Discontinued     See Problem List for Assessment and Plan of chronic medical problems.

## 2022-10-29 ENCOUNTER — Ambulatory Visit (INDEPENDENT_AMBULATORY_CARE_PROVIDER_SITE_OTHER): Payer: Self-pay | Admitting: Internal Medicine

## 2022-10-29 VITALS — BP 116/74 | HR 74 | Temp 98.3°F | Ht 69.0 in | Wt 166.0 lb

## 2022-10-29 DIAGNOSIS — G43809 Other migraine, not intractable, without status migrainosus: Secondary | ICD-10-CM

## 2022-10-29 DIAGNOSIS — L4050 Arthropathic psoriasis, unspecified: Secondary | ICD-10-CM

## 2022-10-29 DIAGNOSIS — R931 Abnormal findings on diagnostic imaging of heart and coronary circulation: Secondary | ICD-10-CM

## 2022-10-29 DIAGNOSIS — Z125 Encounter for screening for malignant neoplasm of prostate: Secondary | ICD-10-CM

## 2022-10-29 DIAGNOSIS — E7849 Other hyperlipidemia: Secondary | ICD-10-CM

## 2022-10-29 DIAGNOSIS — R739 Hyperglycemia, unspecified: Secondary | ICD-10-CM

## 2022-10-29 DIAGNOSIS — G479 Sleep disorder, unspecified: Secondary | ICD-10-CM

## 2022-10-29 DIAGNOSIS — Z0000 Encounter for general adult medical examination without abnormal findings: Secondary | ICD-10-CM

## 2022-10-29 LAB — LIPID PANEL
Cholesterol: 200 mg/dL (ref 0–200)
HDL: 62.2 mg/dL (ref >39.00)
LDL Cholesterol: 122 mg/dL — ABNORMAL HIGH (ref 0–99)
NonHDL: 138.14
Total CHOL/HDL Ratio: 3
Triglycerides: 83 mg/dL (ref 0.0–149.0)
VLDL: 16.6 mg/dL (ref 0.0–40.0)

## 2022-10-29 LAB — CBC WITH DIFFERENTIAL/PLATELET
Basophils Absolute: 0 10*3/uL (ref 0.0–0.1)
Basophils Relative: 0.4 % (ref 0.0–3.0)
Eosinophils Absolute: 0.1 10*3/uL (ref 0.0–0.7)
Eosinophils Relative: 2.2 % (ref 0.0–5.0)
HCT: 42.9 % (ref 39.0–52.0)
Hemoglobin: 14.7 g/dL (ref 13.0–17.0)
Lymphocytes Relative: 44.8 % (ref 12.0–46.0)
Lymphs Abs: 2.2 10*3/uL (ref 0.7–4.0)
MCHC: 34.2 g/dL (ref 30.0–36.0)
MCV: 95.5 fl (ref 78.0–100.0)
Monocytes Absolute: 0.3 10*3/uL (ref 0.1–1.0)
Monocytes Relative: 6.8 % (ref 3.0–12.0)
Neutro Abs: 2.2 10*3/uL (ref 1.4–7.7)
Neutrophils Relative %: 45.8 % (ref 43.0–77.0)
Platelets: 185 10*3/uL (ref 150.0–400.0)
RBC: 4.49 Mil/uL (ref 4.22–5.81)
RDW: 12.8 % (ref 11.5–15.5)
WBC: 4.8 10*3/uL (ref 4.0–10.5)

## 2022-10-29 LAB — COMPREHENSIVE METABOLIC PANEL
ALT: 30 U/L (ref 0–53)
AST: 27 U/L (ref 0–37)
Albumin: 4.3 g/dL (ref 3.5–5.2)
Alkaline Phosphatase: 43 U/L (ref 39–117)
BUN: 16 mg/dL (ref 6–23)
CO2: 31 mEq/L (ref 19–32)
Calcium: 9.2 mg/dL (ref 8.4–10.5)
Chloride: 103 mEq/L (ref 96–112)
Creatinine, Ser: 0.99 mg/dL (ref 0.40–1.50)
GFR: 82.18 mL/min (ref >60.00)
Glucose, Bld: 95 mg/dL (ref 70–99)
Potassium: 4.1 mEq/L (ref 3.5–5.1)
Sodium: 140 mEq/L (ref 135–145)
Total Bilirubin: 0.7 mg/dL (ref 0.2–1.2)
Total Protein: 6.4 g/dL (ref 6.0–8.3)

## 2022-10-29 LAB — HEMOGLOBIN A1C: Hgb A1c MFr Bld: 5.5 % (ref 4.6–6.5)

## 2022-10-29 LAB — TSH: TSH: 1.85 u[IU]/mL (ref 0.35–5.50)

## 2022-10-29 LAB — PSA: PSA: 1.93 ng/mL (ref 0.10–4.00)

## 2022-10-29 NOTE — Assessment & Plan Note (Addendum)
Chronic controlled Continue Sonata 10 mg nightly as needed

## 2022-10-29 NOTE — Assessment & Plan Note (Signed)
Chronic Currently on Crestor 5 mg 3 times a week-LDL above goal Discussed adding Zetia Continue Crestor 5 mg 3 days a week

## 2022-10-29 NOTE — Assessment & Plan Note (Addendum)
Chronic No longer seeing neurology Not having many migraines Has Imitrex 100 mg to use as needed

## 2022-10-29 NOTE — Assessment & Plan Note (Addendum)
Chronic On methotrexate Management per Dr. Dierdre Forth

## 2022-10-29 NOTE — Assessment & Plan Note (Addendum)
Chronic Taking  Crestor 5 mg twice weekly - try increasing to three times a week Lipid panel, CMP Can consider adding Zetia if needed  Will recheck lipids in a couple of months - he will update me with how he is doing

## 2023-02-04 DIAGNOSIS — Z961 Presence of intraocular lens: Secondary | ICD-10-CM | POA: Diagnosis not present

## 2023-02-04 DIAGNOSIS — H401131 Primary open-angle glaucoma, bilateral, mild stage: Secondary | ICD-10-CM | POA: Diagnosis not present

## 2023-02-21 DIAGNOSIS — M79641 Pain in right hand: Secondary | ICD-10-CM | POA: Diagnosis not present

## 2023-02-21 DIAGNOSIS — Z79899 Other long term (current) drug therapy: Secondary | ICD-10-CM | POA: Diagnosis not present

## 2023-02-21 DIAGNOSIS — R5382 Chronic fatigue, unspecified: Secondary | ICD-10-CM | POA: Diagnosis not present

## 2023-02-21 DIAGNOSIS — L409 Psoriasis, unspecified: Secondary | ICD-10-CM | POA: Diagnosis not present

## 2023-02-21 DIAGNOSIS — M79642 Pain in left hand: Secondary | ICD-10-CM | POA: Diagnosis not present

## 2023-02-21 DIAGNOSIS — L4050 Arthropathic psoriasis, unspecified: Secondary | ICD-10-CM | POA: Diagnosis not present

## 2023-02-22 DIAGNOSIS — M5416 Radiculopathy, lumbar region: Secondary | ICD-10-CM | POA: Diagnosis not present

## 2023-03-15 ENCOUNTER — Encounter: Payer: Self-pay | Admitting: Internal Medicine

## 2023-03-15 DIAGNOSIS — E7800 Pure hypercholesterolemia, unspecified: Secondary | ICD-10-CM

## 2023-03-16 ENCOUNTER — Other Ambulatory Visit: Payer: Self-pay

## 2023-03-16 MED ORDER — EZETIMIBE 10 MG PO TABS: 10.0000 mg | ORAL_TABLET | Freq: Every day | ORAL | 3 refills | Status: DC

## 2023-03-16 MED ORDER — ROSUVASTATIN CALCIUM 5 MG PO TABS: 5.0000 mg | ORAL_TABLET | ORAL | 3 refills | Status: DC

## 2023-03-16 MED ORDER — VALACYCLOVIR HCL 1 G PO TABS: 1000.0000 mg | ORAL_TABLET | Freq: Two times a day (BID) | ORAL | 5 refills | Status: DC

## 2023-03-31 DIAGNOSIS — R350 Frequency of micturition: Secondary | ICD-10-CM | POA: Diagnosis not present

## 2023-03-31 DIAGNOSIS — N411 Chronic prostatitis: Secondary | ICD-10-CM | POA: Diagnosis not present

## 2023-03-31 DIAGNOSIS — R102 Pelvic and perineal pain: Secondary | ICD-10-CM | POA: Diagnosis not present

## 2023-05-04 ENCOUNTER — Encounter: Payer: Self-pay | Admitting: Internal Medicine

## 2023-05-24 DIAGNOSIS — G90521 Complex regional pain syndrome I of right lower limb: Secondary | ICD-10-CM | POA: Diagnosis not present

## 2023-05-25 ENCOUNTER — Ambulatory Visit: Payer: BC Managed Care – PPO

## 2023-06-14 ENCOUNTER — Other Ambulatory Visit (INDEPENDENT_AMBULATORY_CARE_PROVIDER_SITE_OTHER): Payer: BC Managed Care – PPO

## 2023-06-14 DIAGNOSIS — E7800 Pure hypercholesterolemia, unspecified: Secondary | ICD-10-CM | POA: Diagnosis not present

## 2023-06-14 LAB — LIPID PANEL
Cholesterol: 242 mg/dL — ABNORMAL HIGH (ref 0–200)
HDL: 67.4 mg/dL (ref >39.00)
LDL Cholesterol: 152 mg/dL — ABNORMAL HIGH (ref 0–99)
NonHDL: 175.04
Total CHOL/HDL Ratio: 4
Triglycerides: 113 mg/dL (ref 0.0–149.0)
VLDL: 22.6 mg/dL (ref 0.0–40.0)

## 2023-06-14 LAB — HEPATIC FUNCTION PANEL
ALT: 26 U/L (ref 0–53)
AST: 21 U/L (ref 0–37)
Albumin: 4.4 g/dL (ref 3.5–5.2)
Alkaline Phosphatase: 42 U/L (ref 39–117)
Bilirubin, Direct: 0.1 mg/dL (ref 0.0–0.3)
Total Bilirubin: 0.6 mg/dL (ref 0.2–1.2)
Total Protein: 6.7 g/dL (ref 6.0–8.3)

## 2023-06-19 ENCOUNTER — Encounter: Payer: Self-pay | Admitting: Internal Medicine

## 2023-06-19 DIAGNOSIS — E7849 Other hyperlipidemia: Secondary | ICD-10-CM

## 2023-06-22 MED ORDER — PRAVASTATIN SODIUM 20 MG PO TABS: 20.0000 mg | ORAL_TABLET | Freq: Every day | ORAL | 0 refills | Status: DC

## 2023-07-18 DIAGNOSIS — H401131 Primary open-angle glaucoma, bilateral, mild stage: Secondary | ICD-10-CM | POA: Diagnosis not present

## 2023-07-28 DIAGNOSIS — L820 Inflamed seborrheic keratosis: Secondary | ICD-10-CM | POA: Diagnosis not present

## 2023-07-28 DIAGNOSIS — L570 Actinic keratosis: Secondary | ICD-10-CM | POA: Diagnosis not present

## 2023-08-12 ENCOUNTER — Encounter: Payer: Self-pay | Admitting: Internal Medicine

## 2023-08-14 MED ORDER — ZALEPLON 10 MG PO CAPS: 10.0000 mg | ORAL_CAPSULE | Freq: Every evening | ORAL | 5 refills | Status: DC | PRN

## 2023-09-27 DIAGNOSIS — M5416 Radiculopathy, lumbar region: Secondary | ICD-10-CM | POA: Diagnosis not present

## 2023-10-06 DIAGNOSIS — H401131 Primary open-angle glaucoma, bilateral, mild stage: Secondary | ICD-10-CM | POA: Diagnosis not present

## 2023-10-18 DIAGNOSIS — M170 Bilateral primary osteoarthritis of knee: Secondary | ICD-10-CM | POA: Diagnosis not present

## 2023-11-11 ENCOUNTER — Encounter: Payer: BC Managed Care – PPO | Admitting: Internal Medicine

## 2023-11-21 ENCOUNTER — Encounter: Payer: Self-pay | Admitting: Internal Medicine

## 2023-11-21 DIAGNOSIS — R739 Hyperglycemia, unspecified: Secondary | ICD-10-CM | POA: Insufficient documentation

## 2023-11-21 DIAGNOSIS — I2510 Atherosclerotic heart disease of native coronary artery without angina pectoris: Secondary | ICD-10-CM | POA: Insufficient documentation

## 2023-11-21 DIAGNOSIS — R7303 Prediabetes: Secondary | ICD-10-CM | POA: Insufficient documentation

## 2023-11-21 NOTE — Progress Notes (Unsigned)
 Subjective:    Patient ID: Randy Moran, male    DOB: 03-25-1961, 63 y.o.   MRN: [id number]     HPI Vikas is here for a physical exam and his chronic medical problems.   The pravastatin  is causing muscle aches - stopped in it march.  On eye drops that also has muscle aches as a side effect - which made this worse.  She would like to avoid statin since the cumulative effect of them in his eyedrops cause increased pain.   Medications and allergies reviewed with patient and updated if appropriate.  Current Outpatient Medications on File Prior to Visit  Medication Sig Dispense Refill   acetaminophen  (TYLENOL ) 500 MG tablet Take 500-1,000 mg by mouth every 6 (six) hours as needed for mild pain, moderate pain or headache.      dorzolamide-timolol (COSOPT) 2-0.5 % ophthalmic solution SMARTSIG:In Eye(s)     ezetimibe  (ZETIA ) 10 MG tablet Take 1 tablet (10 mg total) by mouth daily. 90 tablet 3   folic acid (FOLVITE) 1 MG tablet Take 1 mg by mouth daily.     HUMIRA, 2 PEN, 40 MG/0.4ML pen SMARTSIG:40 Milligram(s) SUB-Q Every 2 Weeks     latanoprost  (XALATAN ) 0.005 % ophthalmic solution Place 1 drop into both eyes at bedtime.      naproxen  sodium (ALEVE ) 220 MG tablet Take 220 mg by mouth.     pravastatin  (PRAVACHOL ) 20 MG tablet Take 1 tablet (20 mg total) by mouth daily. 90 tablet 0   SUMAtriptan  (IMITREX ) 100 MG tablet Take 1 tablet (100 mg total) by mouth every 2 (two) hours as needed for migraine or headache. May repeat in 2 hours if headache persists or recurs. 10 tablet 5   Timolol-Brimon-Dorzol-Bimatopr 0.5-0.1-2-0.01 % SOLN      valACYclovir  (VALTREX ) 1000 MG tablet Take 1 tablet (1,000 mg total) by mouth 2 (two) times daily. 20 tablet 5   zaleplon  (SONATA ) 10 MG capsule Take 1 capsule (10 mg total) by mouth at bedtime as needed for sleep. 30 capsule 5   [DISCONTINUED] chlorproMAZINE (THORAZINE) 10 MG tablet As needed for migraine headache     No current  facility-administered medications on file prior to visit.    Review of Systems  Constitutional:  Negative for fever.  Eyes:  Negative for visual disturbance.  Respiratory:  Negative for cough, shortness of breath and wheezing.   Cardiovascular:  Negative for chest pain, palpitations and leg swelling.  Gastrointestinal:  Negative for abdominal pain, blood in stool, constipation and diarrhea.       No gerd  Genitourinary:  Negative for difficulty urinating, dysuria and hematuria.  Musculoskeletal:  Positive for arthralgias (knee), back pain (spinal stenosis) and joint swelling (knee).  Skin:  Negative for rash.  Neurological:  Negative for light-headedness and headaches.  Psychiatric/Behavioral:  Negative for dysphoric mood. The patient is not nervous/anxious.        Objective:   Vitals:   11/22/23 0807  BP: 118/74  Pulse: 77  Temp: 98.4 F (36.9 C)  SpO2: 99%   Filed Weights   11/22/23 0807  Weight: 159 lb (72.1 kg)   Body mass index is 23.48 kg/m.  BP Readings from Last 3 Encounters:  11/22/23 118/74  10/29/22 116/74  05/21/22 116/80    Wt Readings from Last 3 Encounters:  11/22/23 159 lb (72.1 kg)  10/29/22 166 lb (75.3 kg)  05/21/22 170 lb (77.1 kg)      Physical Exam Constitutional: He appears well-developed  and well-nourished. No distress.  HENT:  Head: Normocephalic and atraumatic.  Right Ear: External ear normal.  Left Ear: External ear normal.  Normal ear canals and TM b/l  Mouth/Throat: Oropharynx is clear and moist. Eyes: Conjunctivae and EOM are normal.  Neck: Neck supple. No tracheal deviation present. No thyromegaly present.  No carotid bruit  Cardiovascular: Normal rate, regular rhythm, normal heart sounds and intact distal pulses.   No murmur heard.  No lower extremity edema. Pulmonary/Chest: Effort normal and breath sounds normal. No respiratory distress. He has no wheezes. He has no rales.  Abdominal: Soft. He exhibits no distension. There  is no tenderness.  Genitourinary: deferred  Lymphadenopathy:   He has no cervical adenopathy.  Skin: Skin is warm and dry. He is not diaphoretic.  Psychiatric: He has a normal mood and affect. His behavior is normal.         Assessment & Plan:   Physical exam: Screening blood work  ordered Exercise   walking, some weights   Weight  normal Substance abuse   none   Reviewed recommended immunizations.  Tdap vaccine and prevnar 13 vaccine today   Health Maintenance  Topic Date Due   Pneumococcal Vaccine 52-79 Years old (1 of 2 - PCV) Never done   DTaP/Tdap/Td (2 - Tdap) 04/19/2019   Colonoscopy  05/18/2023   INFLUENZA VACCINE  01/13/2024   Hepatitis C Screening  Completed   Zoster Vaccines- Shingrix  Completed   HPV VACCINES  Aged Out   Meningococcal B Vaccine  Aged Out   COVID-19 Vaccine  Discontinued   HIV Screening  Discontinued    Will schedule a colonoscopy    See Problem List for Assessment and Plan of chronic medical problems.

## 2023-11-21 NOTE — Patient Instructions (Incomplete)
 Tetanus and prevnar 13 pneumonia vaccine today    Blood work was ordered.       Medications changes include :   Repatha for cholesterol     Return in about 6 months (around 05/23/2024) for follow up.    Health Maintenance, Male Adopting a healthy lifestyle and getting preventive care are important in promoting health and wellness. Ask your health care provider about: The right schedule for you to have regular tests and exams. Things you can do on your own to prevent diseases and keep yourself healthy. What should I know about diet, weight, and exercise? Eat a healthy diet  Eat a diet that includes plenty of vegetables, fruits, low-fat dairy products, and lean protein. Do not eat a lot of foods that are high in solid fats, added sugars, or sodium. Maintain a healthy weight Body mass index (BMI) is a measurement that can be used to identify possible weight problems. It estimates body fat based on height and weight. Your health care provider can help determine your BMI and help you achieve or maintain a healthy weight. Get regular exercise Get regular exercise. This is one of the most important things you can do for your health. Most adults should: Exercise for at least 150 minutes each week. The exercise should increase your heart rate and make you sweat (moderate-intensity exercise). Do strengthening exercises at least twice a week. This is in addition to the moderate-intensity exercise. Spend less time sitting. Even light physical activity can be beneficial. Watch cholesterol and blood lipids Have your blood tested for lipids and cholesterol at 63 years of age, then have this test every 5 years. You may need to have your cholesterol levels checked more often if: Your lipid or cholesterol levels are high. You are older than 63 years of age. You are at high risk for heart disease. What should I know about cancer screening? Many types of cancers can be detected early and may  often be prevented. Depending on your health history and family history, you may need to have cancer screening at various ages. This may include screening for: Colorectal cancer. Prostate cancer. Skin cancer. Lung cancer. What should I know about heart disease, diabetes, and high blood pressure? Blood pressure and heart disease High blood pressure causes heart disease and increases the risk of stroke. This is more likely to develop in people who have high blood pressure readings or are overweight. Talk with your health care provider about your target blood pressure readings. Have your blood pressure checked: Every 3-5 years if you are 49-63 years of age. Every year if you are 103 years old or older. If you are between the ages of 21 and 66 and are a current or former smoker, ask your health care provider if you should have a one-time screening for abdominal aortic aneurysm (AAA). Diabetes Have regular diabetes screenings. This checks your fasting blood sugar level. Have the screening done: Once every three years after age 28 if you are at a normal weight and have a low risk for diabetes. More often and at a younger age if you are overweight or have a high risk for diabetes. What should I know about preventing infection? Hepatitis B If you have a higher risk for hepatitis B, you should be screened for this virus. Talk with your health care provider to find out if you are at risk for hepatitis B infection. Hepatitis C Blood testing is recommended for: Everyone born from [birth year] through 1965.  Anyone with known risk factors for hepatitis C. Sexually transmitted infections (STIs) You should be screened each year for STIs, including gonorrhea and chlamydia, if: You are sexually active and are younger than 63 years of age. You are older than 63 years of age and your health care provider tells you that you are at risk for this type of infection. Your sexual activity has changed since you were last  screened, and you are at increased risk for chlamydia or gonorrhea. Ask your health care provider if you are at risk. Ask your health care provider about whether you are at high risk for HIV. Your health care provider may recommend a prescription medicine to help prevent HIV infection. If you choose to take medicine to prevent HIV, you should first get tested for HIV. You should then be tested every 3 months for as long as you are taking the medicine. Follow these instructions at home: Alcohol use Do not drink alcohol if your health care provider tells you not to drink. If you drink alcohol: Limit how much you have to 0-2 drinks a day. Know how much alcohol is in your drink. In the [place] one drink equals one 12 oz bottle of beer (355 mL), one 5 oz glass of wine (148 mL), or one 1 oz glass of hard liquor (44 mL). Lifestyle Do not use any products that contain nicotine or tobacco. These products include cigarettes, chewing tobacco, and vaping devices, such as e-cigarettes. If you need help quitting, ask your health care provider. Do not use street drugs. Do not share needles. Ask your health care provider for help if you need support or information about quitting drugs. General instructions Schedule regular health, dental, and eye exams. Stay current with your vaccines. Tell your health care provider if: You often feel depressed. You have ever been abused or do not feel safe at home. Summary Adopting a healthy lifestyle and getting preventive care are important in promoting health and wellness. Follow your health care provider's instructions about healthy diet, exercising, and getting tested or screened for diseases. Follow your health care provider's instructions on monitoring your cholesterol and blood pressure. This information is not intended to replace advice given to you by your health care provider. Make sure you discuss any questions you have with your health care provider. Document  Revised: 10/20/2020 Document Reviewed: 10/20/2020 Elsevier Patient Education  2024 [employer].

## 2023-11-21 NOTE — Assessment & Plan Note (Signed)
 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

## 2023-11-22 ENCOUNTER — Ambulatory Visit (INDEPENDENT_AMBULATORY_CARE_PROVIDER_SITE_OTHER): Admitting: Internal Medicine

## 2023-11-22 VITALS — BP 118/74 | HR 77 | Temp 98.4°F | Ht 69.0 in | Wt 159.0 lb

## 2023-11-22 DIAGNOSIS — Z23 Encounter for immunization: Secondary | ICD-10-CM | POA: Diagnosis not present

## 2023-11-22 DIAGNOSIS — R739 Hyperglycemia, unspecified: Secondary | ICD-10-CM | POA: Diagnosis not present

## 2023-11-22 DIAGNOSIS — Z0000 Encounter for general adult medical examination without abnormal findings: Secondary | ICD-10-CM

## 2023-11-22 DIAGNOSIS — G479 Sleep disorder, unspecified: Secondary | ICD-10-CM

## 2023-11-22 DIAGNOSIS — Z125 Encounter for screening for malignant neoplasm of prostate: Secondary | ICD-10-CM

## 2023-11-22 DIAGNOSIS — T466X5A Adverse effect of antihyperlipidemic and antiarteriosclerotic drugs, initial encounter: Secondary | ICD-10-CM

## 2023-11-22 DIAGNOSIS — N401 Enlarged prostate with lower urinary tract symptoms: Secondary | ICD-10-CM

## 2023-11-22 DIAGNOSIS — G43809 Other migraine, not intractable, without status migrainosus: Secondary | ICD-10-CM

## 2023-11-22 DIAGNOSIS — I2510 Atherosclerotic heart disease of native coronary artery without angina pectoris: Secondary | ICD-10-CM | POA: Diagnosis not present

## 2023-11-22 DIAGNOSIS — M609 Myositis, unspecified: Secondary | ICD-10-CM

## 2023-11-22 DIAGNOSIS — E7849 Other hyperlipidemia: Secondary | ICD-10-CM | POA: Diagnosis not present

## 2023-11-22 DIAGNOSIS — N400 Enlarged prostate without lower urinary tract symptoms: Secondary | ICD-10-CM | POA: Insufficient documentation

## 2023-11-22 MED ORDER — REPATHA 140 MG/ML ~~LOC~~ SOSY: 140.0000 mg | PREFILLED_SYRINGE | SUBCUTANEOUS | 5 refills | Status: DC

## 2023-11-22 NOTE — Assessment & Plan Note (Addendum)
 Chronic controlled Continue Sonata  10 mg nightly as needed-encouraged to take as infrequently as possible-he has been eating less and is hoping to not need any additional refills.

## 2023-11-22 NOTE — Assessment & Plan Note (Signed)
Chronic Check a1c Low sugar / carb diet Stressed regular exercise  

## 2023-11-22 NOTE — Assessment & Plan Note (Addendum)
 Chronic CAD Did not tolerate crestor  or pravastatin  On zetia  - LDL not at goal Start Repatha

## 2023-11-22 NOTE — Assessment & Plan Note (Addendum)
 Chronic Has seen uro Did not tolerate flomax and another medication given by urology Currently no obstructive symptoms Can try saw palmetto if needed

## 2023-11-22 NOTE — Addendum Note (Signed)
 Addended by: Katherene Pals on: 11/22/2023 04:33 PM   Modules accepted: Orders

## 2023-11-22 NOTE — Assessment & Plan Note (Signed)
 Chronic No longer seeing neurology Not having many migraines Continue Imitrex  100 mg to use as needed

## 2023-11-22 NOTE — Assessment & Plan Note (Addendum)
 Chronic Intolerance to Crestor  and pravastatin  secondary to muscle aches Lipid panel, CMP Continue  Zetia  10 mg daily Start repatha 140 mg Q 2 weeks

## 2023-11-25 ENCOUNTER — Telehealth: Payer: Self-pay

## 2023-11-25 ENCOUNTER — Other Ambulatory Visit (HOSPITAL_COMMUNITY): Payer: Self-pay

## 2023-11-25 NOTE — Telephone Encounter (Signed)
 Pharmacy Patient Advocate Encounter   Received notification from CoverMyMeds that prior authorization for Repatha  SureClick 140MG /ML auto-injectors  is required/requested.   Insurance verification completed.   The patient is insured through [facility] .   Per test claim: PA required; PA started via CoverMyMeds. KEY [id number] . Waiting for clinical questions to populate.

## 2023-11-27 ENCOUNTER — Ambulatory Visit: Payer: Self-pay | Admitting: Internal Medicine

## 2023-11-28 ENCOUNTER — Encounter: Admitting: Internal Medicine

## 2023-11-28 NOTE — Telephone Encounter (Signed)
 PLEASE BE ADVISED Clinical questions have been answered and PA submitted.TO PLAN. PA currently Pending.

## 2023-11-29 NOTE — Telephone Encounter (Signed)
 Pharmacy Patient Advocate Encounter  Received notification from [facility] that Prior Authorization for Repatha  SureClick 140MG /ML auto-injectors  has been APPROVED from 11/28/2023 to 11/27/2024 PLEASE SEE OUTCOME BELOW   Approved on June 16 by [facility] 2017 Approved. Effective Date: 11/28/2023 Authorization Expiration Date: 11/27/2024  PA #/Case ID/Reference #: [id number]

## 2023-12-08 ENCOUNTER — Encounter: Payer: Self-pay | Admitting: Internal Medicine

## 2023-12-20 DIAGNOSIS — M5416 Radiculopathy, lumbar region: Secondary | ICD-10-CM | POA: Diagnosis not present

## 2023-12-21 DIAGNOSIS — M1711 Unilateral primary osteoarthritis, right knee: Secondary | ICD-10-CM | POA: Diagnosis not present

## 2023-12-22 ENCOUNTER — Ambulatory Visit (AMBULATORY_SURGERY_CENTER)

## 2023-12-22 VITALS — Ht 69.0 in | Wt 160.0 lb

## 2023-12-22 DIAGNOSIS — Z800 Family history of malignant neoplasm of digestive organs: Secondary | ICD-10-CM

## 2023-12-22 MED ORDER — NA SULFATE-K SULFATE-MG SULF 17.5-3.13-1.6 GM/177ML PO SOLN: 1.0000 | Freq: Once | ORAL | 0 refills | Status: AC

## 2023-12-22 NOTE — Progress Notes (Signed)

## 2023-12-29 ENCOUNTER — Encounter: Payer: Self-pay | Admitting: Internal Medicine

## 2024-01-06 DIAGNOSIS — M5459 Other low back pain: Secondary | ICD-10-CM | POA: Diagnosis not present

## 2024-01-12 ENCOUNTER — Encounter: Payer: Self-pay | Admitting: Internal Medicine

## 2024-01-12 ENCOUNTER — Other Ambulatory Visit: Payer: Self-pay

## 2024-01-12 DIAGNOSIS — M609 Myositis, unspecified: Secondary | ICD-10-CM

## 2024-01-12 MED ORDER — EZETIMIBE 10 MG PO TABS: 10.0000 mg | ORAL_TABLET | Freq: Every day | ORAL | 3 refills | Status: AC

## 2024-01-17 DIAGNOSIS — M5416 Radiculopathy, lumbar region: Secondary | ICD-10-CM | POA: Diagnosis not present

## 2024-01-18 ENCOUNTER — Other Ambulatory Visit: Payer: Self-pay | Admitting: Medical Genetics

## 2024-01-23 DIAGNOSIS — M5416 Radiculopathy, lumbar region: Secondary | ICD-10-CM | POA: Diagnosis not present

## 2024-01-23 DIAGNOSIS — S22080A Wedge compression fracture of T11-T12 vertebra, initial encounter for closed fracture: Secondary | ICD-10-CM | POA: Diagnosis not present

## 2024-01-23 DIAGNOSIS — M791 Myalgia, unspecified site: Secondary | ICD-10-CM | POA: Diagnosis not present

## 2024-01-24 NOTE — Progress Notes (Signed)
 [facility] History and Physical   Primary Care Physician:  Geofm Glade PARAS, MD   Reason for Procedure:    Encounter Diagnoses  Name Primary?   Family history of colon cancer in mother - 27's Yes   Family history of malignant neoplasm of gastrointestinal tract      Plan:    Colonoscopy     HPI: Randy Moran is a 63 y.o. male presenting for a repeat screening colonoscopy in the setting of family history.   Past Medical History:  Diagnosis Date   Allergy    Anxiety    ANXIETY 03/22/2007   Qualifier: Diagnosis of  By: Norleen MD, [place] ORN    Asthma    as a child   Cataract    DJD (degenerative joint disease)    GERD (gastroesophageal reflux disease)    in the past   Glaucoma left eye   Hx of colonic polyps    Hyperlipidemia    Insomnia    Lower back pain    Migraines    treated by [facility]   Myofascial pain syndrome    Prostatitis    Psoriatic arthritis (HCC) 03/04/2015   Restless leg syndrome 08/25/2012    Past Surgical History:  Procedure Laterality Date   ABDOMINAL EXPOSURE N/A 07/01/2017   Procedure: ABDOMINAL EXPOSURE;  Surgeon: Oris Krystal FALCON, MD;  Location: [facility] OR;  Service: Vascular;  Laterality: N/A;   ANTERIOR LUMBAR FUSION N/A 07/01/2017   Procedure: Lumbar Five-Sacral one Anterior lumbar interbody fusion;  Surgeon: Colon Shove, MD;  Location: [facility] OR;  Service: Neurosurgery;  Laterality: N/A;   APPENDECTOMY  1967   bilateral inguinal herniorrhaphy     1960's   bilateral shoulder surgery  2006, 2007,2010   COLONOSCOPY     ganglion cyst surgery  2009   HERNIA REPAIR  09/30/11   bilateral inguinal hernia   knee surgery x 2  2004,2008   LUMBAR SPINE SURGERY  07/01/2017   Lumbar Five-Sacral one Anterior lumbar interbody fusion (N/A Spine Lumbar)   UPPER GASTROINTESTINAL ENDOSCOPY       Current Outpatient Medications  Medication Sig Dispense Refill   acetaminophen  (TYLENOL ) 500 MG tablet Take 500-1,000 mg by mouth every 6 (six) hours  as needed for mild pain, moderate pain or headache.      ezetimibe  (ZETIA ) 10 MG tablet Take 1 tablet (10 mg total) by mouth daily. 90 tablet 3   latanoprost  (XALATAN ) 0.005 % ophthalmic solution Place 1 drop into both eyes at bedtime.      REPATHA  SURECLICK 140 MG/ML SOAJ SMARTSIG:140 Milligram(s) SUB-Q Every 2 Weeks     dorzolamide-timolol (COSOPT) 2-0.5 % ophthalmic solution SMARTSIG:In Eye(s) (Patient not taking: Reported on 12/22/2023)     Evolocumab  (REPATHA ) 140 MG/ML SOSY Inject 140 mg into the skin every 14 (fourteen) days. (Patient not taking: Reported on 01/25/2024) 4.2 mL 5   HUMIRA, 2 PEN, 40 MG/0.4ML pen SMARTSIG:40 Milligram(s) SUB-Q Every 2 Weeks     naproxen  sodium (ALEVE ) 220 MG tablet Take 220 mg by mouth. (Patient taking differently: Take 220 mg by mouth daily as needed.)     valACYclovir  (VALTREX ) 1000 MG tablet Take 1 tablet (1,000 mg total) by mouth 2 (two) times daily. (Patient taking differently: Take 1,000 mg by mouth 2 (two) times daily as needed.) 20 tablet 5   Current Facility-Administered Medications  Medication Dose Route Frequency Provider Last Rate Last Admin   0.9 %  sodium chloride  infusion  500 mL Intravenous Continuous  Avram Lupita BRAVO, MD        Allergies as of 01/25/2024 - Review Complete 01/25/2024  Allergen Reaction Noted   Levofloxacin Itching and Rash 09/17/2014   Pravastatin  Other (See Comments) 11/22/2023   Rosuvastatin  Other (See Comments) 06/22/2023    Family History  Problem Relation Age of Onset   Colon cancer Mother 53   COPD Father    Coronary artery disease Other    Hyperlipidemia Other    Hypertension Other    Cancer Maternal Aunt        breast   Cancer Maternal Uncle        prostate   Colon cancer Maternal Uncle    Colon polyps Sister    Esophageal cancer Neg Hx    Rectal cancer Neg Hx    Stomach cancer Neg Hx     Social History   Socioeconomic History   Marital status: Married    Spouse name: Kimberley   Number of  children: 1   Years of education: 14   Highest education level: Tax adviser degree: occupational, Scientist, product/process development, or vocational program  Occupational History   Occupation: Water quality scientist    Employer: [employer]  Tobacco Use   Smoking status: Never   Smokeless tobacco: Never  Vaping Use   Vaping status: Never Used  Substance and Sexual Activity   Alcohol use: No   Drug use: No   Sexual activity: Not on file  Other Topics Concern   Not on file  Social History Narrative   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   Home Situation: lives with wife      Spiritual Beliefs: Sherlean, Baptist      Lifestyle: walks 6 days per week; diet is fair      Caffeine- varies, some days I have none            Social Drivers of Corporate investment banker Strain: Low Risk  (11/20/2023)   Overall Financial Resource Strain (CARDIA)    Difficulty of Paying Living Expenses: Not very hard  Food Insecurity: No Food Insecurity (11/20/2023)   Hunger Vital Sign    Worried About Running Out of Food in the Last Year: Never true    Ran Out of Food in the Last Year: Never true  Transportation Needs: No Transportation Needs (11/20/2023)   PRAPARE - Administrator, Civil Service (Medical): No    Lack of Transportation (Non-Medical): No  Physical Activity: Insufficiently Active (11/20/2023)   Exercise Vital Sign    Days of Exercise per Week: 4 days    Minutes of Exercise per Session: 20 min  Stress: No Stress Concern Present (11/20/2023)   [employer] of Occupational Health - Occupational Stress Questionnaire    Feeling of Stress : Only a little  Social Connections: Moderately Integrated (11/20/2023)   Social Connection and Isolation Panel    Frequency of Communication with Friends and Family: Once a week    Frequency of Social Gatherings with Friends and Family: Once a week    Attends Religious Services: More than 4 times per year    Active Member of [employer]  or Organizations: Yes    Attends Banker Meetings: More than 4 times per year    Marital Status: Married  Intimate Partner Violence: Unknown (09/16/2021)   Received from [facility]   HITS    Physically Hurt: Not on file    Insult or Talk  Down To: Not on file    Threaten Physical Harm: Not on file    Scream or Curse: Not on file    Review of Systems:  All other review of systems negative except as mentioned in the HPI.  Physical Exam: Vital signs BP (!) 135/90   Pulse 67   Temp 97.6 F (36.4 C)   Ht 5' 9 (1.753 m)   Wt 160 lb (72.6 kg)   SpO2 99%   BMI 23.63 kg/m   General:   Alert,  Well-developed, well-nourished, pleasant and cooperative in NAD Lungs:  Clear throughout to auscultation.   Heart:  Regular rate and rhythm; no murmurs, clicks, rubs,  or gallops. Abdomen:  Soft, nontender and nondistended. Normal bowel sounds.   Neuro/Psych:  Alert and cooperative. Normal mood and affect. A and O x 3   @Milianna Ericsson  CHARLENA Commander, MD, [facility] [phone] (pager) 01/25/2024 8:01 AM@

## 2024-01-25 ENCOUNTER — Encounter: Payer: Self-pay | Admitting: Internal Medicine

## 2024-01-25 ENCOUNTER — Ambulatory Visit: Admitting: Internal Medicine

## 2024-01-25 VITALS — BP 129/88 | HR 65 | Temp 97.6°F | Resp 12 | Ht 69.0 in | Wt 160.0 lb

## 2024-01-25 DIAGNOSIS — K5730 Diverticulosis of large intestine without perforation or abscess without bleeding: Secondary | ICD-10-CM

## 2024-01-25 DIAGNOSIS — Z800 Family history of malignant neoplasm of digestive organs: Secondary | ICD-10-CM

## 2024-01-25 DIAGNOSIS — K635 Polyp of colon: Secondary | ICD-10-CM

## 2024-01-25 DIAGNOSIS — K648 Other hemorrhoids: Secondary | ICD-10-CM

## 2024-01-25 DIAGNOSIS — D125 Benign neoplasm of sigmoid colon: Secondary | ICD-10-CM

## 2024-01-25 DIAGNOSIS — Z1211 Encounter for screening for malignant neoplasm of colon: Secondary | ICD-10-CM | POA: Diagnosis not present

## 2024-01-25 MED ORDER — SODIUM CHLORIDE 0.9 % IV SOLN: 500.0000 mL | INTRAVENOUS | Status: DC

## 2024-01-25 NOTE — Op Note (Signed)
 [facility] Patient Name: Randy Moran Procedure Date: 01/25/2024 7:58 AM MRN: [id number] Endoscopist: Lupita FORBES Commander , MD, [id number] Age: 63 Referring MD:  Date of Birth: 14-Jun-1961 Gender: Male Account #: [id number] Procedure:                Colonoscopy Indications:              Screening in patient at increased risk: Colorectal                            cancer in mother before age 32, Last colonoscopy:                            December 2019 Medicines:                Monitored Anesthesia Care Procedure:                Pre-Anesthesia Assessment:                           - Prior to the procedure, a History and Physical                            was performed, and patient medications and                            allergies were reviewed. The patient's tolerance of                            previous anesthesia was also reviewed. The risks                            and benefits of the procedure and the sedation                            options and risks were discussed with the patient.                            All questions were answered, and informed consent                            was obtained. Prior Anticoagulants: The patient has                            taken no anticoagulant or antiplatelet agents. ASA                            Grade Assessment: II - A patient with mild systemic                            disease. After reviewing the risks and benefits,                            the patient was deemed in satisfactory condition to  undergo the procedure.                           After obtaining informed consent, the colonoscope                            was passed under direct vision. Throughout the                            procedure, the patient's blood pressure, pulse, and                            oxygen saturations were monitored continuously. The                            Olympus Scope SN: [id number] was introduced  through                            the anus and advanced to the the cecum, identified                            by appendiceal orifice and ileocecal valve. The                            colonoscopy was performed without difficulty. The                            patient tolerated the procedure well. The quality                            of the bowel preparation was good. The ileocecal                            valve, appendiceal orifice, and rectum were                            photographed. The bowel preparation used was SUPREP                            via split dose instruction. Scope In: 8:07:51 AM Scope Out: 8:22:19 AM Scope Withdrawal Time: 0 hours 10 minutes 57 seconds  Total Procedure Duration: 0 hours 14 minutes 28 seconds  Findings:                 The perianal and digital rectal examinations were                            normal. Pertinent negatives include normal prostate                            (size, shape, and consistency).                           A 4 mm polyp was found in the sigmoid colon. The  polyp was sessile. The polyp was removed with a                            cold snare. Resection and retrieval were complete.                            Verification of patient identification for the                            specimen was done. Estimated blood loss was minimal.                           Multiple small-mouthed diverticula were found in                            the sigmoid colon.                           Internal hemorrhoids were found. The hemorrhoids                            were small.                           The exam was otherwise without abnormality on                            direct and retroflexion views. Complications:            No immediate complications. Estimated Blood Loss:     Estimated blood loss was minimal. Impression:               - One 4 mm polyp in the sigmoid colon, removed with                             a cold snare. Resected and retrieved.                           - Diverticulosis in the sigmoid colon.                           - Internal hemorrhoids.                           - The examination was otherwise normal on direct                            and retroflexion views. Recommendation:           - Patient has a contact number available for                            emergencies. The signs and symptoms of potential                            delayed complications were discussed with the  patient. Return to normal activities tomorrow.                            Written discharge instructions were provided to the                            patient.                           - Resume previous diet.                           - Continue present medications.                           - Await pathology results.                           - Repeat colonoscopy in 5 years. Lupita FORBES Commander, MD 01/25/2024 8:35:31 AM This report has been signed electronically.

## 2024-01-25 NOTE — Progress Notes (Signed)
 Called to room to assist during endoscopic procedure.  Patient ID and intended procedure confirmed with present staff. Received instructions for my participation in the procedure from the performing physician.

## 2024-01-25 NOTE — Progress Notes (Signed)
 Pt's states no medical or surgical changes since previsit or office visit.

## 2024-01-25 NOTE — Progress Notes (Signed)
 Report given to PACU, vss

## 2024-01-25 NOTE — Patient Instructions (Addendum)
 One tiny polyp removed. I will let you know pathology results by mail and/or My Chart.  You also have a condition called diverticulosis - common and not usually a problem. Please read the handout provided. Small internal hemorrhoids also seen - these are a normal finding.  I appreciate the opportunity to care for you. Lupita CHARLENA Commander, MD, FACG   YOU HAD AN ENDOSCOPIC PROCEDURE TODAY AT THE [facility]:   Refer to the procedure report that was given to you for any specific questions about what was found during the examination.  If the procedure report does not answer your questions, please call your gastroenterologist to clarify.  If you requested that your care partner not be given the details of your procedure findings, then the procedure report has been included in a sealed envelope for you to review at your convenience later.  YOU SHOULD EXPECT: Some feelings of bloating in the abdomen. Passage of more gas than usual.  Walking can help get rid of the air that was put into your GI tract during the procedure and reduce the bloating. If you had a lower endoscopy (such as a colonoscopy or flexible sigmoidoscopy) you may notice spotting of blood in your stool or on the toilet paper. If you underwent a bowel prep for your procedure, you may not have a normal bowel movement for a few days.  Please Note:  You might notice some irritation and congestion in your nose or some drainage.  This is from the oxygen used during your procedure.  There is no need for concern and it should clear up in a day or so.  SYMPTOMS TO REPORT IMMEDIATELY:  Following lower endoscopy (colonoscopy or flexible sigmoidoscopy):  Excessive amounts of blood in the stool  Significant tenderness or worsening of abdominal pains  Swelling of the abdomen that is new, acute  Fever of 100F or higher   For urgent or emergent issues, a gastroenterologist can be reached at any hour by calling [phone]. Do not use  MyChart messaging for urgent concerns.    DIET:  We do recommend a small meal at first, but then you may proceed to your regular diet.  Drink plenty of fluids but you should avoid alcoholic beverages for 24 hours.  ACTIVITY:  You should plan to take it easy for the rest of today and you should NOT DRIVE or use heavy machinery until tomorrow (because of the sedation medicines used during the test).    FOLLOW UP: Our staff will call the number listed on your records the next business day following your procedure.  We will call around 7:15- 8:00 am to check on you and address any questions or concerns that you may have regarding the information given to you following your procedure. If we do not reach you, we will leave a message.     If any biopsies were taken you will be contacted by phone or by letter within the next 1-3 weeks.  Please call us  at [phone] if you have not heard about the biopsies in 3 weeks.    SIGNATURES/CONFIDENTIALITY: You and/or your care partner have signed paperwork which will be entered into your electronic medical record.  These signatures attest to the fact that that the information above on your After Visit Summary has been reviewed and is understood.  Full responsibility of the confidentiality of this discharge information lies with you and/or your care-partner.

## 2024-01-26 ENCOUNTER — Telehealth: Payer: Self-pay | Admitting: Lactation Services

## 2024-01-26 NOTE — Telephone Encounter (Signed)
  Follow up Call-     01/25/2024    7:31 AM  Call back number  Post procedure Call Back phone  # [phone]  Permission to leave phone message Yes     Patient questions:  Do you have a fever, pain , or abdominal swelling? No. Pain Score  0 *  Have you tolerated food without any problems? Yes.    Have you been able to return to your normal activities? Yes.    Do you have any questions about your discharge instructions: Diet   No. Medications  No. Follow up visit  No.  Do you have questions or concerns about your Care? No.  Actions: * If pain score is 4 or above: No action needed, pain <4.

## 2024-01-27 LAB — SURGICAL PATHOLOGY

## 2024-01-30 ENCOUNTER — Ambulatory Visit: Payer: Self-pay | Admitting: Internal Medicine

## 2024-01-31 DIAGNOSIS — M791 Myalgia, unspecified site: Secondary | ICD-10-CM | POA: Diagnosis not present

## 2024-02-10 DIAGNOSIS — H401131 Primary open-angle glaucoma, bilateral, mild stage: Secondary | ICD-10-CM | POA: Diagnosis not present

## 2024-02-10 DIAGNOSIS — H04123 Dry eye syndrome of bilateral lacrimal glands: Secondary | ICD-10-CM | POA: Diagnosis not present

## 2024-02-20 DIAGNOSIS — M25561 Pain in right knee: Secondary | ICD-10-CM | POA: Diagnosis not present

## 2024-02-21 ENCOUNTER — Other Ambulatory Visit (HOSPITAL_COMMUNITY)

## 2024-02-23 DIAGNOSIS — M23321 Other meniscus derangements, posterior horn of medial meniscus, right knee: Secondary | ICD-10-CM | POA: Diagnosis not present

## 2024-02-23 DIAGNOSIS — M1711 Unilateral primary osteoarthritis, right knee: Secondary | ICD-10-CM | POA: Diagnosis not present

## 2024-02-28 ENCOUNTER — Encounter: Payer: Self-pay | Admitting: Internal Medicine

## 2024-02-28 ENCOUNTER — Telehealth: Payer: Self-pay | Admitting: Radiology

## 2024-02-28 NOTE — Telephone Encounter (Signed)
 Copied from CRM [id number]. Topic: General - Other >> Feb 28, 2024 11:02 AM Revonda D wrote: Reason for CRM: Elenor with [facility] stated that she faxed over a form to the office in regards to the pt's medical clearance and the labs he needs done before the surgery on 9/12. Elenor stated that she will be re faxing over the clearance forms today due to the pt stating that the office hasn't received the forms yet. Elenor stated that the pt's surgery date is 10/8 and he needs to have the labs and medical clearance appt scheduled.

## 2024-02-28 NOTE — Progress Notes (Unsigned)
 Subjective:    Patient ID: Randy Moran, male    DOB: [birth year], 63 y.o.   MRN: [id number]     HPI Keigen is here for pre-operative clearance at the request of Dr Dempsey Aluisio for right medial partial knee arthroplasty scheduled for 03/21/2024.   Jaxson denies any personal or family history of problems with anesthesia or bleeding/blood clot problems.    Deveion has no concerns and is taking all prescribed medication as prescribed.   Ivey is exercising regularly.  With their daily activities they denies chest pain, palpitations, SOB and lightheadedness.       Medications and allergies reviewed with patient and updated if appropriate.  Current Outpatient Medications on File Prior to Visit  Medication Sig Dispense Refill   acetaminophen  (TYLENOL ) 500 MG tablet Take 500-1,000 mg by mouth every 6 (six) hours as needed for mild pain, moderate pain or headache.      brimonidine-timolol (COMBIGAN) 0.2-0.5 % ophthalmic solution INSTILL 1 DROP INTO BOTH EYE TWICE DAILY     ezetimibe  (ZETIA ) 10 MG tablet Take 1 tablet (10 mg total) by mouth daily. 90 tablet 3   latanoprost  (XALATAN ) 0.005 % ophthalmic solution Place 1 drop into both eyes at bedtime.      naproxen  sodium (ALEVE ) 220 MG tablet Take 220 mg by mouth. (Patient taking differently: Take 220 mg by mouth daily as needed.)     REPATHA  SURECLICK 140 MG/ML SOAJ SMARTSIG:140 Milligram(s) SUB-Q Every 2 Weeks     valACYclovir  (VALTREX ) 1000 MG tablet Take 1 tablet (1,000 mg total) by mouth 2 (two) times daily. (Patient taking differently: Take 1,000 mg by mouth 2 (two) times daily as needed.) 20 tablet 5   [DISCONTINUED] chlorproMAZINE (THORAZINE) 10 MG tablet As needed for migraine headache     No current facility-administered medications on file prior to visit.    Review of Systems  Constitutional:  Negative for fever.  Respiratory:  Negative for cough, shortness of breath and wheezing.   Cardiovascular:  Negative for  chest pain, palpitations and leg swelling.  Gastrointestinal:  Negative for abdominal pain, blood in stool, constipation and diarrhea.       No gerd  Genitourinary:  Negative for difficulty urinating, dysuria and hematuria.  Musculoskeletal:  Positive for arthralgias.  Skin:  Negative for rash.  Neurological:  Negative for dizziness, light-headedness and headaches.       Objective:   Vitals:   02/29/24 0751  BP: 104/78  Pulse: 83  Temp: 98.3 F (36.8 C)  SpO2: 98%   Filed Weights   02/29/24 0751  Weight: 164 lb (74.4 kg)   Body mass index is 24.22 kg/m.  BP Readings from Last 3 Encounters:  02/29/24 104/78  01/25/24 129/88  11/22/23 118/74    Wt Readings from Last 3 Encounters:  02/29/24 164 lb (74.4 kg)  01/25/24 160 lb (72.6 kg)  12/22/23 160 lb (72.6 kg)      Physical Exam Constitutional: He appears well-developed and well-nourished. No distress.  HENT:  Head: Normocephalic and atraumatic.  Right Ear: External ear normal.  Left Ear: External ear normal.  Mouth/Throat: Oropharynx is clear and moist. Eyes: Conjunctivae and EOM are normal.  Neck: Neck supple. No tracheal deviation present. No thyromegaly present.  No carotid bruit  Cardiovascular: Normal rate, regular rhythm, normal heart sounds and intact distal pulses.   No murmur heard.  No lower extremity edema. Pulmonary/Chest: Effort normal and breath sounds normal. No respiratory distress. He has no wheezes. He  has no rales.  Abdominal: Soft. He exhibits no distension. There is no tenderness.  Lymphadenopathy:   He has no cervical adenopathy.  Skin: Skin is warm and dry. He is not diaphoretic.  Psychiatric: He has a normal mood and affect. His behavior is normal.    EKG - NSR @ 61 bpm, normal EKG.  No change compared to EKG from 2022.     Assessment & Plan:     See Problem List for Assessment and Plan of chronic medical problems.

## 2024-02-28 NOTE — Telephone Encounter (Signed)
 Form received and patient coming in the morning for clearance and labs.

## 2024-02-28 NOTE — Patient Instructions (Signed)
      Blood work was ordered.   An EKG was done    Medications changes include :   None    You are low risk for surgery - we will let Dr Aluisio's office know.

## 2024-02-29 ENCOUNTER — Ambulatory Visit (INDEPENDENT_AMBULATORY_CARE_PROVIDER_SITE_OTHER): Admitting: Internal Medicine

## 2024-02-29 VITALS — BP 104/78 | HR 83 | Temp 98.3°F | Ht 69.0 in | Wt 164.0 lb

## 2024-02-29 DIAGNOSIS — R739 Hyperglycemia, unspecified: Secondary | ICD-10-CM

## 2024-02-29 DIAGNOSIS — E7849 Other hyperlipidemia: Secondary | ICD-10-CM | POA: Diagnosis not present

## 2024-02-29 DIAGNOSIS — L4050 Arthropathic psoriasis, unspecified: Secondary | ICD-10-CM

## 2024-02-29 DIAGNOSIS — B009 Herpesviral infection, unspecified: Secondary | ICD-10-CM

## 2024-02-29 DIAGNOSIS — I2510 Atherosclerotic heart disease of native coronary artery without angina pectoris: Secondary | ICD-10-CM

## 2024-02-29 DIAGNOSIS — Z01818 Encounter for other preprocedural examination: Secondary | ICD-10-CM | POA: Diagnosis not present

## 2024-02-29 LAB — CBC WITH DIFFERENTIAL/PLATELET
Basophils Absolute: 0 K/uL (ref 0.0–0.1)
Basophils Relative: 0.5 % (ref 0.0–3.0)
Eosinophils Absolute: 0.3 K/uL (ref 0.0–0.7)
Eosinophils Relative: 6.1 % — ABNORMAL HIGH (ref 0.0–5.0)
HCT: 40.3 % (ref 39.0–52.0)
Hemoglobin: 13.7 g/dL (ref 13.0–17.0)
Lymphocytes Relative: 47.3 % — ABNORMAL HIGH (ref 12.0–46.0)
Lymphs Abs: 2.1 K/uL (ref 0.7–4.0)
MCHC: 33.9 g/dL (ref 30.0–36.0)
MCV: 95 fl (ref 78.0–100.0)
Monocytes Absolute: 0.4 K/uL (ref 0.1–1.0)
Monocytes Relative: 9 % (ref 3.0–12.0)
Neutro Abs: 1.7 K/uL (ref 1.4–7.7)
Neutrophils Relative %: 37.1 % — ABNORMAL LOW (ref 43.0–77.0)
Platelets: 205 K/uL (ref 150.0–400.0)
RBC: 4.24 Mil/uL (ref 4.22–5.81)
RDW: 13 % (ref 11.5–15.5)
WBC: 4.5 K/uL (ref 4.0–10.5)

## 2024-02-29 LAB — COMPREHENSIVE METABOLIC PANEL WITH GFR
ALT: 20 U/L (ref 0–53)
AST: 19 U/L (ref 0–37)
Albumin: 4.1 g/dL (ref 3.5–5.2)
Alkaline Phosphatase: 41 U/L (ref 39–117)
BUN: 15 mg/dL (ref 6–23)
CO2: 31 meq/L (ref 19–32)
Calcium: 9.1 mg/dL (ref 8.4–10.5)
Chloride: 102 meq/L (ref 96–112)
Creatinine, Ser: 0.99 mg/dL (ref 0.40–1.50)
GFR: 81.41 mL/min (ref >60.00)
Glucose, Bld: 97 mg/dL (ref 70–99)
Potassium: 4.1 meq/L (ref 3.5–5.1)
Sodium: 139 meq/L (ref 135–145)
Total Bilirubin: 0.6 mg/dL (ref 0.2–1.2)
Total Protein: 6.2 g/dL (ref 6.0–8.3)

## 2024-02-29 LAB — LIPID PANEL
Cholesterol: 204 mg/dL — ABNORMAL HIGH (ref 0–200)
HDL: 55.2 mg/dL (ref >39.00)
LDL Cholesterol: 131 mg/dL — ABNORMAL HIGH (ref 0–99)
NonHDL: 148.84
Total CHOL/HDL Ratio: 4
Triglycerides: 88 mg/dL (ref 0.0–149.0)
VLDL: 17.6 mg/dL (ref 0.0–40.0)

## 2024-02-29 LAB — HEMOGLOBIN A1C: Hgb A1c MFr Bld: 5.9 % (ref 4.6–6.5)

## 2024-02-29 NOTE — Assessment & Plan Note (Addendum)
 Here for preop clearance for right medial partial knee arthroplasty Chronic medical problems stable No concerning symptoms to suggest angina, no respiratory disease EKG is normal, blood work today Low risk for planned procedure

## 2024-02-29 NOTE — Assessment & Plan Note (Addendum)
 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

## 2024-02-29 NOTE — Assessment & Plan Note (Signed)
 Chronic Was on methotrexate and humira Not currently on any medications Management per Dr. Mai

## 2024-02-29 NOTE — Assessment & Plan Note (Signed)
 Chronic Lab Results  Component Value Date   HGBA1C 5.5 11/22/2023   Check a1c Low sugar / carb diet Stressed regular exercise

## 2024-02-29 NOTE — Assessment & Plan Note (Signed)
 Chronic Occasional outbreaks Continue valtrex  2000 mg Q12 prn x 1 day

## 2024-02-29 NOTE — Assessment & Plan Note (Addendum)
 Chronic Has mild CAD Intolerance to Crestor  and pravastatin  secondary to muscle aches Continue  Zetia  10 mg daily, repatha  140 mg Q 2 weeks ( has not been taking regularly) Check lipid panel-has not had this done since starting Repatha 

## 2024-03-01 ENCOUNTER — Ambulatory Visit: Payer: Self-pay | Admitting: Internal Medicine

## 2024-03-12 ENCOUNTER — Other Ambulatory Visit

## 2024-03-13 DIAGNOSIS — M25561 Pain in right knee: Secondary | ICD-10-CM | POA: Diagnosis not present

## 2024-03-13 DIAGNOSIS — M1711 Unilateral primary osteoarthritis, right knee: Secondary | ICD-10-CM | POA: Diagnosis not present

## 2024-03-21 DIAGNOSIS — M1711 Unilateral primary osteoarthritis, right knee: Secondary | ICD-10-CM | POA: Diagnosis not present

## 2024-03-21 DIAGNOSIS — M25761 Osteophyte, right knee: Secondary | ICD-10-CM | POA: Diagnosis not present

## 2024-04-05 DIAGNOSIS — M25561 Pain in right knee: Secondary | ICD-10-CM | POA: Diagnosis not present

## 2024-04-10 ENCOUNTER — Other Ambulatory Visit: Payer: Self-pay | Admitting: Medical Genetics

## 2024-04-10 DIAGNOSIS — Z006 Encounter for examination for normal comparison and control in clinical research program: Secondary | ICD-10-CM

## 2024-04-17 DIAGNOSIS — M25561 Pain in right knee: Secondary | ICD-10-CM | POA: Diagnosis not present

## 2024-04-19 DIAGNOSIS — M25561 Pain in right knee: Secondary | ICD-10-CM | POA: Diagnosis not present

## 2024-05-01 DIAGNOSIS — Z5189 Encounter for other specified aftercare: Secondary | ICD-10-CM | POA: Diagnosis not present

## 2024-05-14 ENCOUNTER — Encounter: Payer: Self-pay | Admitting: Internal Medicine

## 2024-05-14 DIAGNOSIS — H401131 Primary open-angle glaucoma, bilateral, mild stage: Secondary | ICD-10-CM | POA: Diagnosis not present

## 2024-05-14 DIAGNOSIS — H04123 Dry eye syndrome of bilateral lacrimal glands: Secondary | ICD-10-CM | POA: Diagnosis not present

## 2024-05-15 DIAGNOSIS — L4050 Arthropathic psoriasis, unspecified: Secondary | ICD-10-CM | POA: Diagnosis not present

## 2024-05-15 DIAGNOSIS — Z79899 Other long term (current) drug therapy: Secondary | ICD-10-CM | POA: Diagnosis not present

## 2024-05-15 DIAGNOSIS — R5382 Chronic fatigue, unspecified: Secondary | ICD-10-CM | POA: Diagnosis not present

## 2024-05-15 DIAGNOSIS — L409 Psoriasis, unspecified: Secondary | ICD-10-CM | POA: Diagnosis not present

## 2024-05-21 ENCOUNTER — Encounter: Payer: Self-pay | Admitting: Internal Medicine

## 2024-05-22 ENCOUNTER — Encounter: Payer: Self-pay | Admitting: Internal Medicine

## 2024-05-22 NOTE — Patient Instructions (Addendum)
      Blood work was ordered.       Medications changes include :   None    A referral was ordered and someone will call you to schedule an appointment.     Return in about 1 year (around 05/23/2025) for Physical Exam.

## 2024-05-22 NOTE — Progress Notes (Unsigned)
      Subjective:    Patient ID: Randy Moran, male    DOB: Feb 16, 1961, 62 y.o.   MRN: [id number]     HPI Randy Moran is here for follow up of his chronic medical problems.   ? Q mo or 1 yr Medications and allergies reviewed with patient and updated if appropriate.  Current Outpatient Medications on File Prior to Visit  Medication Sig Dispense Refill   acetaminophen  (TYLENOL ) 500 MG tablet Take 500-1,000 mg by mouth every 6 (six) hours as needed for mild pain, moderate pain or headache.      brimonidine-timolol (COMBIGAN) 0.2-0.5 % ophthalmic solution INSTILL 1 DROP INTO BOTH EYE TWICE DAILY     ezetimibe  (ZETIA ) 10 MG tablet Take 1 tablet (10 mg total) by mouth daily. 90 tablet 3   latanoprost  (XALATAN ) 0.005 % ophthalmic solution Place 1 drop into both eyes at bedtime.      naproxen  sodium (ALEVE ) 220 MG tablet Take 220 mg by mouth. (Patient taking differently: Take 220 mg by mouth daily as needed.)     REPATHA  SURECLICK 140 MG/ML SOAJ SMARTSIG:140 Milligram(s) SUB-Q Every 2 Weeks     saw palmetto 500 MG capsule Take 500 mg by mouth daily.     valACYclovir  (VALTREX ) 1000 MG tablet Take 1 tablet (1,000 mg total) by mouth 2 (two) times daily. (Patient taking differently: Take 1,000 mg by mouth 2 (two) times daily as needed.) 20 tablet 5   [DISCONTINUED] chlorproMAZINE (THORAZINE) 10 MG tablet As needed for migraine headache     No current facility-administered medications on file prior to visit.     Review of Systems     Objective:  There were no vitals filed for this visit. BP Readings from Last 3 Encounters:  02/29/24 104/78  01/25/24 129/88  11/22/23 118/74   Wt Readings from Last 3 Encounters:  02/29/24 164 lb (74.4 kg)  01/25/24 160 lb (72.6 kg)  12/22/23 160 lb (72.6 kg)   There is no height or weight on file to calculate BMI.    Physical Exam     Lab Results  Component Value Date   WBC 4.5 02/29/2024   HGB 13.7 02/29/2024   HCT 40.3 02/29/2024   PLT  205.0 02/29/2024   GLUCOSE 97 02/29/2024   CHOL 204 (H) 02/29/2024   TRIG 88.0 02/29/2024   HDL 55.20 02/29/2024   LDLDIRECT 135.3 08/24/2012   LDLCALC 131 (H) 02/29/2024   ALT 20 02/29/2024   AST 19 02/29/2024   NA 139 02/29/2024   K 4.1 02/29/2024   CL 102 02/29/2024   CREATININE 0.99 02/29/2024   BUN 15 02/29/2024   CO2 31 02/29/2024   TSH 1.53 11/22/2023   PSA 1.79 11/22/2023   HGBA1C 5.9 02/29/2024     Assessment & Plan:    See Problem List for Assessment and Plan of chronic medical problems.

## 2024-05-23 ENCOUNTER — Ambulatory Visit: Admitting: Internal Medicine

## 2024-05-23 ENCOUNTER — Ambulatory Visit: Payer: Self-pay | Admitting: Internal Medicine

## 2024-05-23 VITALS — BP 142/88 | HR 104 | Temp 98.3°F | Ht 69.0 in | Wt 163.0 lb

## 2024-05-23 DIAGNOSIS — Z96651 Presence of right artificial knee joint: Secondary | ICD-10-CM | POA: Insufficient documentation

## 2024-05-23 DIAGNOSIS — L4050 Arthropathic psoriasis, unspecified: Secondary | ICD-10-CM | POA: Diagnosis not present

## 2024-05-23 DIAGNOSIS — E7800 Pure hypercholesterolemia, unspecified: Secondary | ICD-10-CM

## 2024-05-23 DIAGNOSIS — Z125 Encounter for screening for malignant neoplasm of prostate: Secondary | ICD-10-CM

## 2024-05-23 DIAGNOSIS — I2510 Atherosclerotic heart disease of native coronary artery without angina pectoris: Secondary | ICD-10-CM

## 2024-05-23 DIAGNOSIS — R7303 Prediabetes: Secondary | ICD-10-CM | POA: Diagnosis not present

## 2024-05-23 DIAGNOSIS — R030 Elevated blood-pressure reading, without diagnosis of hypertension: Secondary | ICD-10-CM

## 2024-05-23 DIAGNOSIS — B009 Herpesviral infection, unspecified: Secondary | ICD-10-CM

## 2024-05-23 DIAGNOSIS — G43809 Other migraine, not intractable, without status migrainosus: Secondary | ICD-10-CM

## 2024-05-23 DIAGNOSIS — Z23 Encounter for immunization: Secondary | ICD-10-CM

## 2024-05-23 LAB — COMPREHENSIVE METABOLIC PANEL WITH GFR
ALT: 23 U/L (ref 0–53)
AST: 22 U/L (ref 0–37)
Albumin: 4.6 g/dL (ref 3.5–5.2)
Alkaline Phosphatase: 57 U/L (ref 39–117)
BUN: 19 mg/dL (ref 6–23)
CO2: 30 meq/L (ref 19–32)
Calcium: 9.4 mg/dL (ref 8.4–10.5)
Chloride: 102 meq/L (ref 96–112)
Creatinine, Ser: 0.9 mg/dL (ref 0.40–1.50)
GFR: 91.13 mL/min (ref >60.00)
Glucose, Bld: 103 mg/dL — ABNORMAL HIGH (ref 70–99)
Potassium: 3.7 meq/L (ref 3.5–5.1)
Sodium: 140 meq/L (ref 135–145)
Total Bilirubin: 0.6 mg/dL (ref 0.2–1.2)
Total Protein: 6.9 g/dL (ref 6.0–8.3)

## 2024-05-23 LAB — HEMOGLOBIN A1C: Hgb A1c MFr Bld: 5.3 % (ref 4.6–6.5)

## 2024-05-23 LAB — CBC
HCT: 40.5 % (ref 39.0–52.0)
Hemoglobin: 14 g/dL (ref 13.0–17.0)
MCHC: 34.7 g/dL (ref 30.0–36.0)
MCV: 96.6 fl (ref 78.0–100.0)
Platelets: 221 K/uL (ref 150.0–400.0)
RBC: 4.19 Mil/uL — ABNORMAL LOW (ref 4.22–5.81)
RDW: 13.6 % (ref 11.5–15.5)
WBC: 9.1 K/uL (ref 4.0–10.5)

## 2024-05-23 LAB — LIPID PANEL
Cholesterol: 160 mg/dL (ref 0–200)
HDL: 92.6 mg/dL (ref >39.00)
LDL Cholesterol: 51 mg/dL (ref 0–99)
NonHDL: 67.77
Total CHOL/HDL Ratio: 2
Triglycerides: 83 mg/dL (ref 0.0–149.0)
VLDL: 16.6 mg/dL (ref 0.0–40.0)

## 2024-05-23 LAB — PSA, MEDICARE: PSA: 1.85 ng/mL (ref 0.10–4.00)

## 2024-05-23 LAB — TSH: TSH: 0.95 u[IU]/mL (ref 0.35–5.50)

## 2024-05-23 MED ORDER — VALACYCLOVIR HCL 1 G PO TABS: 1000.0000 mg | ORAL_TABLET | Freq: Two times a day (BID) | ORAL | 5 refills | Status: AC

## 2024-05-23 NOTE — Assessment & Plan Note (Signed)
 Chronic No longer seeing neurology Not having many migraines

## 2024-05-23 NOTE — Assessment & Plan Note (Addendum)
 Acute BP elevated - just started prednisone  which is likely the cause Pressure typically well-controlled No medication needed at this time Monitor BP

## 2024-05-23 NOTE — Assessment & Plan Note (Signed)
 Chronic Was on methotrexate and humira Not currently on any medications Will likely need to go back on medication next month Management per Dr. Mai

## 2024-05-23 NOTE — Assessment & Plan Note (Signed)
 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

## 2024-05-23 NOTE — Assessment & Plan Note (Signed)
 Chronic Lab Results  Component Value Date   HGBA1C 5.9 02/29/2024   Check a1c Low sugar / carb diet Stressed regular exercise

## 2024-05-23 NOTE — Assessment & Plan Note (Signed)
 Chronic Has mild CAD Intolerance to Crestor  and pravastatin  secondary to muscle aches Continue  Zetia  10 mg daily, repatha  140 mg Q 2 weeks ( has not been taking regularly) Check lipids, cmp, tsh

## 2024-05-23 NOTE — Assessment & Plan Note (Signed)
 Chronic Occasional outbreaks Continue valtrex  2000 mg Q12 prn x 1 day

## 2024-05-29 DIAGNOSIS — M1712 Unilateral primary osteoarthritis, left knee: Secondary | ICD-10-CM | POA: Diagnosis not present

## 2024-05-29 DIAGNOSIS — M5459 Other low back pain: Secondary | ICD-10-CM | POA: Diagnosis not present

## 2024-06-11 ENCOUNTER — Other Ambulatory Visit: Payer: Self-pay | Admitting: Internal Medicine

## 2024-06-29 ENCOUNTER — Encounter: Payer: Self-pay | Admitting: Internal Medicine

## 2025-05-27 ENCOUNTER — Encounter: Admitting: Internal Medicine
